# Patient Record
Sex: Female | Born: 1935 | Race: Black or African American | Hispanic: No | Marital: Married | Smoking: Never smoker
Health system: Southern US, Community
[De-identification: ages and names within clinical notes are randomized; demographics above are authoritative.]

## PROBLEM LIST (undated history)

## (undated) DIAGNOSIS — I639 Cerebral infarction, unspecified: Secondary | ICD-10-CM

## (undated) DIAGNOSIS — R05 Cough: Secondary | ICD-10-CM

## (undated) DIAGNOSIS — S01119A Laceration without foreign body of unspecified eyelid and periocular area, initial encounter: Secondary | ICD-10-CM

## (undated) DIAGNOSIS — I10 Essential (primary) hypertension: Secondary | ICD-10-CM

## (undated) DIAGNOSIS — F411 Generalized anxiety disorder: Secondary | ICD-10-CM

## (undated) DIAGNOSIS — R296 Repeated falls: Secondary | ICD-10-CM

## (undated) DIAGNOSIS — Z993 Dependence on wheelchair: Secondary | ICD-10-CM

## (undated) DIAGNOSIS — Z029 Encounter for administrative examinations, unspecified: Secondary | ICD-10-CM

## (undated) DIAGNOSIS — E785 Hyperlipidemia, unspecified: Secondary | ICD-10-CM

## (undated) DIAGNOSIS — F02818 Dementia in other diseases classified elsewhere, unspecified severity, with other behavioral disturbance: Secondary | ICD-10-CM

## (undated) DIAGNOSIS — K2100 Gastro-esophageal reflux disease with esophagitis, without bleeding: Secondary | ICD-10-CM

## (undated) DIAGNOSIS — R5381 Other malaise: Secondary | ICD-10-CM

## (undated) DIAGNOSIS — R5383 Other fatigue: Secondary | ICD-10-CM

## (undated) DIAGNOSIS — R059 Cough, unspecified: Secondary | ICD-10-CM

## (undated) DIAGNOSIS — N289 Disorder of kidney and ureter, unspecified: Secondary | ICD-10-CM

## (undated) DIAGNOSIS — I6990 Unspecified sequelae of unspecified cerebrovascular disease: Secondary | ICD-10-CM

## (undated) DIAGNOSIS — R251 Tremor, unspecified: Secondary | ICD-10-CM

## (undated) DIAGNOSIS — E876 Hypokalemia: Secondary | ICD-10-CM

## (undated) DIAGNOSIS — E559 Vitamin D deficiency, unspecified: Secondary | ICD-10-CM

## (undated) DIAGNOSIS — F0390 Unspecified dementia without behavioral disturbance: Secondary | ICD-10-CM

## (undated) DIAGNOSIS — Z0000 Encounter for general adult medical examination without abnormal findings: Secondary | ICD-10-CM

## (undated) DIAGNOSIS — F0281 Dementia in other diseases classified elsewhere with behavioral disturbance: Secondary | ICD-10-CM

## (undated) DIAGNOSIS — R269 Unspecified abnormalities of gait and mobility: Secondary | ICD-10-CM

## (undated) DIAGNOSIS — F0280 Dementia in other diseases classified elsewhere without behavioral disturbance: Secondary | ICD-10-CM

## (undated) DIAGNOSIS — S06339A Contusion and laceration of cerebrum, unspecified, with loss of consciousness of unspecified duration, initial encounter: Secondary | ICD-10-CM

## (undated) DIAGNOSIS — K921 Melena: Secondary | ICD-10-CM

## (undated) DIAGNOSIS — D696 Thrombocytopenia, unspecified: Secondary | ICD-10-CM

## (undated) DIAGNOSIS — R634 Abnormal weight loss: Secondary | ICD-10-CM

## (undated) HISTORY — DX: Tremor, unspecified: R25.1

## (undated) HISTORY — DX: Thrombocytopenia, unspecified: D69.6

## (undated) HISTORY — DX: Gastro-esophageal reflux disease with esophagitis: K21.0

## (undated) HISTORY — DX: Other malaise: R53.83

## (undated) HISTORY — DX: Abnormal weight loss: R63.4

## (undated) HISTORY — DX: Vitamin D deficiency, unspecified: E55.9

## (undated) HISTORY — DX: Essential (primary) hypertension: I10

## (undated) HISTORY — DX: Generalized anxiety disorder: F41.1

## (undated) HISTORY — DX: Other malaise: R53.81

## (undated) HISTORY — DX: Encounter for administrative examinations, unspecified: Z02.9

## (undated) HISTORY — DX: Dementia in other diseases classified elsewhere, unspecified severity, without behavioral disturbance, psychotic disturbance, mood disturbance, and anxiety: F02.80

## (undated) HISTORY — DX: Repeated falls: R29.6

## (undated) HISTORY — DX: Unspecified abnormalities of gait and mobility: R26.9

## (undated) HISTORY — DX: Laceration without foreign body of unspecified eyelid and periocular area, initial encounter: S01.119A

## (undated) HISTORY — DX: Dependence on wheelchair: Z99.3

## (undated) HISTORY — DX: Hypokalemia: E87.6

## (undated) HISTORY — DX: Gastro-esophageal reflux disease with esophagitis, without bleeding: K21.00

## (undated) HISTORY — DX: Cough, unspecified: R05.9

## (undated) HISTORY — DX: Dementia in other diseases classified elsewhere, unspecified severity, with other behavioral disturbance: F02.818

## (undated) HISTORY — DX: Dementia in other diseases classified elsewhere with behavioral disturbance: F02.81

## (undated) HISTORY — DX: Melena: K92.1

## (undated) HISTORY — DX: Cough: R05

## (undated) HISTORY — DX: Disorder of kidney and ureter, unspecified: N28.9

## (undated) HISTORY — DX: Hyperlipidemia, unspecified: E78.5

## (undated) HISTORY — DX: Unspecified sequelae of unspecified cerebrovascular disease: I69.90

## (undated) HISTORY — DX: Encounter for general adult medical examination without abnormal findings: Z00.00

## (undated) HISTORY — DX: Unspecified dementia, unspecified severity, without behavioral disturbance, psychotic disturbance, mood disturbance, and anxiety: F03.90

## (undated) HISTORY — DX: Contusion and laceration of cerebrum, unspecified, with loss of consciousness of unspecified duration, initial encounter: S06.339A

---

## 1979-03-26 HISTORY — PX: ABDOMINAL HYSTERECTOMY: SHX81

## 1999-05-28 ENCOUNTER — Ambulatory Visit (HOSPITAL_COMMUNITY): Admission: RE | Admit: 1999-05-28 | Discharge: 1999-05-28 | Payer: Self-pay | Admitting: Family Medicine

## 1999-05-28 ENCOUNTER — Encounter: Payer: Self-pay | Admitting: Family Medicine

## 2000-06-25 ENCOUNTER — Encounter: Payer: Self-pay | Admitting: Emergency Medicine

## 2000-06-25 ENCOUNTER — Emergency Department (HOSPITAL_COMMUNITY): Admission: EM | Admit: 2000-06-25 | Discharge: 2000-06-25 | Payer: Self-pay | Admitting: Emergency Medicine

## 2001-09-09 ENCOUNTER — Encounter: Payer: Self-pay | Admitting: Emergency Medicine

## 2001-09-09 ENCOUNTER — Emergency Department (HOSPITAL_COMMUNITY): Admission: EM | Admit: 2001-09-09 | Discharge: 2001-09-09 | Payer: Self-pay | Admitting: Emergency Medicine

## 2002-09-20 ENCOUNTER — Ambulatory Visit (HOSPITAL_COMMUNITY): Admission: RE | Admit: 2002-09-20 | Discharge: 2002-09-20 | Payer: Self-pay | Admitting: Family Medicine

## 2002-09-20 ENCOUNTER — Encounter: Payer: Self-pay | Admitting: Family Medicine

## 2003-05-26 ENCOUNTER — Encounter: Admission: RE | Admit: 2003-05-26 | Discharge: 2003-05-26 | Payer: Self-pay | Admitting: Family Medicine

## 2003-06-08 ENCOUNTER — Ambulatory Visit (HOSPITAL_COMMUNITY): Admission: RE | Admit: 2003-06-08 | Discharge: 2003-06-08 | Payer: Self-pay | Admitting: General Surgery

## 2003-06-08 ENCOUNTER — Encounter (INDEPENDENT_AMBULATORY_CARE_PROVIDER_SITE_OTHER): Payer: Self-pay | Admitting: *Deleted

## 2004-01-28 ENCOUNTER — Inpatient Hospital Stay (HOSPITAL_COMMUNITY): Admission: EM | Admit: 2004-01-28 | Discharge: 2004-02-01 | Payer: Self-pay | Admitting: Emergency Medicine

## 2004-11-26 ENCOUNTER — Emergency Department (HOSPITAL_COMMUNITY): Admission: EM | Admit: 2004-11-26 | Discharge: 2004-11-27 | Payer: Self-pay | Admitting: Emergency Medicine

## 2004-12-10 ENCOUNTER — Ambulatory Visit (HOSPITAL_COMMUNITY): Admission: RE | Admit: 2004-12-10 | Discharge: 2004-12-10 | Payer: Self-pay | Admitting: Specialist

## 2004-12-10 ENCOUNTER — Encounter (INDEPENDENT_AMBULATORY_CARE_PROVIDER_SITE_OTHER): Payer: Self-pay | Admitting: *Deleted

## 2004-12-10 ENCOUNTER — Ambulatory Visit (HOSPITAL_BASED_OUTPATIENT_CLINIC_OR_DEPARTMENT_OTHER): Admission: RE | Admit: 2004-12-10 | Discharge: 2004-12-10 | Payer: Self-pay | Admitting: Specialist

## 2006-03-25 HISTORY — PX: JOINT REPLACEMENT: SHX530

## 2006-06-10 ENCOUNTER — Encounter: Admission: RE | Admit: 2006-06-10 | Discharge: 2006-06-10 | Payer: Self-pay | Admitting: Family Medicine

## 2006-08-26 ENCOUNTER — Inpatient Hospital Stay (HOSPITAL_COMMUNITY): Admission: RE | Admit: 2006-08-26 | Discharge: 2006-08-29 | Payer: Self-pay | Admitting: Orthopedic Surgery

## 2007-04-18 ENCOUNTER — Inpatient Hospital Stay (HOSPITAL_COMMUNITY): Admission: EM | Admit: 2007-04-18 | Discharge: 2007-04-22 | Payer: Self-pay | Admitting: Emergency Medicine

## 2007-04-21 ENCOUNTER — Ambulatory Visit: Payer: Self-pay | Admitting: Physical Medicine & Rehabilitation

## 2007-04-22 ENCOUNTER — Inpatient Hospital Stay (HOSPITAL_COMMUNITY)
Admission: RE | Admit: 2007-04-22 | Discharge: 2007-05-08 | Payer: Self-pay | Admitting: Physical Medicine & Rehabilitation

## 2007-04-22 ENCOUNTER — Ambulatory Visit: Payer: Self-pay | Admitting: Physical Medicine & Rehabilitation

## 2007-05-14 ENCOUNTER — Encounter
Admission: RE | Admit: 2007-05-14 | Discharge: 2007-07-09 | Payer: Self-pay | Admitting: Physical Medicine & Rehabilitation

## 2007-05-20 ENCOUNTER — Encounter: Admission: RE | Admit: 2007-05-20 | Discharge: 2007-05-20 | Payer: Self-pay | Admitting: Neurology

## 2007-06-04 ENCOUNTER — Ambulatory Visit: Payer: Self-pay | Admitting: Physical Medicine & Rehabilitation

## 2007-06-04 ENCOUNTER — Encounter
Admission: RE | Admit: 2007-06-04 | Discharge: 2007-06-05 | Payer: Self-pay | Admitting: Physical Medicine & Rehabilitation

## 2007-07-31 ENCOUNTER — Ambulatory Visit: Payer: Self-pay | Admitting: Physical Medicine & Rehabilitation

## 2007-07-31 ENCOUNTER — Encounter
Admission: RE | Admit: 2007-07-31 | Discharge: 2007-07-31 | Payer: Self-pay | Admitting: Physical Medicine & Rehabilitation

## 2007-10-05 ENCOUNTER — Emergency Department (HOSPITAL_COMMUNITY): Admission: EM | Admit: 2007-10-05 | Discharge: 2007-10-05 | Payer: Self-pay | Admitting: Emergency Medicine

## 2008-11-06 ENCOUNTER — Emergency Department (HOSPITAL_COMMUNITY): Admission: EM | Admit: 2008-11-06 | Discharge: 2008-11-06 | Payer: Self-pay | Admitting: Emergency Medicine

## 2009-09-25 ENCOUNTER — Emergency Department (HOSPITAL_COMMUNITY): Admission: EM | Admit: 2009-09-25 | Discharge: 2009-09-25 | Payer: Self-pay | Admitting: Emergency Medicine

## 2010-07-01 LAB — DIFFERENTIAL
Basophils Absolute: 0 10*3/uL (ref 0.0–0.1)
Lymphocytes Relative: 22 % (ref 12–46)
Monocytes Absolute: 0.4 10*3/uL (ref 0.1–1.0)
Monocytes Relative: 8 % (ref 3–12)
Neutro Abs: 3.4 10*3/uL (ref 1.7–7.7)

## 2010-07-01 LAB — CBC
HCT: 39.9 % (ref 36.0–46.0)
MCHC: 33.6 g/dL (ref 30.0–36.0)
MCV: 88.9 fL (ref 78.0–100.0)
Platelets: 124 10*3/uL — ABNORMAL LOW (ref 150–400)
RDW: 13.4 % (ref 11.5–15.5)

## 2010-07-01 LAB — COMPREHENSIVE METABOLIC PANEL
AST: 22 U/L (ref 0–37)
Albumin: 4.4 g/dL (ref 3.5–5.2)
Calcium: 9.6 mg/dL (ref 8.4–10.5)
Creatinine, Ser: 0.98 mg/dL (ref 0.4–1.2)

## 2010-07-01 LAB — URINE MICROSCOPIC-ADD ON

## 2010-07-01 LAB — POCT CARDIAC MARKERS: Myoglobin, poc: 88.8 ng/mL (ref 12–200)

## 2010-07-01 LAB — URINALYSIS, ROUTINE W REFLEX MICROSCOPIC
Bilirubin Urine: NEGATIVE
Hgb urine dipstick: NEGATIVE
Nitrite: NEGATIVE
Specific Gravity, Urine: 1.011 (ref 1.005–1.030)
pH: 7 (ref 5.0–8.0)
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
## 2010-08-07 NOTE — Discharge Summary (Signed)
NAMEBULAH, LURIE NO.:  [id number]   MEDICAL RECORD NO.:  [id number]          PATIENT TYPE:  INP   LOCATION:  3028                         FACILITY:  [facility]   PHYSICIAN:  Pramod P. Pearlean Brownie, MD    DATE OF BIRTH:  Aug 04, 1928   DATE OF ADMISSION:  04/18/2007  DATE OF DISCHARGE:  04/22/2007                               DISCHARGE SUMMARY   DIAGNOSES AT THE TIME OF DISCHARGE:  1. Multiple left parietal cortical hemorrhages, likely cavernomas with      expressive aphasia.  2. Focal seizures.  3. Hypokalemia, resolved.  4. Hypertension.  5. Degenerative arthritis.  6. Hyperlipidemia.  7. Anxiety.  8. History of total knee arthroplasty.  9. Alzheimer's dementia, followed by Dr. Sandria Manly.   MEDICINES AT THE TIME OF DISCHARGE:  Lipitor 20 mg a day, Maxzide  37.5/25 mg a day, Xanax 0.5 mg b.i.d., Exelon patch 9.5 mg q.24h.,  Lovenox 40 mg subcu daily,  Protonix 40 mg a day, Keppra 500 mg q.12h.   STUDIES PERFORMED:  1. CT of the brain on admission shows 3 foci of acute parenchymal      hemorrhage in the left hemisphere involving the left superior      temporal gyrus and the left perisylvian cortex.  Associated edema      but no mass effect or extra-axial hemorrhage.  Interestingly,      smaller parenchymal hemorrhages were seen in the same region in      2005 and were at that time attributed to trauma.  Increased chronic      sphenoid sinus disease.   MRI of the brain shows 3 left cerebral acute parenchymal hemorrhages,  stable.  There is also an occult chronic hemorrhage in the left frontal  lobe of similar size which resembles a cavernous malformation.  There is  a developmental venous anomaly associated with a larger acute  hemorrhage.  Diagnosis of cavernomas is favored.   MRA of the head shows no evidence of AV malformation in the region of  the posterior left temporal lobe hematoma.  Intracranial vessels  visualized are normal.   Followup CT of the brain 24  hours shows stable 3 left cerebral acute  hemorrhages, likely due to multiple cavernous malformations on the basis  of prior MRI.   Ankle x-ray on the right shows no acute abnormality.   Carotid Doppler, transcranial Doppler, 2-D echocardiogram were not  performed.   EKG normal sinus rhythm, left axis deviation, anteroseptal infarct, age  undetermined.   LABORATORY STUDIES:  CBC with hemoglobin 11.5, hematocrit 35.9,  otherwise normal.  Chemistry normal.  Coagulation studies normal.  Liver  function tests normal.  Cardiac enzymes not performed.  Lipid panel not  performed.  Urinalysis with 3 to 6 white blood cells, 7 to 10 red blood  cells, small leukocyte esterase, otherwise normal.   HISTORY OF PRESENT ILLNESS:  Tracie [name] is a 77 year old African-American  female who developed some speech difficulties which were noted the day  prior to admission.  The morning of admission when she woke up she was  not walking right and  her balance was off, so her daughters brought her  to the emergency room.  In the emergency room a CT scan of the head  showed 3 small hemorrhages in the left parietal and temporal cortex,  prompting a neurologic consult.  The patient denied any headache or  focal weakness on the right side; however, her speech difficulties  persisted.  Her past neurological history is significant for Alzheimer's  dementia, for which she is followed by Dr. Sandria Manly.  She was started on  Exelon patch in June of last year and has done well.  She continues to  have short-term memory difficulties, but she is managed at home by her  family, who supervise her most of the time.  She has no other history of  seizures or strokes.  She was admitted to the hospital for further  evaluation.  She was not a t-PA candidate secondary to time of onset  being greater than 3 hours.   HOSPITAL COURSE:  MRI was positive for also acute left parietal  hemorrhages x3 with also temporal hemorrhage as above.   The hemorrhages  were felt to be secondary to cavernomas.  The patient also had focal  seizures at onset with right focal jerking of her neck and upper  extremity.  She was placed on Keppra and these resolved.  She was  initially very lethargic,  but after Keppra and her postictal phase she  was much more alert and interactive.  She still remains with significant  expressive aphasia though her social speech is improving.  She was  evaluated by PT, OT and speech and felt to benefit from rehab as an  inpatient.  She has family support 24-hour care at home.  Therefore she  was transferred for therapy.   CONDITION ON DISCHARGE:  The patient alert and oriented x3.  No aphasia.  No dysarthria.  Eye  movements are full.  Her right face is asymmetric.  She has minimal right upper extremity drift.  Her right grip is weak.  Her right leg is 4/5 in strength.  Her finger-nose-finger, heel-shin  remain abnormal though improved.   DISCHARGE/PLAN:  1. Discharge to rehab for continuation of PT, OT and speech therapy.  2. No antiplatelets, NSAIDS, etc. secondary to cavernomas.  3. Follow up with primary care physician for ongoing risk factor      control.  4. Follow up with Dr. Janalyn Shy P. Sethi in 2 to 3 months.      Tracie [name], N.P.    ______________________________  Sunny Schlein. Pearlean Brownie, MD    SB/MEDQ  D:  04/22/2007  T:  04/22/2007  Job:  [id number]   cc:   Pramod P. Pearlean Brownie, MD  Dr. Clyda Greener

## 2010-08-07 NOTE — Assessment & Plan Note (Signed)
Tracie [name] returns to clinic today accompanied by her daughter.  The  patient is a 77 year old African American female with a history of  hypertension and Alzheimer's dementia.  She was admitted April 18, 2007, with speech problems and difficulty with ambulation.  CT of the  brain showed 3 areas of acute hemorrhage in the left hemisphere with  mass effect.  MRI study of the brain showed local chronic hemorrhage of  the left frontal lobe resembling a cavernous malformation.  MRA study of  the brain showed no AVM.  It was felt the patient had a left parietal  cavernoma causing cortical hemorrhage.  She was also noted to have focal  seizure activity involving the right face and right arm after admission.  She was started on Keppra for seizure prophylaxis.  She was eventually  stabilized and was moved to the rehabilitation unit April 22, 2007,  remain there through discharge May 08, 2007.  Since discharge,  patient is attending outpatient therapies including speech,  occupational, and physical therapy at the [facility] address.  She is  continent in bowel and bladder, requires help with bathing but is mostly  independent with dressing.  She requires moderate assist for household  management and is modified independent with a cane in and outside the  home for ambulation.  She did follow up with ophthalmology with a future  scheduled appointment for June 29, 2007.  She lives with her husband and  one of her daughters and has a paid caregiver in the home approximately  5 hours per day.   MEDICATIONS:  1. Lipitor 20 mg.  2. Keppra 500 mg p.o. b.i.d.  3. Xanax 0.5 mg b.i.d. p.r.n.  4. Maxzide 37.5/25 one tablet.  5. K-Dur 20 mEq b.i.d.  6. Exelon patch daily.   REVIEW OF SYSTEMS:  Noncontributory.   PHYSICAL EXAMINATION:  Well-appearing elderly adult female with  significant dementia.  Blood pressure is 144/70 with a pulse of 80,  respiratory rate 18 and O2 saturation 95% on  room air.  She ambulates  without any assisted device but needs direction.  She is unable to state  her birth date nor her children's names without significant queues.  She  also has trouble naming her husband.  Strength was 4+/5 throughout the  bilateral upper and lower extremities.   IMPRESSION:  1. Status post left frontal infarct.  2. Alzheimer's dementia.  3. History of recent seizure disorder.  4. Hypertension.  5. Dyslipidemia.   In the office today, no refill on medication is necessary.  She will  continue in outpatient therapies and we will plan on seeing her in  followup in approximately 2-3 months' time.           ______________________________  Ellwood Dense, M.D.     DC/MedQ  D:  06/05/2007 15:06:59  T:  06/05/2007 22:33:48  Job #:  [id number]

## 2010-08-07 NOTE — Op Note (Signed)
[name], [name] NO.:  [id number]   MEDICAL RECORD NO.:  [id number]          PATIENT TYPE:  INP   LOCATION:  0002                         FACILITY:  [facility]   PHYSICIAN:  Deidre Ala, M.D.    DATE OF BIRTH:  1928/04/06   DATE OF PROCEDURE:  08/26/2006  DATE OF DISCHARGE:                               OPERATIVE REPORT   PREOPERATIVE DIAGNOSIS:  End-stage degenerative joint disease and  osteoarthritis, right knee, with valgus.   POSTOPERATIVE DIAGNOSIS:  End-stage degenerative joint disease and  osteoarthritis, right knee, with valgus.   PROCEDURE:  Right total knee arthroplasty using cemented DePuy  components, LCS type with rotating platform and M.B.T. revision stem.   SURGEON:  Doristine Section, MD   ASSISTANT:  Phineas Semen, PA-C   ANESTHESIA:  General endotracheal with preoperative femoral nerve block.   CULTURES:  None.   DRAINS:  None.   BLOOD LOSS:  Less than 100 mL.   REPLACEMENT:  Without.   TOURNIQUET TIME:  Seventy-five minutes.   PATHOLOGIC FINDINGS AND HISTORY:  Tracie [name] was sent for consultation by  Dr. Angelita Ingles. [name].  She is a 77 year old in relatively good health with  end-stage DJD of both knees.  A cortisone injection was not helpful in  the long-term and she elected to proceed with surgical intervention.  She had about 10 degrees of valgus, but no obvious medial collateral  ligament instability on stance or stressing.  She was noted bone-into-  bone in the lateral compartment.  At joint, she had markedly eburnated  bone laterally and tricompartmental DJD.  Fortunately, ,we were able to  get her to an excellent overall 4-5 degrees of valgus maximum with  excellent stability of the ligaments, full extension, flexion to about  115 degrees.  We used a 15-mm rotating platform, a standard plus femur,  A #3 tibial tray with a 75 x 16-mm stem and a 32-mm all-poly 3-peg  patella button.  We had full extension with good patellar glide.  Tobramycin was used in the cement, 2 batches of cement with 2 doses of  tobramycin.   PROCEDURE:  With adequate anesthesia obtained using endotracheal  technique with 1 gram Ancef given IV prophylaxis and another one at  tourniquet letdown, the patient was placed in the supine position.  The  right lower extremity was prepped from the toes to the knee in the  standard fashion.  We did scrub prior to DuraPrep.  After standard  prepping and draping, Esmarch exsanguination was used and the tourniquet  was let up to 350 mmHg.  We then made a median parapatellar skin  incision.  Incision was deepened sharply with a knife and hemostasis  obtained using the Bovie electrocoagulator.  Dissection was carried down  further, where a median parapatellar retinacular incision was made.  The  patella was everted, fat pad excised and osteophytes removed.  We  removed the cruciates as well as both menisci.  There was marked deep  dishing of the lateral compartment.  We did then use the saw to  flattened the tibial spines.  We  centered the intermedullary drill for  the tibial component using a #2 tray.  We then reamed up to a 16, placed  the tibial cutting jig in place, used the stylus to measure our cut  laterally, which was the deepest side.  We then made that cut.  We then  sized the femur to a standard plus, placed the anterior-posterior  cutting jig in place with the intermedullary guide and wet it with the  17.5, pinned it, made the anterior and posterior cuts and then it fit a  15 mm in flexion; this was the 15-mm spacer.  We then placed a 4-degrees  distal femoral cutting jig in place.  We measured it for the 15 and then  made the cut with the first position and we fit perfectly 15 mm in  extension, 15 mm in flexion.  The finishing guide was then put in place.  The anterior, posterior and chamfer cuts and notch cut were made.  We  then adjust it slightly medial.  We then exposed the proximal tibia  and  sized to a 3.  We had reamed the central peg and down the canal with the  stem, 75 x 16, then impacted it with the broach for the keel and had our  trial tibial component in place with the 15 placed on the same standard  plus femur and articulated through a range of motion.  The patella was  then measured with a caliper to 21.  We cut it down to a 14, replacing  8, using a 32-mm button.  We cut it with the guide and then measured  again and then placed the 3-peg template in place, made those holes,  placed a trial patella and placed an articulator through the range of  motion.  Trial components were then removed while we checked the  implantable components for sizing as they came on the field.  Cement was  then mixed with tobramycin and we then thoroughly jet-lavaged the knee.  After assembling the tibial component and stem, we then cemented in the  tibial component, impacted it, removed excess cement.  We then placed  the rotating platform, 15 mm.  We then cemented on the femoral  component, impacted it and removed excess cement, held the knee in full  extension, removed excess cement and then cemented on the patella  component, impacted it, removed excess cement and held it with a clamp  until the cement had cured.  When the cement had set, the tourniquet was  let down and bleeding points were cauterized.  Hemovac drains were  then  placed through the medial and lateral gutter and pulled out through the  superolateral portal.  The wound then had 10 mL of FloSeal put in place  for hemostasis and we did not feel we needed an Autovac, so Hemovac was  later hooked up to self-suction.  The wound was then closed in layers  with #1 Vicryl figure-of-eight interrupted on the retinaculum and then a  running-locking oversew of #1 PDS; 0, 2-0 and 3-0 Vicryl was used on the subcu and skin staples.  Hemovac was hooked up to Autovac.  A bulky  sterile compressive dressing was applied with Webril  and Ace.  The knee  was then placed in a knee immobilizer.  The patient then having  tolerated the procedure well, was awakened and taken to the recovery  room in satisfactory condition for routine postoperative care, CPM and  analgesia.  ______________________________  V. Charlesetta Shanks, M.D.     VEP/MEDQ  D:  08/26/2006  T:  08/26/2006  Job:  [id number]   cc:   Zenaida Deed MD

## 2010-08-07 NOTE — Discharge Summary (Signed)
Tracie [name], CUTBIRTH NO.:  [id number]   MEDICAL RECORD NO.:  [id number]          PATIENT TYPE:  INP   LOCATION:  1615                         FACILITY:  [facility]   PHYSICIAN:  Deidre Ala, M.D.    DATE OF BIRTH:  May 31, 1928   DATE OF ADMISSION:  08/26/2006  DATE OF DISCHARGE:  08/29/2006                               DISCHARGE SUMMARY   FINAL DIAGNOSES:  1. End-stage degenerative joint disease, right knee.  2. Mild confusion.  3. Hypertension.  4. Hyperlipidemia.  5. Anxiety.   HISTORY:  A 77 year old African-American female with end-stage DJD,  right knee.  She has been followed by Dr. Renae Fickle.  The pain is getting  worse over recent weeks.  Subsequently, she wants to have a total knee  arthroplasty done on the right knee.  She has pain with walking, pain at  night, and she cannot walk more than a block without resting.  She is  subsequently scheduled for total knee arthroplasty.   HOSPITAL COURSE:  Patient was admitted on August 26, 2006.  At that time,  she underwent a total knee arthroplasty of the right knee.  She  tolerated the procedure well.  No intraoperative complications occurred.  Postoperatively, the patient did relatively well.  Overall, lab work  remained satisfactory.  She did have postoperative acute blood loss  anemia which was stable and remained stable throughout her stay.  She  did have little episodes of confusion and sundowning.  On the evening of  the operative date, she did try to get out of bed and she fell, but she  did not hurt herself.  Her knee was not injured in any way.  After that,  we had the hospitalist see her, and they saw her.  They noted at that  time, the following day, her confusion was improving.  This is sort of  natural for an elderly person to undergo these type of problems during  hospitalization in a strange place.  Overall, though, she became more  appropriate.  Her family did note that she had these episodes of  confusion at times at home as well.  She probably has signs of early  dementia.  Overall, she continued to do well.  She worked with physical  therapy.  She was up and walking with physical therapy and by August 29, 2006, she was able to walk without assistance and used the walker.  At  this point, she was appropriate to go home.  The family was acceptable  to this, and they wanted her to go home.  So we are to discharge her at  this time.  She was on Lovenox.  She will continue on Lovenox for eight  more days.  She will follow up with Dr. Renae Fickle in 10 days.  She is given  Vicodin for pain, 1-2 p.o. q.4-6h. p.r.n. pain, 40 of these, with no  refills.  Her wound at the time of discharge was clean and dry with no  sign of infection.  She has good range of motion of her knees.  She  was  overall doing well.  Peripheral pulses are intact.  Neuro was grossly  intact.  At this point, she was discharged to home in satisfactory and  stable condition on August 29, 2006.   Of note, she was on hydrochlorothiazide, which was DC'd by the  hospitalist, and she will continue just on her atenolol for her blood  pressure control.  She will follow up with a regular doctor as  appropriate.      Phineas Semen, P.A.    ______________________________  Seth Bake. Charlesetta Shanks, M.D.    CL/MEDQ  D:  08/29/2006  T:  08/30/2006  Job:  [id number]

## 2010-08-07 NOTE — Assessment & Plan Note (Signed)
Tracie [name] returns to the clinic today accompanied by her daughter. The  patient is a 77 year old African American female with history of  hypertension and Alzheimer disease. She also suffered a recent left  frontal infarct. She remains on Keppra at 500 mg twice a day for seizure  prophylaxis. She also has been started on unknown dementia medicine by  Dr. Sandria Manly. She also continues to take Xanax approximately twice a day as  needed.   The patient has finished all therapy at this time. She does attend adult  day care q. Tuesday, Wednesday, and Thursday. Her daughter is reporting  poor sleep which is a problem since her husband is still working and  needs to get up each morning. They are interested in restarting Benadryl  over the counter which she has used as Tylenol P.M. in the past.   MEDICATIONS:  1. Lipitor 20 mg every day.  2. Keppra 500 mg b.i.d.  3. Xanax 0.5 mg b.i.d. p.r.n.  4. Maxzide 37.5/25 one tablet every day.  5. K-Dur 20 mEq b.i.d.  6. Exelon patch daily.   REVIEW OF SYSTEMS:  Noncontributory.   PHYSICAL EXAMINATION:  GENERAL:  Well-appearing elderly small adult  female with significant dementia. She ambulates with hand-held  assistance. She has at least 4+/5 strength throughout the bilateral  upper and lower extremities.  VITAL SIGNS:  Not obtained.   IMPRESSION:  1. Status post left frontal infarct.  2. Alzheimer dementia.  3. History of recent seizure disorder.  4. Hypertension.  5. Dyslipidemia.   In the office today, we did recommend over-the-counter diphenhydramine  for the patient at 25 mg strength to be used one-half tablet to 1 tablet  nightly p.r.n. That is over-the-counter medication not needing a  prescription. We will plan on seeing the patient in followup on an as  needed basis. She will be followed by her primary care physician and Dr.  Sandria Manly, her neurologist. We will plan on seeing the patient as noted  above.     ______________________________  Ellwood Dense, M.D.     DC/MedQ  D:  07/31/2007 13:28:09  T:  07/31/2007 13:52:27  Job #:  [id number]

## 2010-08-07 NOTE — Consult Note (Signed)
NAMELAQUITTA, DOMINSKI NO.:  [id number]   MEDICAL RECORD NO.:  [id number]          PATIENT TYPE:  INP   LOCATION:  1615                         FACILITY:  [facility]   PHYSICIAN:  Thomasenia Bottoms, MDDATE OF BIRTH:  [birth year]   DATE OF CONSULTATION:  08/28/2006  DATE OF DISCHARGE:                                 CONSULTATION   CONSULTING PHYSICIAN:  Dr. Charlesetta Shanks.   REASON FOR CONSULTATION:  Confusion or delirium.   MEDICAL ASSESSMENT:  1. Delirium.  2. Posthemorrhagic or post blood loss anemia.  3. Orthostatic hypotension likely secondary to #2.  4. History of hypertension.  5. History of left breast intraductal papillomatosis.   RECOMMENDATIONS:  1. Acute delirium:  The patient has been confused since surgery per      nurses notes and the progress notes.  The patient fell out of bed      the evening after surgery and has been quite confused and her      family has been very worried about her.  This evening, however, by      the time of my evaluation, the patient is improving.  She is      oriented and appropriate.  She is able to relate details of her      past medical history.  She notes who her primary care physician.      She knows exactly why she is here in the hospital and knows that      the surgery that transpired earlier.  Her husband is at the bedside      and agrees that she is doing much better this afternoon and      actually was already starting to improve early this morning.  She      had no episodes of significant confusion all day today.  She has no      abnormal neurologic findings either.  So at this point, I recommend      just continuing to follow the patient.  We will try to minimize her      narcotics as much as possible.  I do not think she needs an MRI at      this juncture.  We will continue to follow.  2. Anemia.  The patient's hemoglobin was 12.9 prior to surgery and is      9.0 today.  She was orthostatic with a systolic blood  pressure of      70 once she was stood up.  She is not tachycardiac and is not      hypotensive otherwise.  3. History of nontender.  The patient's antihypertensive medications      will be held until her transfusions are complete.  4. SATs, which were difficult to obtain.  The patient's O2 SATs by      pulse oximetry were difficult to obtain.  At one point, they were      97% on room air, but because of the difficulty obtaining them, an      ABG was obtained; it reveal a pH of 7.52, pCO2 of 30, pO2  of 125;      that was on O2, but it is not clear how much.  Her O2 SATs at that      time were 99.5, so the patient is find to remain on room air and      that most likely suggests difficulty in obtaining the SATs, but she      does not have any sign of being hypoxic.   PHYSICAL EXAMINATION:  VITAL SIGNS:  The patient's temperature was 98.1,  pulse 90, respiratory rate 16, blood pressure has been 129/77 and  standing was 72/50.  Blood sugars have been 123-143.  GENERAL:  On physical examination, she is a pleasant woman in no acute  distress.  HEENT:  Normocephalic, atraumatic.  Her pupils are equal.  Her sclerae  are anicteric.  Oral mucosa moist.  NECK:  Supple.  No lymphadenopathy, no thyromegaly, no No jugular venous  distention.  CARDIAC:  Regular rate and rhythm.  No murmurs, gallops or rubs.  LUNGS:  Clear to auscultation bilaterally.  She has excellent breath  sounds.  No wheezes, rhonchi or rales.  ABDOMEN:  Soft, nontender and nondistended.  Normoactive bowel sounds.  No masses are appreciated.  EXTREMITIES:  She has a bulky Ace wrap bandage over her right knee.  Her  DP pulses are palpable bilaterally.  NEUROLOGICAL:  She is alert and oriented.  She is cooperative and  appropriate.  She follows commands.  Cranial nerves II-XII are intact  grossly.  She moves each of her extremities to command, but further  assessment, particularly of the lower extremities is deferred because  of  her recent surgery.   REVIEW OF SYSTEMS:  Deferred because of the recent delirium.   ASSESSMENT AND RECOMMENDATIONS:  As mentioned above.  Currently, the  patient is on Colace 100 mg p.o. b.i.d., Lovenox 30 mg subcutaneously  q.24 h., ferrous sulfate 324 mg three times a day, potassium 40 mEq p.o.  now, Senokot one tablet p.o. b.i.d., Zocor 40 mg p.o. every evening.  We  will follow along with the patient.      Thomasenia Bottoms, MD  Electronically Signed     CVC/MEDQ  D:  08/28/2006  T:  08/29/2006  Job:  [id number]   cc:   Clyda Greener MD

## 2010-10-08 ENCOUNTER — Other Ambulatory Visit: Payer: Self-pay | Admitting: Neurology

## 2010-10-08 DIAGNOSIS — G40209 Localization-related (focal) (partial) symptomatic epilepsy and epileptic syndromes with complex partial seizures, not intractable, without status epilepticus: Secondary | ICD-10-CM

## 2010-10-08 DIAGNOSIS — R413 Other amnesia: Secondary | ICD-10-CM

## 2010-10-08 DIAGNOSIS — R4701 Aphasia: Secondary | ICD-10-CM

## 2010-10-10 ENCOUNTER — Ambulatory Visit
Admission: RE | Admit: 2010-10-10 | Discharge: 2010-10-10 | Disposition: A | Discharge status: Home / Self Care | Payer: Medicare Other | Source: Ambulatory Visit | Attending: Neurology | Admitting: Neurology

## 2010-10-10 DIAGNOSIS — R413 Other amnesia: Secondary | ICD-10-CM

## 2010-10-10 DIAGNOSIS — R4701 Aphasia: Secondary | ICD-10-CM

## 2010-10-10 DIAGNOSIS — G40209 Localization-related (focal) (partial) symptomatic epilepsy and epileptic syndromes with complex partial seizures, not intractable, without status epilepticus: Secondary | ICD-10-CM

## 2010-12-14 LAB — URINE CULTURE

## 2010-12-14 LAB — COMPREHENSIVE METABOLIC PANEL
ALT: 13
AST: 21
AST: 24
Albumin: 4.3
BUN: 7
CO2: 30
Calcium: 10.1
Chloride: 102
Chloride: 98
Creatinine, Ser: 0.85
Creatinine, Ser: 0.9
GFR calc Af Amer: >60
GFR calc non Af Amer: >60
Total Bilirubin: 0.7
Total Protein: 7.3

## 2010-12-14 LAB — URINALYSIS, ROUTINE W REFLEX MICROSCOPIC
Bilirubin Urine: NEGATIVE
Glucose, UA: NEGATIVE
Glucose, UA: NEGATIVE
Hgb urine dipstick: NEGATIVE
Ketones, ur: NEGATIVE
Nitrite: NEGATIVE
Nitrite: NEGATIVE
Protein, ur: NEGATIVE
Specific Gravity, Urine: 1.011
Specific Gravity, Urine: 1.021
Urobilinogen, UA: 1
Urobilinogen, UA: 1
Urobilinogen, UA: 2 — ABNORMAL HIGH
pH: 7

## 2010-12-14 LAB — CBC
HCT: 35.9 — ABNORMAL LOW
MCHC: 33
MCV: 86.8
MCV: 87.3
Platelets: 161
RBC: 4.58
RDW: 14.2
WBC: 5.6

## 2010-12-14 LAB — BASIC METABOLIC PANEL
BUN: 9
CO2: 26
CO2: 30
Calcium: 8.7
Chloride: 104
Chloride: 95 — ABNORMAL LOW
Creatinine, Ser: 0.8
Creatinine, Ser: 0.99
GFR calc Af Amer: >60
Glucose, Bld: 119 — ABNORMAL HIGH

## 2010-12-14 LAB — DIFFERENTIAL
Basophils Absolute: 0
Basophils Relative: 1
Eosinophils Absolute: 0.2
Eosinophils Relative: 4
Lymphocytes Relative: 19
Lymphocytes Relative: 26
Lymphs Abs: 0.9
Monocytes Absolute: 0.3
Monocytes Relative: 6
Neutro Abs: 3.6
Neutrophils Relative %: 75

## 2010-12-14 LAB — URINE MICROSCOPIC-ADD ON

## 2010-12-14 LAB — PROTIME-INR: INR: 0.9

## 2010-12-14 LAB — APTT: aPTT: 23 — ABNORMAL LOW

## 2010-12-20 LAB — CBC
HCT: 38.5
Hemoglobin: 12.9
MCHC: 33.4
MCV: 86.7
Platelets: 151
RBC: 4.44
RDW: 12.8
WBC: 6.4

## 2010-12-20 LAB — URINALYSIS, ROUTINE W REFLEX MICROSCOPIC
Bilirubin Urine: NEGATIVE
Glucose, UA: NEGATIVE
Hgb urine dipstick: NEGATIVE
Ketones, ur: NEGATIVE
Nitrite: NEGATIVE
Protein, ur: NEGATIVE
Specific Gravity, Urine: 1.021
Urobilinogen, UA: 1
pH: 7

## 2010-12-20 LAB — DIFFERENTIAL
Basophils Absolute: 0
Basophils Relative: 0
Eosinophils Absolute: 0
Eosinophils Relative: 0
Lymphocytes Relative: 11 — ABNORMAL LOW
Lymphs Abs: 0.7
Monocytes Absolute: 0.3
Monocytes Relative: 5
Neutro Abs: 5.4
Neutrophils Relative %: 84 — ABNORMAL HIGH

## 2010-12-20 LAB — COMPREHENSIVE METABOLIC PANEL
ALT: 21
AST: 105 — ABNORMAL HIGH
Albumin: 4.2
Alkaline Phosphatase: 92
BUN: 13
CO2: 28
Calcium: 9.5
Chloride: 92 — ABNORMAL LOW
Creatinine, Ser: 0.94
GFR calc Af Amer: >60
GFR calc non Af Amer: 58 — ABNORMAL LOW
Glucose, Bld: 155 — ABNORMAL HIGH
Potassium: 2.9 — ABNORMAL LOW
Sodium: 131 — ABNORMAL LOW
Total Bilirubin: 1.1
Total Protein: 7

## 2011-01-10 LAB — BASIC METABOLIC PANEL
CO2: 29
CO2: 29
Chloride: 101
Chloride: 101
Chloride: 105
Creatinine, Ser: 1.09
Creatinine, Ser: 1.6 — ABNORMAL HIGH
GFR calc Af Amer: 38 — ABNORMAL LOW
GFR calc Af Amer: 55 — ABNORMAL LOW
GFR calc Af Amer: 59 — ABNORMAL LOW
Potassium: 3.6
Potassium: 4.1
Sodium: 137
Sodium: 141

## 2011-01-10 LAB — CBC
HCT: 28.7 — ABNORMAL LOW
Hemoglobin: 10.5 — ABNORMAL LOW
MCHC: 33.7
MCHC: 34.4
MCV: 85.3
MCV: 85.8
MCV: 86
RBC: 3.12 — ABNORMAL LOW
RBC: 3.34 — ABNORMAL LOW
RBC: 3.57 — ABNORMAL LOW
WBC: 10.4
WBC: 12.6 — ABNORMAL HIGH

## 2011-01-10 LAB — CROSSMATCH

## 2011-01-10 LAB — BLOOD GAS, ARTERIAL
Acid-Base Excess: 2.6 — ABNORMAL HIGH
Bicarbonate: 25 — ABNORMAL HIGH
FIO2: 21
O2 Saturation: 99.5
Patient temperature: 98.6
TCO2: 23.2

## 2011-03-23 ENCOUNTER — Encounter: Payer: Self-pay | Admitting: Emergency Medicine

## 2011-03-23 ENCOUNTER — Emergency Department (HOSPITAL_COMMUNITY)
Admission: EM | Admit: 2011-03-23 | Discharge: 2011-03-23 | Disposition: A | Discharge status: Home / Self Care | Payer: Medicare Other | Attending: Emergency Medicine | Admitting: Emergency Medicine

## 2011-03-23 DIAGNOSIS — F0390 Unspecified dementia without behavioral disturbance: Secondary | ICD-10-CM | POA: Insufficient documentation

## 2011-03-23 DIAGNOSIS — Z8679 Personal history of other diseases of the circulatory system: Secondary | ICD-10-CM | POA: Insufficient documentation

## 2011-03-23 DIAGNOSIS — M79606 Pain in leg, unspecified: Secondary | ICD-10-CM

## 2011-03-23 DIAGNOSIS — I10 Essential (primary) hypertension: Secondary | ICD-10-CM | POA: Insufficient documentation

## 2011-03-23 DIAGNOSIS — M79609 Pain in unspecified limb: Secondary | ICD-10-CM | POA: Insufficient documentation

## 2011-03-23 DIAGNOSIS — E119 Type 2 diabetes mellitus without complications: Secondary | ICD-10-CM | POA: Insufficient documentation

## 2011-03-23 DIAGNOSIS — I739 Peripheral vascular disease, unspecified: Secondary | ICD-10-CM | POA: Insufficient documentation

## 2011-03-23 HISTORY — DX: Cerebral infarction, unspecified: I63.9

## 2011-03-23 HISTORY — DX: Essential (primary) hypertension: I10

## 2011-03-23 HISTORY — DX: Unspecified dementia, unspecified severity, without behavioral disturbance, psychotic disturbance, mood disturbance, and anxiety: F03.90

## 2011-03-23 NOTE — ED Notes (Signed)
Per family, pt c/o pain when walking. Pt is not specific on where the pain is in her legs. Symptoms ongoing for a couple of weeks increased within past couple of days. Pt has not been seen by PMD for same.

## 2011-03-29 NOTE — ED Provider Notes (Signed)
History    82yF with b/l leg pain. Has been going on for weeks. Pt with hx of CVA and has some problems with communication. Pain seems to be when ambulating. Husband hasn't noticed her complaining of it when sitting or when laying down. NO known trauma. No swelling. NO rash. No sob. No fever or chills.  CSN: [id number]  Arrival date & time 03/23/11  1148   First MD Initiated Contact with Patient 03/23/11 1251      Chief Complaint  Patient presents with  . Leg Pain    Pain with walking    (Consider location/radiation/quality/duration/timing/severity/associated sxs/prior treatment) HPI  Past Medical History  Diagnosis Date  . Stroke   . Dementia   . Hypertension   . Diabetes mellitus     Past Surgical History  Procedure Date  . Joint replacement   . Abdominal hysterectomy     History reviewed. No pertinent family history.  History  Substance Use Topics  . Smoking status: Never Smoker   . Smokeless tobacco: Never Used  . Alcohol Use: No    OB History    Grav Para Term Preterm Abortions TAB SAB Ect Mult Living                  Review of Systems   Review of symptoms negative unless otherwise noted in HPI.   Allergies  Review of patient's allergies indicates no known allergies.  Home Medications   Current Outpatient Rx  Name Route Sig Dispense Refill  . ATORVASTATIN CALCIUM 20 MG PO TABS Oral Take 20 mg by mouth daily.      Marland Kitchen DIVALPROEX SODIUM 125 MG PO TBEC Oral Take 125 mg by mouth daily.      Marland Kitchen LINAGLIPTIN 5 MG PO TABS Oral Take 5 mg by mouth daily.      Marland Kitchen MEMANTINE HCL 10 MG PO TABS Oral Take 10 mg by mouth daily.      Marland Kitchen OMEPRAZOLE 20 MG PO CPDR Oral Take 20 mg by mouth daily.      Marland Kitchen RIVASTIGMINE 9.5 MG/24HR TD PT24 Transdermal Place 1 patch onto the skin daily.      . TRIAMTERENE-HCTZ 37.5-25 MG PO TABS Oral Take 0.5 tablets by mouth daily.        BP 134/74  Pulse 76  Temp(Src) 97.8 F (36.6 C) (Oral)  Resp 16  SpO2 97%  Physical Exam    Nursing note and vitals reviewed. Constitutional: She appears well-developed and well-nourished. No distress.  HENT:  Head: Normocephalic and atraumatic.  Eyes: Conjunctivae are normal. Right eye exhibits no discharge. Left eye exhibits no discharge.  Neck: Neck supple.  Cardiovascular: Normal rate, regular rhythm and normal heart sounds.  Exam reveals no gallop and no friction rub.   No murmur heard. Pulmonary/Chest: Effort normal and breath sounds normal. No respiratory distress.  Abdominal: Soft. She exhibits no distension. There is no tenderness.  Musculoskeletal: Normal range of motion. She exhibits no edema and no tenderness.       LE grossly normal in appearance. Symmetric. No edema or concerning lesions. Could not palpate pedal pulses but extremities warm and has good cap refill. Strength good.  Neurological: She is alert.  Skin: Skin is warm and dry.    ED Course  Procedures (including critical care time)  Labs Reviewed - No data to display No results found.   1. Intermittent claudication   2. Leg pain       MDM  82yF with b/l  leg pain. Suspect claudication with apparent relationship to ambulating. Also consider trauma, DVT, embolism but clinically doubt. Discussed with husband at length and recommending fu with PCP.       Raeford Razor, MD 03/29/11 [phone]

## 2011-04-24 ENCOUNTER — Other Ambulatory Visit: Payer: Self-pay | Admitting: Internal Medicine

## 2011-04-24 DIAGNOSIS — Z780 Asymptomatic menopausal state: Secondary | ICD-10-CM

## 2011-04-30 ENCOUNTER — Other Ambulatory Visit: Payer: Medicare Other

## 2011-05-06 ENCOUNTER — Ambulatory Visit
Admission: RE | Admit: 2011-05-06 | Discharge: 2011-05-06 | Disposition: A | Discharge status: Home / Self Care | Payer: Medicare Other | Source: Ambulatory Visit | Attending: Internal Medicine | Admitting: Internal Medicine

## 2011-05-06 DIAGNOSIS — Z780 Asymptomatic menopausal state: Secondary | ICD-10-CM

## 2011-08-15 ENCOUNTER — Ambulatory Visit (HOSPITAL_COMMUNITY)
Admission: RE | Admit: 2011-08-15 | Discharge: 2011-08-15 | Disposition: A | Discharge status: Home / Self Care | Payer: Medicare Other | Source: Ambulatory Visit | Attending: *Deleted | Admitting: *Deleted

## 2011-08-15 ENCOUNTER — Other Ambulatory Visit (HOSPITAL_COMMUNITY): Payer: Self-pay | Admitting: *Deleted

## 2011-08-15 ENCOUNTER — Encounter (HOSPITAL_COMMUNITY): Payer: Self-pay

## 2011-08-15 DIAGNOSIS — I10 Essential (primary) hypertension: Secondary | ICD-10-CM | POA: Insufficient documentation

## 2011-08-15 DIAGNOSIS — R1011 Right upper quadrant pain: Secondary | ICD-10-CM | POA: Insufficient documentation

## 2011-08-15 DIAGNOSIS — E119 Type 2 diabetes mellitus without complications: Secondary | ICD-10-CM | POA: Insufficient documentation

## 2011-08-15 LAB — COMPREHENSIVE METABOLIC PANEL
AST: 19 U/L (ref 0–37)
CO2: 30 mEq/L (ref 19–32)
Calcium: 9.9 mg/dL (ref 8.4–10.5)
Creatinine, Ser: 0.84 mg/dL (ref 0.50–1.10)
GFR calc non Af Amer: 64 mL/min — ABNORMAL LOW (ref >90)

## 2011-08-15 MED ORDER — IOHEXOL 300 MG/ML  SOLN
80.0000 mL | Freq: Once | INTRAMUSCULAR | Status: AC | PRN
  Administered 2011-08-15: 80 mL via INTRAVENOUS

## 2012-03-23 ENCOUNTER — Ambulatory Visit: Payer: Medicare Other | Attending: Internal Medicine | Admitting: Physical Therapy

## 2012-03-23 DIAGNOSIS — R262 Difficulty in walking, not elsewhere classified: Secondary | ICD-10-CM | POA: Insufficient documentation

## 2012-03-23 DIAGNOSIS — IMO0001 Reserved for inherently not codable concepts without codable children: Secondary | ICD-10-CM | POA: Insufficient documentation

## 2012-04-20 ENCOUNTER — Ambulatory Visit: Payer: No Typology Code available for payment source | Admitting: Physical Therapy

## 2012-04-23 ENCOUNTER — Ambulatory Visit: Payer: Medicare Other | Attending: Internal Medicine | Admitting: Physical Therapy

## 2012-04-23 DIAGNOSIS — R262 Difficulty in walking, not elsewhere classified: Secondary | ICD-10-CM | POA: Insufficient documentation

## 2012-04-23 DIAGNOSIS — IMO0001 Reserved for inherently not codable concepts without codable children: Secondary | ICD-10-CM | POA: Insufficient documentation

## 2012-05-04 ENCOUNTER — Inpatient Hospital Stay (HOSPITAL_COMMUNITY)
Admission: EM | Admit: 2012-05-04 | Discharge: 2012-05-06 | DRG: 086 | Disposition: A | Discharge status: Home / Self Care | Payer: Medicare Other | Attending: Family Medicine | Admitting: Family Medicine

## 2012-05-04 ENCOUNTER — Emergency Department (HOSPITAL_COMMUNITY): Payer: Medicare Other

## 2012-05-04 ENCOUNTER — Encounter (HOSPITAL_COMMUNITY): Payer: Self-pay | Admitting: Emergency Medicine

## 2012-05-04 DIAGNOSIS — I10 Essential (primary) hypertension: Secondary | ICD-10-CM | POA: Diagnosis present

## 2012-05-04 DIAGNOSIS — K219 Gastro-esophageal reflux disease without esophagitis: Secondary | ICD-10-CM | POA: Diagnosis present

## 2012-05-04 DIAGNOSIS — S0180XA Unspecified open wound of other part of head, initial encounter: Secondary | ICD-10-CM | POA: Diagnosis present

## 2012-05-04 DIAGNOSIS — R5381 Other malaise: Secondary | ICD-10-CM | POA: Diagnosis present

## 2012-05-04 DIAGNOSIS — E785 Hyperlipidemia, unspecified: Secondary | ICD-10-CM | POA: Diagnosis present

## 2012-05-04 DIAGNOSIS — F0150 Vascular dementia without behavioral disturbance: Secondary | ICD-10-CM | POA: Diagnosis present

## 2012-05-04 DIAGNOSIS — S06339A Contusion and laceration of cerebrum, unspecified, with loss of consciousness of unspecified duration, initial encounter: Secondary | ICD-10-CM

## 2012-05-04 DIAGNOSIS — F0390 Unspecified dementia without behavioral disturbance: Secondary | ICD-10-CM | POA: Diagnosis present

## 2012-05-04 DIAGNOSIS — W010XXA Fall on same level from slipping, tripping and stumbling without subsequent striking against object, initial encounter: Secondary | ICD-10-CM | POA: Diagnosis present

## 2012-05-04 DIAGNOSIS — S0191XA Laceration without foreign body of unspecified part of head, initial encounter: Secondary | ICD-10-CM

## 2012-05-04 DIAGNOSIS — E870 Hyperosmolality and hypernatremia: Secondary | ICD-10-CM | POA: Diagnosis present

## 2012-05-04 DIAGNOSIS — S199XXA Unspecified injury of neck, initial encounter: Secondary | ICD-10-CM

## 2012-05-04 DIAGNOSIS — S06330A Contusion and laceration of cerebrum, unspecified, without loss of consciousness, initial encounter: Secondary | ICD-10-CM

## 2012-05-04 DIAGNOSIS — S0633AA Contusion and laceration of cerebrum, unspecified, with loss of consciousness status unknown, initial encounter: Secondary | ICD-10-CM

## 2012-05-04 DIAGNOSIS — S0190XA Unspecified open wound of unspecified part of head, initial encounter: Secondary | ICD-10-CM

## 2012-05-04 DIAGNOSIS — E119 Type 2 diabetes mellitus without complications: Secondary | ICD-10-CM | POA: Diagnosis present

## 2012-05-04 DIAGNOSIS — Z9181 History of falling: Secondary | ICD-10-CM

## 2012-05-04 DIAGNOSIS — Y92009 Unspecified place in unspecified non-institutional (private) residence as the place of occurrence of the external cause: Secondary | ICD-10-CM

## 2012-05-04 DIAGNOSIS — Z8673 Personal history of transient ischemic attack (TIA), and cerebral infarction without residual deficits: Secondary | ICD-10-CM

## 2012-05-04 DIAGNOSIS — Z993 Dependence on wheelchair: Secondary | ICD-10-CM

## 2012-05-04 HISTORY — DX: Contusion and laceration of cerebrum, unspecified, with loss of consciousness status unknown, initial encounter: S06.33AA

## 2012-05-04 HISTORY — DX: Contusion and laceration of cerebrum, unspecified, with loss of consciousness of unspecified duration, initial encounter: S06.339A

## 2012-05-04 LAB — CBC WITH DIFFERENTIAL/PLATELET
Basophils Relative: 0 % (ref 0–1)
Eosinophils Absolute: 0 10*3/uL (ref 0.0–0.7)
HCT: 39.7 % (ref 36.0–46.0)
Hemoglobin: 12.5 g/dL (ref 12.0–15.0)
Lymphs Abs: 1.5 10*3/uL (ref 0.7–4.0)
MCH: 27.7 pg (ref 26.0–34.0)
MCHC: 31.5 g/dL (ref 30.0–36.0)
Monocytes Absolute: 0.4 10*3/uL (ref 0.1–1.0)
Monocytes Relative: 5 % (ref 3–12)
RBC: 4.52 MIL/uL (ref 3.87–5.11)

## 2012-05-04 LAB — PROTIME-INR: INR: 1.07 (ref 0.00–1.49)

## 2012-05-04 LAB — BASIC METABOLIC PANEL
BUN: 13 mg/dL (ref 6–23)
CO2: 29 mEq/L (ref 19–32)
Chloride: 109 mEq/L (ref 96–112)
Glucose, Bld: 107 mg/dL — ABNORMAL HIGH (ref 70–99)
Potassium: 3.5 mEq/L (ref 3.5–5.1)
Sodium: 150 mEq/L — ABNORMAL HIGH (ref 135–145)

## 2012-05-04 MED ORDER — HALOPERIDOL LACTATE 5 MG/ML IJ SOLN
1.0000 mg | Freq: Four times a day (QID) | INTRAMUSCULAR | Status: DC | PRN
  Administered 2012-05-05: 1 mg via INTRAMUSCULAR
  Filled 2012-05-04: qty 1

## 2012-05-04 MED ORDER — HALOPERIDOL LACTATE 5 MG/ML IJ SOLN
1.0000 mg | Freq: Once | INTRAMUSCULAR | Status: AC
  Administered 2012-05-04: 1 mg via INTRAVENOUS
  Filled 2012-05-04: qty 1

## 2012-05-04 MED ORDER — RIVASTIGMINE 9.5 MG/24HR TD PT24
9.5000 mg | MEDICATED_PATCH | Freq: Every day | TRANSDERMAL | Status: DC
  Administered 2012-05-05 – 2012-05-06 (×2): 9.5 mg via TRANSDERMAL
  Filled 2012-05-04 (×2): qty 1

## 2012-05-04 MED ORDER — LORAZEPAM 1 MG PO TABS: 0.5000 mg | ORAL_TABLET | Freq: Once | ORAL | Status: DC

## 2012-05-04 MED ORDER — HALOPERIDOL LACTATE 5 MG/ML IJ SOLN
2.0000 mg | Freq: Once | INTRAMUSCULAR | Status: AC
  Administered 2012-05-04: 2 mg via INTRAVENOUS
  Filled 2012-05-04: qty 1

## 2012-05-04 MED ORDER — LORAZEPAM 2 MG/ML IJ SOLN
0.5000 mg | Freq: Once | INTRAMUSCULAR | Status: AC
  Administered 2012-05-04: 0.5 mg via INTRAVENOUS
  Filled 2012-05-04: qty 1

## 2012-05-04 MED ORDER — HYDRALAZINE HCL 20 MG/ML IJ SOLN
5.0000 mg | INTRAMUSCULAR | Status: DC | PRN
  Filled 2012-05-04: qty 0.25

## 2012-05-04 MED ORDER — SODIUM CHLORIDE 0.45 % IV SOLN
INTRAVENOUS | Status: DC
  Administered 2012-05-04 – 2012-05-05 (×2): via INTRAVENOUS

## 2012-05-04 MED ORDER — MORPHINE SULFATE 2 MG/ML IJ SOLN
1.0000 mg | INTRAMUSCULAR | Status: DC | PRN
  Administered 2012-05-05: 1 mg via INTRAVENOUS
  Filled 2012-05-04: qty 1

## 2012-05-04 NOTE — ED Notes (Addendum)
Dr. Jennette Kettle requesting pt be placed in different cervical spine immobilizing collar. RN and NT removed philadelphia collar and replaced with small adult Aspen collar while maintaining C-spine alignment.

## 2012-05-04 NOTE — ED Notes (Signed)
Pt returned from radiology.

## 2012-05-04 NOTE — ED Notes (Signed)
Pt unable to sit still for MRI, charge nurse on 4N floor notified patient will be transported to room.

## 2012-05-04 NOTE — ED Notes (Signed)
MRI came to transport patient. Pt not able to sit still for test, pt restless and agitated. Admitting physician notified.

## 2012-05-04 NOTE — H&P (Signed)
Family Medicine Teaching [facility] Admission History and Physical Service Pager: [phone]  Patient name: Tracie [name] Medical record number: [id number] Date of birth: 1928-12-10 Age: 79 y.o. Gender: female  Primary Care Provider: Default, Provider, MD  Chief Complaint: Head injury  Assessment and Plan: Tracie [name] is a 79 y.o. year old female with a history of dementia, HTN, and DM presenting with a head injury following a fall today.  1. Cerebral contusion/facet slip at C6/7 level: patient with these evidenced on CT scan following fall in which she hit her head without loss of consciousness. -will admit to telemetry under FPTS -discussed case with neurosurgery, Dr. Jordan Likes, states needs observation, repeat CT scan in the morning, and will formally c/s if there are changes on scan, additionally states no restriction on activity -neuro checks q2 hr -patient to be placed in c-collar - Head laceration has been repaired with simple sutures, will give low dose morphine PRN for pain.  -attempted to obtain flexion and extension, but patient uncooperative-will order MRI head and neck to evaluate for cervical ligamentous injury  2. Fall: patient with recent history of 2 falls over the past 3-4 months. Family states is wheel chair bound and has difficulty getting around on own. Falls likely related to deconditioning and cognitive impairment. Question of whether this could be related to infection though less likely given normal WBC. -PT/OT eval and treat -up with assistance -UA and UCx ordered to evaluate for infectious cause, no respiratory complaints or clinical findings.   3. Dementia: patient is on namenda and rivastigmine patch at home. Family states is currently at her baseline. Unable to answer questions, but able to follow simple commands. -SLP to do bedside swallow study, patient is currently sedated due to ativan in ER, will avoid this medication during hospitalization.  -will  continue rivastigmine patch -will restart namenda once passed bedside swallow  -haldol for any agitation -patient on depakote at home ?for seizures vs mood stabilizer-will hold until passes swallow study  4. Hypernatremia: likely related to dehydration given that patient depends on those around her for sustenance. Cr 0.73, so good renal function -will rehydrate with 1/2 NS @ 100 mL/hr, encourage PO intake when more alert and passes swallow study.  -will follow-up BMP in the am   5. GERD: on prilosec at home, will hold this until passes swallow study  6. HLD: per history is on lipitor, will hold this medication until passes swallow study  FEN/GI: NPO until passes swallow study, 1/2 NS @ 100 mL/hr Prophylaxis: SCDs due to falls risk Disposition: admit to telemetry, discharge pending stable scan and exam in the morning Code Status: full, will need to discuss with family members  History of Present Illness: Tracie [name] is a 79 y.o. year old female with a history of dementia, HTN, and DM presenting with a head injury following a fall today. History mostly obtained through ED records and from conversation with family as patient is non-conversant. Today the patient was at adult day-care when she was in the bathroom with the staff she pulled away and had a fall inwhcih she hit her head. She did not have LOC and was speaking per report following the incident. She was then brought to the ED.  In the ED she was noted to be at her baseline per her family members. She had an approximately 2 cm laceration above her right eyebrow. They obtained a head CT that revealed cerebral contusion and neck CT that showed anterior  slip of the C6 facet on the C7 facet. Attempted to get flexion and extension XR, though patient was not cooperative with exam. Notably patient is not on blood thinners. Patients family endorses 2 falls over the last 3-4 months and that the patient is wheel chair bound. Additionally is  sometimes able to feed herself.  Spoke with neurosurgeon who advised to admit the patient and observe for neurological changes. Also recommended repeat CT scan in the morning, if unchanged can d/c, if changed he will formally consult.  Patient Active Problem List  Diagnosis  . Cerebral contusion  . Laceration of head   Past Medical History: Past Medical History  Diagnosis Date  . Stroke   . Dementia   . Hypertension   . Diabetes mellitus    Past Surgical History: Past Surgical History  Procedure Laterality Date  . Joint replacement    . Abdominal hysterectomy     Social History: History  Substance Use Topics  . Smoking status: Never Smoker   . Smokeless tobacco: Never Used  . Alcohol Use: No   Lives with daughter and husband.  Attends adult daycare several times per week.   For any additional social history documentation, please refer to relevant sections of EMR.  Family History: History reviewed. No pertinent family history. Allergies: No Known Allergies No current facility-administered medications on file prior to encounter.   Current Outpatient Prescriptions on File Prior to Encounter  Medication Sig Dispense Refill  . atorvastatin (LIPITOR) 20 MG tablet Take 20 mg by mouth daily.        . divalproex (DEPAKOTE) 125 MG DR tablet Take 125 mg by mouth daily.        . memantine (NAMENDA) 10 MG tablet Take 10 mg by mouth daily.        Marland Kitchen omeprazole (PRILOSEC) 20 MG capsule Take 20 mg by mouth daily.        . rivastigmine (EXELON) 9.5 mg/24hr Place 1 patch onto the skin daily.         Review Of Systems: Per HPI with the following additions: none Otherwise 12 point review of systems was performed and was unremarkable.  Physical Exam: BP 138/111  Pulse 100  Temp(Src) 98 F (36.7 C) (Oral)  Resp 20  SpO2 92% Exam: General: no acute distress, appears drowsy, responds to commands, non-verbal HEENT: MMM, PERRL, poorly fit c-collar in place Cardiovascular: rrr, no  mrg Respiratory: CTAB, no wheezes or crackles Abdomen:  Soft, NT, ND Extremities: no edema Skin: 6 cm laceration above right eyelid  Neuro: non-verbal, responds to some simple commands like opening her mouth and eyes, moving all 4 extremities equally, no focal deficits  Labs and Imaging: CBC BMET   Recent Labs Lab 05/04/12 1621  WBC 7.7  HGB 12.5  HCT 39.7  PLT 137*    Recent Labs Lab 05/04/12 1621  NA 150*  K 3.5  CL 109  CO2 29  BUN 13  CREATININE 0.73  GLUCOSE 107*  CALCIUM 9.8     INR 1.07  Dg Hip Bilateral W/pelvis  05/04/2012   IMPRESSION: Osseous demineralization. Probably old fractures of the left superior and inferior pubic rami. No definite acute bony abnormalities. Significantly increased stool in rectum and sigmoid colon.   Original Report Authenticated By: Ulyses Southward, M.D.    Ct Head Wo Contrast  05/04/2012   IMPRESSION: Cervical kyphosis as noted above without evidence of cervical spine fracture.  Critical Value/emergent results were called by telephone at the time  of interpretation on 05/04/2012 at 12:19 p.m. to Arthor Captain physician's assistant, who verbally acknowledged these results.   Original Report Authenticated By: Lacy Duverney, M.D.    Ct Cervical Spine Wo Contrast  05/04/2012   IMPRESSION: Cervical kyphosis as noted above without evidence of cervical spine fracture.  Critical Value/emergent results were called by telephone at the time of interpretation on 05/04/2012 at 12:19 p.m. to Arthor Captain physician's assistant, who verbally acknowledged these results.   Original Report Authenticated By: Lacy Duverney, M.D.    Marikay Alar, MD 05/04/2012, 5:36 PM  PGY3 Addendum to PGY1 H & P:   I saw and examined this patient with Dr. Birdie Sons and participated in the formulation of this history and physical.  I agree with his findings, with above, highlighted additions.   Breken Nazari 05/04/2012 7:35 PM

## 2012-05-04 NOTE — ED Notes (Signed)
Pt with witnessed fall in bathroom while at adult daycare today. Neg LOC or blood thinners. Pt with alzheimers . Per EMS pt normally disoriented. Pt NAD awake, verbalizing, MAE, laceration noted to right eyebrow. Bleeding controlled.

## 2012-05-04 NOTE — ED Provider Notes (Signed)
79yo F, significant hx dementia, fell while at adult daycare today. Witnessed by staff.  No reported LOC.  Family states pt is acting per her baseline.  Awake, alert, easily agitated per her baseline, VSS, resps easy, CTA, abd soft/NT, moves all ext spontaneously on stretcher, +right lateral eyebrow/forehead irregular lac, hemostatic.  CT-H with small hemorrhagic contusions, possible subarachnoid blood.  Neurosurgery requests to admit to medicine service.  Will check labs, suture lac, admit to medicine service.       Laray Anger, DO 05/04/12 [phone]

## 2012-05-04 NOTE — ED Provider Notes (Signed)
History     CSN: [id number]  Arrival date & time 05/04/12  1035   First MD Initiated Contact with Patient 05/04/12 1100      Chief Complaint  Patient presents with  . Fall    (Consider location/radiation/quality/duration/timing/severity/associated sxs/prior treatment) The history is limited by the condition of the patient.   79 year old female presents to the emergency room via EMS for fall and laceration to the right eyebrow.  Level V caveat due to dementia. Patient had witnessed fall in bathroom no loss of consciousness.  She is not on blood thinners.  Patient appears to be at baseline with her disorientation.   Past Medical History  Diagnosis Date  . Stroke   . Dementia   . Hypertension   . Diabetes mellitus     Past Surgical History  Procedure Laterality Date  . Joint replacement    . Abdominal hysterectomy      History reviewed. No pertinent family history.  History  Substance Use Topics  . Smoking status: Never Smoker   . Smokeless tobacco: Never Used  . Alcohol Use: No    OB History   Grav Para Term Preterm Abortions TAB SAB Ect Mult Living                  Review of Systems  Unable to perform ROS: Dementia    Allergies  Review of patient's allergies indicates no known allergies.  Home Medications   Current Outpatient Rx  Name  Route  Sig  Dispense  Refill  . atorvastatin (LIPITOR) 20 MG tablet   Oral   Take 20 mg by mouth daily.           . divalproex (DEPAKOTE) 125 MG DR tablet   Oral   Take 125 mg by mouth daily.           Marland Kitchen linagliptin (TRADJENTA) 5 MG TABS tablet   Oral   Take 5 mg by mouth daily.           . memantine (NAMENDA) 10 MG tablet   Oral   Take 10 mg by mouth daily.           Marland Kitchen omeprazole (PRILOSEC) 20 MG capsule   Oral   Take 20 mg by mouth daily.           . rivastigmine (EXELON) 9.5 mg/24hr   Transdermal   Place 1 patch onto the skin daily.           Marland Kitchen triamterene-hydrochlorothiazide (MAXZIDE-25)  37.5-25 MG per tablet   Oral   Take 0.5 tablets by mouth daily.             BP 130/99  Pulse 54  Temp(Src) 98 F (36.7 C) (Oral)  SpO2 100%  Physical Exam  Nursing note and vitals reviewed. Constitutional: No distress.  Elderly, cachectic  HENT:  Nose: Nose normal.  Laceration to the right brow  Eyes: Pupils are equal, round, and reactive to light.  Neck:  c-collar in place.    Abdominal: Soft. She exhibits no distension. There is no tenderness.  Musculoskeletal:  TTP pelvis, his. Sacrum  Neurological: She is alert.  Alert GCS 14  Skin: Skin is warm.    ED Course  Procedures (including critical care time) LACERATION REPAIR Performed by: Arthor Captain Authorized by: Arthor Captain Consent: Verbal consent obtained. Risks and benefits: risks, benefits and alternatives were discussed Consent given by: patient Patient identity confirmed: provided demographic data Prepped and Draped in normal  sterile fashion Wound explored  Laceration Location: forew/head  Laceration Length: 3cm  No Foreign Bodies seen or palpated  Anesthesia: local infiltration  Local anesthetic: lidocaine 2%  epinephrine  Anesthetic total: 5 ml  Irrigation method: syringe Amount of cleaning: standard  Skin closure: 6.0 prolene  Number of sutures: 13  Technique: RI, SI  Patient tolerance: Patient tolerated the procedure well with no immediate complications.  Labs Reviewed - No data to display Dg Hip Bilateral W/pelvis  05/04/2012  *RADIOLOGY REPORT*  Clinical Data: Fall, confusion, he is grabbing left hip  BILATERAL HIP WITH PELVIS - 4+ VIEW  Comparison: Abdominal radiograph 10/05/2007  Findings: Marked osseous demineralization. Probably old post-traumatic deformities of the left superior and inferior pubic rami. Appearance of the superior left pubic ramus is unchanged since the prior abdominal radiograph, which does not include the inferior pubic ramus. Symmetric hip and SI joints.  No definite acute fracture, dislocation, or bone destruction. Few scattered phleboliths. Significantly increased stool in rectum and sigmoid colon.  IMPRESSION: Osseous demineralization. Probably old fractures of the left superior and inferior pubic rami. No definite acute bony abnormalities. Significantly increased stool in rectum and sigmoid colon.   Original Report Authenticated By: Ulyses Southward, M.D.    Ct Head Wo Contrast  05/04/2012  *RADIOLOGY REPORT*  Clinical Data:  Fall. Dementia.  CT HEAD WITHOUT CONTRAST CT CERVICAL SPINE WITHOUT CONTRAST  Technique:  Multidetector CT imaging of the head and cervical spine was performed following the standard protocol without intravenous contrast.  Multiplanar CT image reconstructions of the cervical spine were also generated.  Comparison:  10/10/2010 head CT.  01/27/2004 head CT and cervical spine CT.  CT HEAD  Findings: Motion degraded exam.  Soft tissue injury right supraorbital region.  No obvious underlying fracture although evaluation limited by motion.  Opacification sphenoid sinus air cells with central hyperdense material probably representing inspissated proteinaceous material consistent with chronic sinusitis.  No obvious skull fracture.  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  In addition to the hypodensity in a gyriform distribution and felt to be related to laminar necrosis, there are hemorrhagic contusions surrounding the infarcts and possibly small amount of subarachnoid blood.  1 cm focal hematoma right periopercular region (series 2 image 38).  Global atrophy.  Ventricular prominence.  Question mild hydrocephalus superimposed upon the central atrophy.  Prominent small vessel disease type changes without CT evidence of large acute thrombotic infarct.  No intracranial mass lesion detected on this unenhanced exam.  IMPRESSION: Motion degraded exam.  Remote left posterior frontal - parietal lobe infarct with laminar  necrosis.  In addition, there are hemorrhagic contusions surrounding the infarct and possibly small amount of subarachnoid blood.  1 cm focal hematoma right periopercular region (series 2 image 38) adjacent to the pterion.  Soft tissue injury right supraorbital region.  No obvious underlying fracture although evaluation limited by motion.  Opacification sphenoid sinus air cells with central hyperdense material probably representing inspissated proteinaceous material consistent with chronic sinusitis.  No obvious skull fracture.  Global atrophy.  Ventricular prominence.  Question mild hydrocephalus superimposed upon the central atrophy.  CT CERVICAL SPINE  Findings: Apical pleural thickening.  Additionally, small density in both lung apices measuring up to the 6 mm on the right.  Given risk factors for bronchogenic carcinoma, follow-up chest CT at 6 - 12 months is recommended.  This recommendation follows the consensus statement: Guidelines for Management of Small Pulmonary Nodules Detected on CT Scans: A Statement  from the Fleischner Society as published in Radiology 2005; 237:395-400.  Carotid bifurcation calcifications.  Cervical kyphosis centered at the C6 level.  There is mild anterior slip of the C6 facet on the C7 facet.  This may be related to patient's head positioning.  If ligamentous injury is of high clinical concern, flexion/extension views can be obtained for further delineation or MR performed if the patient is not stable.  No cervical spine fracture.  Cervical spondylotic changes.  IMPRESSION: Cervical kyphosis as noted above without evidence of cervical spine fracture.  Critical Value/emergent results were called by telephone at the time of interpretation on 05/04/2012 at 12:19 p.m. to Arthor Captain physician's assistant, who verbally acknowledged these results.   Original Report Authenticated By: Lacy Duverney, M.D.    Ct Cervical Spine Wo Contrast  05/04/2012  *RADIOLOGY REPORT*  Clinical Data:   Fall. Dementia.  CT HEAD WITHOUT CONTRAST CT CERVICAL SPINE WITHOUT CONTRAST  Technique:  Multidetector CT imaging of the head and cervical spine was performed following the standard protocol without intravenous contrast.  Multiplanar CT image reconstructions of the cervical spine were also generated.  Comparison:  10/10/2010 head CT.  01/27/2004 head CT and cervical spine CT.  CT HEAD  Findings: Motion degraded exam.  Soft tissue injury right supraorbital region.  No obvious underlying fracture although evaluation limited by motion.  Opacification sphenoid sinus air cells with central hyperdense material probably representing inspissated proteinaceous material consistent with chronic sinusitis.  No obvious skull fracture.  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  In addition to the hypodensity in a gyriform distribution and felt to be related to laminar necrosis, there are hemorrhagic contusions surrounding the infarcts and possibly small amount of subarachnoid blood.  1 cm focal hematoma right periopercular region (series 2 image 38).  Global atrophy.  Ventricular prominence.  Question mild hydrocephalus superimposed upon the central atrophy.  Prominent small vessel disease type changes without CT evidence of large acute thrombotic infarct.  No intracranial mass lesion detected on this unenhanced exam.  IMPRESSION: Motion degraded exam.  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  In addition, there are hemorrhagic contusions surrounding the infarct and possibly small amount of subarachnoid blood.  1 cm focal hematoma right periopercular region (series 2 image 38) adjacent to the pterion.  Soft tissue injury right supraorbital region.  No obvious underlying fracture although evaluation limited by motion.  Opacification sphenoid sinus air cells with central hyperdense material probably representing inspissated proteinaceous material consistent  with chronic sinusitis.  No obvious skull fracture.  Global atrophy.  Ventricular prominence.  Question mild hydrocephalus superimposed upon the central atrophy.  CT CERVICAL SPINE  Findings: Apical pleural thickening.  Additionally, small density in both lung apices measuring up to the 6 mm on the right.  Given risk factors for bronchogenic carcinoma, follow-up chest CT at 6 - 12 months is recommended.  This recommendation follows the consensus statement: Guidelines for Management of Small Pulmonary Nodules Detected on CT Scans: A Statement from the Fleischner Society as published in Radiology 2005; 237:395-400.  Carotid bifurcation calcifications.  Cervical kyphosis centered at the C6 level.  There is mild anterior slip of the C6 facet on the C7 facet.  This may be related to patient's head positioning.  If ligamentous injury is of high clinical concern, flexion/extension views can be obtained for further delineation or MR performed if the patient is not stable.  No cervical spine fracture.  Cervical  spondylotic changes.  IMPRESSION: Cervical kyphosis as noted above without evidence of cervical spine fracture.  Critical Value/emergent results were called by telephone at the time of interpretation on 05/04/2012 at 12:19 p.m. to Arthor Captain physician's assistant, who verbally acknowledged these results.   Original Report Authenticated By: Lacy Duverney, M.D.      1. Cerebral contusion   2. Dementia   3. Laceration of head       MDM  1:04 PM Filed Vitals:   05/04/12 1029 05/04/12 1159 05/04/12 1230  BP: 165/93 170/99 130/99  Pulse: 96 54   Temp: 98 F (36.7 C)    TempSrc: Oral    SpO2: 96% 100%     Patient with bleeding intracranial contusions and Cspine abnormality.  I am callling for neurosurgery consult.  Patient will need admission.   1:28 PM Filed Vitals:   05/04/12 1029 05/04/12 1159 05/04/12 1230 05/04/12 1325  BP: 165/93 170/99 130/99 186/100  Pulse: 96 54  107  Temp: 98 F  (36.7 C)     TempSrc: Oral     Resp:    16  SpO2: 96% 100%  100%      1:45 PM BP 186/100  Pulse 107  Temp(Src) 98 F (36.7 C) (Oral)  Resp 16  SpO2 100% Spoke with Dr. Jordan Likes who suspects cspine abnormality is chronic.  Patient was sent for flexion extension xrays , but unable to cooperate.  Will clear cspine withj MRI.  Family medicine will admit.   Arthor Captain, PA-C 05/05/12 1041

## 2012-05-04 NOTE — ED Notes (Signed)
Antecubital IV infiltrated upon administration of IV haldol 2mg . New IV placed in left forearm.

## 2012-05-04 NOTE — ED Notes (Signed)
Pt log rolled from backboard with 3 staff members. C-collar remains in place. Pt with tenderness when palpated along lumbar and sacral region.

## 2012-05-04 NOTE — ED Notes (Signed)
EDP at bedside  

## 2012-05-04 NOTE — H&P (Signed)
FMTS Attending Admission Note: Tracie Levy MD [phone] pager office [phone] I  have seen and examined this patient, reviewed their chart. I have discussed this patient with the resident. I agree with the resident's findings, assessment and care plan. CT spine revealed apical thickening in lungs as well as concern for possible ligamentous injury; report:CT CERVICAL SPINE  Findings: Apical pleural thickening. Additionally, small density  in both lung apices measuring up to the 6 mm on the right.  Given risk factors for bronchogenic carcinoma, follow-up chest CT  at 6 - 12 months is recommended. This recommendation follows the  consensus statement: Guidelines for Management of Small Pulmonary  Nodules Detected on CT Scans: A Statement from the Fleischner  Society as published in Radiology 2005; 237:395-400.  Carotid bifurcation calcifications.  Cervical kyphosis centered at the C6 level. There is mild anterior  slip of the C6 facet on the C7 facet. This may be related to  patient's head positioning. If ligamentous injury is of high  clinical concern, flexion/extension views can be obtained for  further delineation or MR performed if the patient is not stable.  No cervical spine fracture.  Cervical spondylotic changes  Patient is in C collar in ED.Discussed issues with family. Laceration has been repaired. Patient was intermittently agitated and per daughter and husband *at bedside0 she was at ger vbaseline and she can be intermittently agitated.

## 2012-05-04 NOTE — H&P (Signed)
Family Medicine Teaching [facility] Admission History and Physical Service Pager: [phone]  Patient name: Tracie [name] Medical record number: [id number] Date of birth: 1928/04/11 Age: 79 y.o. Gender: female  Primary Care Provider: Default, Provider, MD  Chief Complaint: Head injury  Assessment and Plan: Tracie [name] is a 79 y.o. year old female with a history of dementia, HTN, and DM presenting with a head injury following a fall today.  1. Cerebral contusion/facet slip at C6/7 level: patient with these evidenced on CT scan following fall in which she hit her head without loss of consciousness. -will admit to telemetry under FPTS -discussed case with neurosurgery, Dr. Jordan Likes, states needs observation, repeat CT scan in the morning, and will formally c/s if there are changes on scan, additionally states no restriction on activity -neuro checks q2 hr -patient to be placed in c-collar -attempted to obtain flexion and extension, but patient uncooperative-will order MRI head and neck  2. Fall: patient with recent history of 2 falls over the past 3-4 months. Family states is wheel chair bound and has difficulty getting around on own. Falls likely related to deconditioning and cognitive impairment. Question of whether this could be related to infection though less likely given normal WBC. -PT/OT eval and treat -up with assistance -UA and UCx ordered to evaluate for infectious cause  3. Dementia: patient is on namenda and rivastigmine patch at home. Family states is currently at her baseline. Unable to answer questions, but able to follow simple commands. -SLP to do bedside swallow study -will continue rivastigmine patch -will restart namenda once passed bedside swallow  -haldol for any agitation -patient on depakote at home ?for seizures vs mood stabilizer-will hold until passes swallow study  4. Hypernatremia: likely related to dehydration given that patient depends on those around  her for sustenance. Cr 0.73, so good renal function -will rehydrate with 1/2 NS @ 100 mL/hr -will follow-up BMP in the am   5. GERD: on prilosec at home, will hold this until passes swallow study  6. HLD: per history is on lipitor, will hold this medication until passes swallow study  FEN/GI: NPO until passes swallow study, 1/2 NS @ 100 mL/hr Prophylaxis: SCDs due to falls risk Disposition: admit to telemetry, discharge pending stable scan and exam in the morning Code Status: full, will need to discuss with family members  History of Present Illness: Tracie [name] is a 79 y.o. year old female with a history of dementia, HTN, and DM presenting with a head injury following a fall today. History mostly obtained through ED records and from conversation with family as patient is non-conversant. Today the patient was at adult day-care when she was in the bathroom with the staff she pulled away and had a fall inwhcih she hit her head. She did not have LOC and was speaking per report following the incident. She was then brought to the ED.  In the ED she was noted to be at her baseline per her family members. She had an approximately 2 cm laceration above her right eyebrow. They obtained a head CT that revealed cerebral contusion and neck CT that showed anterior slip of the C6 facet on the C7 facet. Attempted to get flexion and extension XR, though patient was not cooperative with exam. Notably patient is not on blood thinners. Patients family endorses 2 falls over the last 3-4 months and that the patient is wheel chair bound. Additionally is sometimes able to feed herself.  Spoke with  neurosurgeon who advised to admit the patient and observe for neurological changes. Also recommended repeat CT scan in the morning, if unchanged can d/c, if changed he will formally consult.  Patient Active Problem List  Diagnosis  . Cerebral contusion  . Laceration of head   Past Medical History: Past Medical History   Diagnosis Date  . Stroke   . Dementia   . Hypertension   . Diabetes mellitus    Past Surgical History: Past Surgical History  Procedure Laterality Date  . Joint replacement    . Abdominal hysterectomy     Social History: History  Substance Use Topics  . Smoking status: Never Smoker   . Smokeless tobacco: Never Used  . Alcohol Use: No   Lives with daughter and husband.  For any additional social history documentation, please refer to relevant sections of EMR.  Family History: History reviewed. No pertinent family history. Allergies: No Known Allergies No current facility-administered medications on file prior to encounter.   Current Outpatient Prescriptions on File Prior to Encounter  Medication Sig Dispense Refill  . atorvastatin (LIPITOR) 20 MG tablet Take 20 mg by mouth daily.        . divalproex (DEPAKOTE) 125 MG DR tablet Take 125 mg by mouth daily.        . memantine (NAMENDA) 10 MG tablet Take 10 mg by mouth daily.        Marland Kitchen omeprazole (PRILOSEC) 20 MG capsule Take 20 mg by mouth daily.        . rivastigmine (EXELON) 9.5 mg/24hr Place 1 patch onto the skin daily.         Review Of Systems: Per HPI with the following additions: none Otherwise 12 point review of systems was performed and was unremarkable.  Physical Exam: BP 138/111  Pulse 100  Temp(Src) 98 F (36.7 C) (Oral)  Resp 20  SpO2 92% Exam: General: no acute distress, appears drowsy, responds to commands, non-verbal HEENT: MMM, PERRL, poorly fit c-collar in place Cardiovascular: rrr, no mrg Respiratory: CTAB, no wheezes or crackles Abdomen:  Soft, NT, ND Extremities: no edema Skin: 2 cm laceration above right eyelid  Neuro: non-verbal, responds to some simple commands like opening her mouth and eyes, moving all 4 extremities equally  Labs and Imaging: CBC BMET   Recent Labs Lab 05/04/12 1621  WBC 7.7  HGB 12.5  HCT 39.7  PLT 137*    Recent Labs Lab 05/04/12 1621  NA 150*  K 3.5   CL 109  CO2 29  BUN 13  CREATININE 0.73  GLUCOSE 107*  CALCIUM 9.8     INR 1.07  Dg Hip Bilateral W/pelvis  05/04/2012   IMPRESSION: Osseous demineralization. Probably old fractures of the left superior and inferior pubic rami. No definite acute bony abnormalities. Significantly increased stool in rectum and sigmoid colon.   Original Report Authenticated By: Ulyses Southward, M.D.    Ct Head Wo Contrast  05/04/2012   IMPRESSION: Cervical kyphosis as noted above without evidence of cervical spine fracture.  Critical Value/emergent results were called by telephone at the time of interpretation on 05/04/2012 at 12:19 p.m. to Arthor Captain physician's assistant, who verbally acknowledged these results.   Original Report Authenticated By: Lacy Duverney, M.D.    Ct Cervical Spine Wo Contrast  05/04/2012   IMPRESSION: Cervical kyphosis as noted above without evidence of cervical spine fracture.  Critical Value/emergent results were called by telephone at the time of interpretation on 05/04/2012 at 12:19 p.m. to [facility]  [facility] physician's assistant, who verbally acknowledged these results.   Original Report Authenticated By: Lacy Duverney, M.D.    Marikay Alar, MD 05/04/2012, 5:36 PM

## 2012-05-04 NOTE — ED Notes (Signed)
PA at bedside for suturing 

## 2012-05-04 NOTE — H&P (Signed)
FMTS Attending Admission Note: Tracie Sant MD [phone] pager office [phone] I  have seen and examined this patient, reviewed their chart. I have discussed this patient with the resident. I agree with the resident's findings, assessment and care plan. 

## 2012-05-04 NOTE — ED Notes (Addendum)
Pt very restless, pulling at telemetry monitor leads, scratching at stitches and pulling off all covers. Admitting physician notified.

## 2012-05-05 ENCOUNTER — Inpatient Hospital Stay (HOSPITAL_COMMUNITY): Payer: Medicare Other

## 2012-05-05 DIAGNOSIS — S0993XA Unspecified injury of face, initial encounter: Secondary | ICD-10-CM

## 2012-05-05 DIAGNOSIS — S199XXA Unspecified injury of neck, initial encounter: Secondary | ICD-10-CM

## 2012-05-05 LAB — CBC
Hemoglobin: 12 g/dL (ref 12.0–15.0)
MCH: 28.4 pg (ref 26.0–34.0)
Platelets: 136 10*3/uL — ABNORMAL LOW (ref 150–400)
RBC: 4.22 MIL/uL (ref 3.87–5.11)
WBC: 6.5 10*3/uL (ref 4.0–10.5)

## 2012-05-05 LAB — URINALYSIS, ROUTINE W REFLEX MICROSCOPIC
Glucose, UA: NEGATIVE mg/dL
Ketones, ur: NEGATIVE mg/dL
Leukocytes, UA: NEGATIVE
Nitrite: NEGATIVE
Specific Gravity, Urine: 1.02 (ref 1.005–1.030)
pH: 7 (ref 5.0–8.0)

## 2012-05-05 LAB — BASIC METABOLIC PANEL
Calcium: 8.9 mg/dL (ref 8.4–10.5)
GFR calc Af Amer: >90 mL/min (ref >90)
GFR calc non Af Amer: 78 mL/min — ABNORMAL LOW (ref >90)
Glucose, Bld: 123 mg/dL — ABNORMAL HIGH (ref 70–99)
Potassium: 3.6 mEq/L (ref 3.5–5.1)
Sodium: 145 mEq/L (ref 135–145)

## 2012-05-05 MED ORDER — INSULIN ASPART 100 UNIT/ML ~~LOC~~ SOLN
0.0000 [IU] | Freq: Three times a day (TID) | SUBCUTANEOUS | Status: DC
  Administered 2012-05-06: 3 [IU] via SUBCUTANEOUS

## 2012-05-05 MED ORDER — DIVALPROEX SODIUM 125 MG PO DR TAB
125.0000 mg | DELAYED_RELEASE_TABLET | Freq: Every day | ORAL | Status: DC
  Administered 2012-05-06: 125 mg via ORAL
  Filled 2012-05-05 (×2): qty 1

## 2012-05-05 MED ORDER — ATORVASTATIN CALCIUM 20 MG PO TABS
20.0000 mg | ORAL_TABLET | Freq: Every day | ORAL | Status: DC
  Administered 2012-05-06: 20 mg via ORAL
  Filled 2012-05-05 (×2): qty 1

## 2012-05-05 MED ORDER — MEMANTINE HCL 10 MG PO TABS
10.0000 mg | ORAL_TABLET | Freq: Every day | ORAL | Status: DC
  Administered 2012-05-06: 10 mg via ORAL
  Filled 2012-05-05 (×2): qty 1

## 2012-05-05 MED ORDER — MORPHINE SULFATE 2 MG/ML IJ SOLN: 1.0000 mg | INTRAMUSCULAR | Status: DC | PRN

## 2012-05-05 NOTE — Progress Notes (Addendum)
Speech Language Pathology Dysphagia Treatment Patient Details Name: Tracie [name] MRN: [id number] DOB: 09/11/1928 Today's Date: 05/05/2012 Time: 1330-1400 SLP Time Calculation (min): 30 min  Assessment / Plan / Recommendation Clinical Impression  Focus of diagnostic treatment to reassess swallow for PO readiness following limited BSE completed this am.  Patient easily arousable and readily accepted trials of thin water by cup/straw and puree consistency.  Patient pushed out cracker prior to mastication repeatedly.  No outward s/s of aspiration noted with puree and thin liquid trials.  Recommend to proceed wth dysphagia 1 diet consistency with thin liquids with full supervision and assist each meal due to AMS.  ST to follow in acute care setting for diet tolerance and possible advancement to ensure safety.     Diet Recommendation  Initiate / Change Diet: Dysphagia 1 (puree);Thin liquid    SLP Plan Goals updated      Swallowing Goals  SLP Swallowing Goals Patient will consume recommended diet without observed clinical signs of aspiration with: Moderate assistance Patient will utilize recommended strategies during swallow to increase swallowing safety with: Moderate assistance Swallow Study Goal #3 - Progress: Met  General Temperature Spikes Noted: Yes Respiratory Status: Room air Behavior/Cognition: Confused;Pleasant mood;Cooperative Oral Cavity - Dentition: Adequate natural dentition Patient Positioning: Upright in bed  Oral Cavity - Oral Hygiene Does patient have any of the following "at risk" factors?: Nutritional status - inadequate Patient is HIGH RISK - Oral Care Protocol followed (see row info): Yes Patient is AT RISK - Oral Care Protocol followed (see row info): Yes   Dysphagia Treatment Treatment focused on: Facilitation of pharyngeal phase;Patient/family/caregiver education;Facilitation of oral preparatory phase;Facilitation of oral phase Treatment Methods/Modalities:  Skilled observation Patient observed directly with PO's: Yes Type of PO's observed: Dysphagia 1 (puree);Thin liquids Feeding: Total assist Liquids provided via: Cup;Straw Oral Phase Signs & Symptoms: Prolonged oral phase Type of cueing: Verbal Amount of cueing: Minimal   GO    Moreen Fowler MS, CCC-SLP [phone] [facility] 05/05/2012, 3:21 PM

## 2012-05-05 NOTE — Progress Notes (Signed)
INITIAL NUTRITION ASSESSMENT  DOCUMENTATION CODES Per approved criteria  -Not Applicable   INTERVENTION: 1. Glucerna Shake BID after diet is advanced. This will provide 440 kcal and 20 grams of protein  NUTRITION DIAGNOSIS: Inadequate oral intake related to inability to eat as evidenced by NPO status.   Goal: Meet >/=90% estimated nutrition needs  Monitor:  Diet advancement, weight trends, labs  Reason for Assessment: Low Braden  79 y.o. female  Admitting Dx: Cerebral contusion  ASSESSMENT: Pt experienced a fall while at adult daycare and was admitted with cerebral contusion and noted laceration to the right eyebrow with bleeding that has been controlled; possible subarachnoid bleed. Pt with hx of dementia/Alzheimer's. Pt is sometimes able to feed herself.  Pt is in a c-collar neck brace at this time. Per SLP note, pt to remain NPO due to limited ability to conduct PO trials due to LOA.  Spoke with family at bedside. Pt's appetite was at baseline PTA and she has been NPO since admission. Although Pt does not meet criteria for malnutrition at this time, she is at risk for malnutrition based on on BMI of 18.9 and ongoing NPO status.  Height: Ht Readings from Last 1 Encounters:  No data found for Ht  ~ 5\' 7"  (1.70 m), per son's report  Weight: Wt Readings from Last 1 Encounters:  05/05/12 120 lb 12.8 oz (54.795 kg)    Ideal Body Weight: ~135 lbs  % Ideal Body Weight: ~89%  Wt Readings from Last 10 Encounters:  05/05/12 120 lb 12.8 oz (54.795 kg)    Usual Body Weight: unknown, per son's report   BMI:  18.9, Normal (calculated using height reported by son)  Estimated Nutritional Needs: Kcal: 1400-1600 Protein: 65-75 grams Fluid: 1.4-1.6 L/day  Skin: Left eyebrow laceration with controlled bleeding  Diet Order: NPO  EDUCATION NEEDS: -No education needs identified at this time   Intake/Output Summary (Last 24 hours) at 05/05/12 1028 Last data filed at  05/05/12 1006  Gross per 24 hour  Intake      0 ml  Output      3 ml  Net     -3 ml    Last BM: 2/11   Labs:   Recent Labs Lab 05/04/12 1621 05/05/12 0730  NA 150* 145  K 3.5 3.6  CL 109 107  CO2 29 27  BUN 13 12  CREATININE 0.73 0.74  CALCIUM 9.8 8.9  GLUCOSE 107* 123*    CBG (last 3)  No results found for this basename: GLUCAP,  in the last 72 hours  Scheduled Meds: . rivastigmine  9.5 mg Transdermal Daily    Continuous Infusions: . sodium chloride 75 mL/hr at 05/04/12 2115    Past Medical History  Diagnosis Date  . Stroke   . Dementia   . Hypertension   . Diabetes mellitus     Past Surgical History  Procedure Laterality Date  . Joint replacement    . Abdominal hysterectomy      Trenton Gammon Dietetic Intern # [id number]  Student note/chart reviewed. Kendell Bane RD, LDN, CNSC [phone] Pager [phone] After Hours Pager

## 2012-05-05 NOTE — Evaluation (Signed)
Clinical/Bedside Swallow Evaluation Patient Details  Name: MARGUERETTE SHELLER MRN: [id number] Date of Birth: 06-05-1928  Today's Date: 05/05/2012 Time: 1000-1030 SLP Time Calculation (min): 30 min  Past Medical History:  Past Medical History  Diagnosis Date  . Stroke   . Dementia   . Hypertension   . Diabetes mellitus    Past Surgical History:  Past Surgical History  Procedure Laterality Date  . Joint replacement    . Abdominal hysterectomy     HPI:  Tracie [name] is a 79 y.o. year old female with a history of dementia, HTN, and DM presenting with a head injury following a fall today.  BSE indicated to assess risk for aspiration and recommend safest, possible PO diet.     Assessment / Plan / Recommendation Clinical Impression  Evaluation limited due to  intermittent LOA.  Patient arousable for brief moments and able to answer yes/no questions with clear vocal quality.  PO trial limited to ice chips with functional mastication and initiation of swallow.  Aware of patient administered pain medication and had ADL completed prior to SLP entering room which may have contributed to lethargy.  Cannot determine presence of aspiration due to limited PO trials .  RN to page SLP if patient's LOA  improves.  ST to reattempt at later time this date.      Aspiration Risk  Moderate    Diet Recommendation NPO   Medication Administration: Via alternative means    Other  Recommendations     Follow Up Recommendations  Other (comment) (TBD)    Frequency and Duration min 2x/week  2 weeks       SLP Swallow Goals Goal #3: Patient will maintain LOA and sustained attention to consume diagnostic PO trials of various consistencies to determine PO readiness.     Swallow Study Prior Functional Status   Lived at home with family     General Date of Onset: 05/04/12 HPI: Tracie [name] is a 79 y.o. year old female with a history of dementia, HTN, and DM presenting with a head injury following a fall  today. Type of Study: Bedside swallow evaluation Diet Prior to this Study: NPO Respiratory Status: Room air History of Recent Intubation: No Behavior/Cognition: Pleasant mood;Cooperative;Lethargic Oral Cavity - Dentition: Adequate natural dentition Self-Feeding Abilities: Total assist Patient Positioning: Upright in bed Baseline Vocal Quality: Clear Volitional Cough: Cognitively unable to elicit Volitional Swallow: Unable to elicit    Oral/Motor/Sensory Function Overall Oral Motor/Sensory Function: Appears within functional limits for tasks assessed   Ice Chips Ice chips: Within functional limits   Thin Liquid Thin Liquid: Not tested    Nectar Thick Nectar Thick Liquid: Not tested   Honey Thick Honey Thick Liquid: Not tested   Puree Puree: Not tested   Solid   GO    Solid: Not tested      Moreen Fowler MS, CCC-SLP [phone] [facility] 05/05/2012,11:05 AM

## 2012-05-05 NOTE — Progress Notes (Signed)
Family Medicine Teaching Service Daily Progress Note Service Page: [phone]  Subjective: doing well this morning. Family states slept all night. Still appears to not like neck brace, though this is properly fitting at this time.  Objective: Temp:  [97.3 F (36.3 C)-98.3 F (36.8 C)] 97.3 F (36.3 C) (02/11 0707) Pulse Rate:  [54-126] 103 (02/11 0707) Resp:  [16-20] 18 (02/11 0707) BP: (124-190)/(80-111) 165/91 mmHg (02/11 0707) SpO2:  [91 %-100 %] 100 % (02/11 0707) Exam: General: no acute distress, responsive to voice  HEENT: C-collar in place  Cardiovascular: rrr, no mrg  Respiratory: CTAB, no wheezes or crackles  Abdomen: Soft, NT, ND  Extremities: no edema  Skin: 6 cm laceration above right eyelid  Neuro: non-verbal, responds to voice, but refusing to follow commands, moving all 4 extremities equally, no focal deficits  CBC BMET   Recent Labs Lab 05/04/12 1621 05/05/12 0730  WBC 7.7 6.5  HGB 12.5 12.0  HCT 39.7 36.5  PLT 137* 136*    Recent Labs Lab 05/04/12 1621 05/05/12 0730  NA 150* 145  K 3.5 3.6  CL 109 107  CO2 29 27  BUN 13 12  CREATININE 0.73 0.74  GLUCOSE 107* 123*  CALCIUM 9.8 8.9     Imaging/Diagnostic Tests: Dg Hip Bilateral W/pelvis  05/04/2012  *RADIOLOGY REPORT*  Clinical Data: Fall, confusion, he is grabbing left hip  BILATERAL HIP WITH PELVIS - 4+ VIEW  Comparison: Abdominal radiograph 10/05/2007  Findings: Marked osseous demineralization. Probably old post-traumatic deformities of the left superior and inferior pubic rami. Appearance of the superior left pubic ramus is unchanged since the prior abdominal radiograph, which does not include the inferior pubic ramus. Symmetric hip and SI joints. No definite acute fracture, dislocation, or bone destruction. Few scattered phleboliths. Significantly increased stool in rectum and sigmoid colon.  IMPRESSION: Osseous demineralization. Probably old fractures of the left superior and inferior pubic rami.  No definite acute bony abnormalities. Significantly increased stool in rectum and sigmoid colon.   Original Report Authenticated By: Ulyses Southward, M.D.    Ct Head Wo Contrast  05/04/2012   IMPRESSION: Motion degraded exam.  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  In addition, there are hemorrhagic contusions surrounding the infarct and possibly small amount of subarachnoid blood.  1 cm focal hematoma right periopercular region (series 2 image 38) adjacent to the pterion.  Soft tissue injury right supraorbital region.  No obvious underlying fracture although evaluation limited by motion.  Opacification sphenoid sinus air cells with central hyperdense material probably representing inspissated proteinaceous material consistent with chronic sinusitis.  No obvious skull fracture.  Global atrophy.  Ventricular prominence.  Question mild hydrocephalus superimposed upon the central atrophy.   Ct Cervical Spine Wo Contrast  05/04/2012  mild anterior slip of the C6 facet on the C7 facet. This may be related to patient's head positioning.   Assessment/Plan: Tracie [name] is a 79 y.o. year old female with a history of dementia, HTN, and DM presenting with a head injury following a fall today.   1. Cerebral contusion/facet slip at C6/7 level: patient with these evidenced on CT scan following fall in which she hit her head without loss of consciousness.   -discussed case with neurosurgery, Dr. Jordan Likes, states needs observation, repeat CT scan in the morning, and will formally c/s if there are changes on scan, additionally states no restriction on activity  -neuro checks q2 hr  -patient to be placed in c-collar  -  morphine prn for pain  -MRI head and neck to evaluate for cervical ligamentous injury, unable to obtain last night due to inadequate sedation -repeat CT head today-will try haldol for sedation, though this did not appear to work, will need to discuss additional modes of  sedation  2. Fall: patient with recent history of 2 falls over the past 3-4 months. Family states is wheel chair bound and has difficulty getting around on own. Falls likely related to deconditioning and cognitive impairment. Question of whether this could be related to infection though less likely given normal WBC, no respiratory complaints, and no focal findings on exam -PT/OT eval and treat  -up with assistance  -UA and UCx ordered to evaluate for infectious cause-nurse going to collect now   3. Dementia: patient is on namenda and rivastigmine patch at home. Family states is currently at her baseline. Unable to answer questions, but able to follow simple commands. Was sedated in ED with ativan. -SLP to do bedside swallow study -will continue rivastigmine patch  -will restart namenda once passed bedside swallow  -haldol for any agitation, will not use ativan due to over sedation in ED  -patient on depakote at home ?for seizures vs mood stabilizer-will hold until passes swallow study   4. Hypernatremia: likely related to dehydration given that patient depends on those around her for sustenance. Cr 0.73, so good renal function  -will rehydrate with 1/2 NS @ 100 mL/hr  -continue to follow BMP -encourage PO intake once passes swallow study   5. GERD: on prilosec at home, will hold this until passes swallow study   6. HLD: per history is on lipitor, will hold this medication until passes swallow study   FEN/GI: NPO until passes swallow study, 1/2 NS @ 100 mL/hr  Prophylaxis: SCDs due to falls risk  Disposition: admit to telemetry, discharge pending stable scan and exam in the morning  Code Status: full  Marikay Alar, MD 05/05/2012, 7:15 AM

## 2012-05-05 NOTE — Progress Notes (Signed)
Utilization review completed. Takyra Cantrall, RN, BSN. 

## 2012-05-05 NOTE — Progress Notes (Signed)
FMTS Attending Daily Note: Denny Levy MD [phone] pager office [phone] I  have seen and examined this patient, reviewed their chart. I have discussed this patient with the resident. I agree with the resident's findings, assessment and care plan. Repeat CT scan of head and MRI of neck planned for today,

## 2012-05-06 ENCOUNTER — Inpatient Hospital Stay (HOSPITAL_COMMUNITY): Payer: Medicare Other

## 2012-05-06 LAB — BASIC METABOLIC PANEL
CO2: 25 mEq/L (ref 19–32)
Calcium: 8.7 mg/dL (ref 8.4–10.5)
Chloride: 100 mEq/L (ref 96–112)
Glucose, Bld: 107 mg/dL — ABNORMAL HIGH (ref 70–99)
Potassium: 3.2 mEq/L — ABNORMAL LOW (ref 3.5–5.1)
Sodium: 137 mEq/L (ref 135–145)

## 2012-05-06 LAB — CBC
Hemoglobin: 10.7 g/dL — ABNORMAL LOW (ref 12.0–15.0)
MCH: 28.6 pg (ref 26.0–34.0)
Platelets: 137 10*3/uL — ABNORMAL LOW (ref 150–400)
RBC: 3.74 MIL/uL — ABNORMAL LOW (ref 3.87–5.11)
WBC: 5.8 10*3/uL (ref 4.0–10.5)

## 2012-05-06 LAB — URINE CULTURE: Colony Count: NO GROWTH

## 2012-05-06 LAB — GLUCOSE, CAPILLARY
Glucose-Capillary: 163 mg/dL — ABNORMAL HIGH (ref 70–99)
Glucose-Capillary: 110 mg/dL — ABNORMAL HIGH (ref 70–99)

## 2012-05-06 MED ORDER — PANTOPRAZOLE SODIUM 40 MG PO TBEC
40.0000 mg | DELAYED_RELEASE_TABLET | Freq: Every day | ORAL | Status: DC
  Administered 2012-05-06: 40 mg via ORAL
  Filled 2012-05-06: qty 1

## 2012-05-06 MED ORDER — GLUCERNA SHAKE PO LIQD
237.0000 mL | Freq: Three times a day (TID) | ORAL | Status: DC
  Administered 2012-05-06: 237 mL via ORAL

## 2012-05-06 MED ORDER — GLUCERNA SHAKE PO LIQD: 237.0000 mL | Freq: Three times a day (TID) | ORAL | Status: DC

## 2012-05-06 NOTE — Progress Notes (Signed)
NUTRITION FOLLOW UP  Intervention:   1. Glucerna Shake po daily, each supplement provides 220 kcal and 10 grams of protein.  2. Magic cup BID between meals, each supplement provides 290 kcal and 9 grams of protein.   Nutrition Dx:   Inadequate oral intake now related to decreased appetite/altered diet consistency as evidenced by meal completion < 25%; ongoing.    Goal:  Meet >/=90% estimated nutrition needs; not met.   Monitor:  PO intake, supplement acceptance, weight trends, labs  Assessment:   Pt experienced a fall while at adult daycare and was admitted with cerebral contusion and noted laceration to the right eyebrow with bleeding that has been controlled; possible subarachnoid bleed. Pt with hx of dementia/Alzheimer's. Pt is sometimes able to feed herself.  Pt is in a c-collar neck brace at this time.  Per family pt ate a little of dinner last night but not much. Same at breakfast pt did not eat a lot. Pt is now on Dysphagia 1 diet with thin liquids.  Per pt's RN pt has not ate very much.   Height: Ht Readings from Last 1 Encounters:  No data found for Ht  ~ 5\' 7"  (1.70 m), per son's report   Weight Status:   Wt Readings from Last 1 Encounters:  05/05/12 120 lb 12.8 oz (54.795 kg)    Re-estimated needs:  Kcal: 1400-1600  Protein: 65-75 grams  Fluid: 1.4-1.6 L/day    Skin: left eyebrow laceration  Diet Order: Dysphagia 1 with thin liquids Meal Completion: 25%    Intake/Output Summary (Last 24 hours) at 05/06/12 0936 Last data filed at 05/05/12 1231  Gross per 24 hour  Intake      0 ml  Output      3 ml  Net     -3 ml    Last BM: 2/12   Labs:   Recent Labs Lab 05/04/12 1621 05/05/12 0730  NA 150* 145  K 3.5 3.6  CL 109 107  CO2 29 27  BUN 13 12  CREATININE 0.73 0.74  CALCIUM 9.8 8.9  GLUCOSE 107* 123*    CBG (last 3)   Recent Labs  05/05/12 2208 05/06/12 0648  GLUCAP 163* 110*    Scheduled Meds: . atorvastatin  20 mg Oral Daily   . divalproex  125 mg Oral Daily  . insulin aspart  0-9 Units Subcutaneous TID WC  . memantine  10 mg Oral Daily  . pantoprazole  40 mg Oral Daily  . rivastigmine  9.5 mg Transdermal Daily    Continuous Infusions: . sodium chloride 75 mL/hr at 05/05/12 2249    Kendell Bane RD, LDN, CNSC [phone] Pager [phone] After Hours Pager

## 2012-05-06 NOTE — Progress Notes (Signed)
Pt d/c to home by car with family. Assessment stable. Family verbalizes understanding of d/c instructions. 

## 2012-05-06 NOTE — ED Provider Notes (Signed)
Medical screening examination/treatment/procedure(s) were conducted as a shared visit with non-physician practitioner(s) and myself.  I personally evaluated the patient during the encounter Please see my previous note.  Laray Anger, DO 05/06/12 1552

## 2012-05-06 NOTE — Discharge Summary (Signed)
Physician Discharge Summary  Patient ID: Tracie [name] MRN: [id number] DOB: December 10, 1928 Age: 79 y.o.  Admit date: 05/04/2012 Discharge date: 05/06/2012 Admitting Physician: Nestor Ramp, MD  PCP: Default, Provider, MD  Consultants: none     Discharge Diagnosis: Principal Problem:   Cerebral contusion Active Problems:   Laceration of head   Dementia    Hospital Course Tracie [name] is a 79 y.o. year old female with a history of dementia, HTN, and DM presenting with a head injury following a fall today.   1. Cerebral contusion/facet slip at C6/7 level: patient presented following fall at adult day care in which she hit her head. In the ED she had a laceration above her right eyebrow sutured. She additionally had a CT scan of her head that revealed a cerebral contusion that is fully outlined in the imaging section and question of a C6/7 facet slip. Dr. Jordan Likes of neurosurgery was spoken to over the phone and he reviewed the scans. Asked that we admit the patient for observation and repeat CT head in the morning. Neuro checks were done every 2 hours. Repeat scans were attempted and the patient was unable to remain still even with sedation with haldol. We obtained a flex-ex of her c-spine that outlined likely chronic changes. The patient was stable and at her baseline at the time of discharge.  2. Fall: fell while being bathed at adult daycare. Patient with recent history of 2 falls over the past 3-4 months. Family states is wheel chair bound and has difficulty getting around on own. Falls likely related to deconditioning and cognitive impairment. Infectious work-up did not reveal a source as a cause of recent falls.   3. Dementia: patient is on namenda and rivastigmine patch at home. Family states is currently at her baseline. Unable to answer questions, but able to follow simple commands. Speech evaluated and recommended dysphagia 1 diet. Initially namenda and depakote held until SLP evaluation.  These were restarted once patient pass swallow study.   4. Hypernatremia: initially 150 on admission. Likely related to dehydration given patient relies on others for liquid intake. Rehydrated with 1/2 NS @ 100 mL/hr. Was resolved at time of discharge.   5. GERD: restarted PPI once passed swallow study   6. HLD: restarted lipitor as passed swallow study   Problem List 1. Cerebral contusion 2. C6/7 facet slip 3. Fall 4. Laceration over right eyebrow 5. Dementia 6. Hypernatremia 7. GERD 8. HLD         Discharge PE   Filed Vitals:   05/06/12 0940  BP: 149/72  Pulse: 83  Temp: 97.9 F (36.6 C)  Resp: 18   General: no acute distress, responsive to voice  HEENT: C-collar in place, PERRL  Cardiovascular: rrr, no mrg  Respiratory: CTAB, no wheezes or crackles  Abdomen: Soft, NT, ND  Extremities: no edema  Skin: 6 cm laceration above right eyelid  Neuro: non-verbal, responds to voice, would open eyes on command, moving all 4 extremities equally, no focal deficits   Procedures/Imaging:  Dg Hip Bilateral W/pelvis  05/04/2012 IMPRESSION: Osseous demineralization. Probably old fractures of the left superior and inferior pubic rami. No definite acute bony abnormalities. Significantly increased stool in rectum and sigmoid colon. Original Report Authenticated By: Ulyses Southward, M.D.   Ct Head Wo Contrast  05/04/2012 IMPRESSION: Motion degraded exam. 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 In addition, there are hemorrhagic contusions surrounding the infarct and possibly small amount of  subarachnoid blood. 1 cm focal hematoma right periopercular region (series 2 image 38) adjacent to the pterion. Soft tissue injury right supraorbital region. No obvious underlying fracture although evaluation limited by motion. Opacification sphenoid sinus air cells with central hyperdense material probably representing inspissated proteinaceous material consistent with chronic  sinusitis. No obvious skull fracture. Global atrophy. Ventricular prominence. Question mild hydrocephalus superimposed upon the central atrophy.   Ct Cervical Spine Wo Contrast  05/04/2012 mild anterior slip of the C6 facet on the C7 facet. This may be related to patient's head positioning.   Ct Head Wo Contrast  05/05/2012 Markedly motion degraded exam. Difficult to assess for change of intracranial hemorrhage. Only noticeable change is shifting of tiny amount of subarachnoid blood left hemisphere. This will require follow-up without motion when the patient is able.   Ct Cervical Spine Wo Contrast  05/05/2012 IMPRESSION: Exam is markedly motion degraded and limited for excluding cervical spine fracture. No cervical spine fracture is noted on recent prior examination. Kyphosis remains centered at the C5-6 level. This finding is new compared to 2005. It is possible this is related to head positioning/muscle spasm although ligamentous injury cannot be excluded in the proper clinical setting. Original Report Authenticated By: Lacy Duverney, M.D.   Flex-ex C-spine 05/06/12 No soft tissue swelling. No discernible fracture. There is chronic spondylosis at C3-4 with disc space narrowing and marginal osteophytes. There is anterolisthesis at C4-5 of 2-3 mm that does not change significantly with flexion and extension. There is anterolisthesis at C5-6 of 4 mm that largely reduces with extension. Detail is limited below the level of C6. Looking at the previous available imaging, I suspect these findings or chronic and not related to the acute injury.  Labs  CBC  Recent Labs Lab 05/04/12 1621 05/05/12 0730 05/06/12 0900  WBC 7.7 6.5 5.8  HGB 12.5 12.0 10.7*  HCT 39.7 36.5 31.8*  PLT 137* 136* 137*   BMET  Recent Labs Lab 05/04/12 1621 05/05/12 0730 05/06/12 0900  NA 150* 145 137  K 3.5 3.6 3.2*  CL 109 107 100  CO2 29 27 25   BUN 13 12 9   CREATININE 0.73 0.74 0.65  CALCIUM 9.8 8.9 8.7  GLUCOSE  107* 123* 107*   Results for orders placed during the hospital encounter of 05/04/12 (from the past 72 hour(s))  BASIC METABOLIC PANEL     Status: Abnormal   Collection Time    05/04/12  4:21 PM      Result Value Range   Sodium 150 (*) 135 - 145 mEq/L   Potassium 3.5  3.5 - 5.1 mEq/L   Chloride 109  96 - 112 mEq/L   CO2 29  19 - 32 mEq/L   Glucose, Bld 107 (*) 70 - 99 mg/dL   BUN 13  6 - 23 mg/dL   Creatinine, Ser 4.69  0.50 - 1.10 mg/dL   Calcium 9.8  8.4 - 62.9 mg/dL   GFR calc non Af Amer 78 (*) >90 mL/min   GFR calc Af Amer >90  >90 mL/min   Comment:            The eGFR has been calculated     using the CKD EPI equation.     This calculation has not been     validated in all clinical     situations.     eGFR's persistently     <90 mL/min signify     possible Chronic Kidney Disease.  CBC WITH DIFFERENTIAL  Status: Abnormal   Collection Time    05/04/12  4:21 PM      Result Value Range   WBC 7.7  4.0 - 10.5 K/uL   RBC 4.52  3.87 - 5.11 MIL/uL   Hemoglobin 12.5  12.0 - 15.0 g/dL   HCT 16.1  09.6 - 04.5 %   MCV 87.8  78.0 - 100.0 fL   MCH 27.7  26.0 - 34.0 pg   MCHC 31.5  30.0 - 36.0 g/dL   RDW 40.9  81.1 - 91.4 %   Platelets 137 (*) 150 - 400 K/uL   Neutrophils Relative 75  43 - 77 %   Neutro Abs 5.8  1.7 - 7.7 K/uL   Lymphocytes Relative 20  12 - 46 %   Lymphs Abs 1.5  0.7 - 4.0 K/uL   Monocytes Relative 5  3 - 12 %   Monocytes Absolute 0.4  0.1 - 1.0 K/uL   Eosinophils Relative 0  0 - 5 %   Eosinophils Absolute 0.0  0.0 - 0.7 K/uL   Basophils Relative 0  0 - 1 %   Basophils Absolute 0.0  0.0 - 0.1 K/uL  PROTIME-INR     Status: None   Collection Time    05/04/12  4:21 PM      Result Value Range   Prothrombin Time 13.8  11.6 - 15.2 seconds   INR 1.07  0.00 - 1.49  BASIC METABOLIC PANEL     Status: Abnormal   Collection Time    05/05/12  7:30 AM      Result Value Range   Sodium 145  135 - 145 mEq/L   Potassium 3.6  3.5 - 5.1 mEq/L   Chloride 107  96 -  112 mEq/L   CO2 27  19 - 32 mEq/L   Glucose, Bld 123 (*) 70 - 99 mg/dL   BUN 12  6 - 23 mg/dL   Creatinine, Ser 7.82  0.50 - 1.10 mg/dL   Calcium 8.9  8.4 - 95.6 mg/dL   GFR calc non Af Amer 78 (*) >90 mL/min   GFR calc Af Amer >90  >90 mL/min   Comment:            The eGFR has been calculated     using the CKD EPI equation.     This calculation has not been     validated in all clinical     situations.     eGFR's persistently     <90 mL/min signify     possible Chronic Kidney Disease.  CBC     Status: Abnormal   Collection Time    05/05/12  7:30 AM      Result Value Range   WBC 6.5  4.0 - 10.5 K/uL   RBC 4.22  3.87 - 5.11 MIL/uL   Hemoglobin 12.0  12.0 - 15.0 g/dL   HCT 21.3  08.6 - 57.8 %   MCV 86.5  78.0 - 100.0 fL   MCH 28.4  26.0 - 34.0 pg   MCHC 32.9  30.0 - 36.0 g/dL   RDW 46.9  62.9 - 52.8 %   Platelets 136 (*) 150 - 400 K/uL  URINALYSIS, ROUTINE W REFLEX MICROSCOPIC     Status: Abnormal   Collection Time    05/05/12 10:56 AM      Result Value Range   Color, Urine YELLOW  YELLOW   APPearance CLEAR  CLEAR   Specific Gravity, Urine 1.020  1.005 - 1.030   pH 7.0  5.0 - 8.0   Glucose, UA NEGATIVE  NEGATIVE mg/dL   Hgb urine dipstick NEGATIVE  NEGATIVE   Bilirubin Urine SMALL (*) NEGATIVE   Ketones, ur NEGATIVE  NEGATIVE mg/dL   Protein, ur NEGATIVE  NEGATIVE mg/dL   Urobilinogen, UA 2.0 (*) 0.0 - 1.0 mg/dL   Nitrite NEGATIVE  NEGATIVE   Leukocytes, UA NEGATIVE  NEGATIVE   Comment: MICROSCOPIC NOT DONE ON URINES WITH NEGATIVE PROTEIN, BLOOD, LEUKOCYTES, NITRITE, OR GLUCOSE <1000 mg/dL.  URINE CULTURE     Status: None   Collection Time    05/05/12 10:56 AM      Result Value Range   Specimen Description URINE, CATHETERIZED     Special Requests NONE     Culture  Setup Time 05/05/2012 12:19     Colony Count NO GROWTH     Culture NO GROWTH     Report Status 05/06/2012 FINAL    GLUCOSE, CAPILLARY     Status: Abnormal   Collection Time    05/05/12 10:08 PM       Result Value Range   Glucose-Capillary 163 (*) 70 - 99 mg/dL  GLUCOSE, CAPILLARY     Status: Abnormal   Collection Time    05/06/12  6:48 AM      Result Value Range   Glucose-Capillary 110 (*) 70 - 99 mg/dL  CBC     Status: Abnormal   Collection Time    05/06/12  9:00 AM      Result Value Range   WBC 5.8  4.0 - 10.5 K/uL   RBC 3.74 (*) 3.87 - 5.11 MIL/uL   Hemoglobin 10.7 (*) 12.0 - 15.0 g/dL   HCT 16.1 (*) 09.6 - 04.5 %   MCV 85.0  78.0 - 100.0 fL   MCH 28.6  26.0 - 34.0 pg   MCHC 33.6  30.0 - 36.0 g/dL   RDW 40.9  81.1 - 91.4 %   Platelets 137 (*) 150 - 400 K/uL  BASIC METABOLIC PANEL     Status: Abnormal   Collection Time    05/06/12  9:00 AM      Result Value Range   Sodium 137  135 - 145 mEq/L   Potassium 3.2 (*) 3.5 - 5.1 mEq/L   Chloride 100  96 - 112 mEq/L   CO2 25  19 - 32 mEq/L   Glucose, Bld 107 (*) 70 - 99 mg/dL   BUN 9  6 - 23 mg/dL   Creatinine, Ser 7.82  0.50 - 1.10 mg/dL   Calcium 8.7  8.4 - 95.6 mg/dL   GFR calc non Af Amer 81 (*) >90 mL/min   GFR calc Af Amer >90  >90 mL/min   Comment:            The eGFR has been calculated     using the CKD EPI equation.     This calculation has not been     validated in all clinical     situations.     eGFR's persistently     <90 mL/min signify     possible Chronic Kidney Disease.       Patient condition at time of discharge/disposition: stable  Disposition-home   Follow up issues: 1. Please follow up with your neurologist as scheduled on Friday  Discharge follow up:  Follow-up Information   Please follow up. (please call your primary doctor for follow-up appointment as soon as possible)  Discharge Instructions: Please refer to Patient Instructions section of EMR for full details.  Patient was counseled important signs and symptoms that should prompt return to medical care, changes in medications, dietary instructions, activity restrictions, and follow up appointments.    Discharge Medications    Medication List    TAKE these medications       atorvastatin 20 MG tablet  Commonly known as:  LIPITOR  Take 20 mg by mouth daily.     divalproex 125 MG DR tablet  Commonly known as:  DEPAKOTE  Take 125 mg by mouth daily.     feeding supplement Liqd  Take 237 mLs by mouth 3 (three) times daily between meals.     memantine 10 MG tablet  Commonly known as:  NAMENDA  Take 10 mg by mouth daily.     omeprazole 20 MG capsule  Commonly known as:  PRILOSEC  Take 20 mg by mouth daily.     rivastigmine 9.5 mg/24hr  Commonly known as:  EXELON  Place 1 patch onto the skin daily.       Marikay Alar, MD of [facility] 05/07/2012 10:49 PM

## 2012-05-06 NOTE — Progress Notes (Signed)
FMTS Attending Daily Note: Denny Levy MD [phone] pager office [phone] I  have seen and examined this patient, reviewed their chart. I have discussed this patient with the resident. I agree with the resident's findings, assessment and care plan. The NSU saw the CT head and C spine yestwerday. We were unable to get  her to hold still for MRI even with adequate sedation. NSU says they are reassured from repeat imaging (CT spine and head) yesterday and it is OK for her ot be d/c home without C collar and no specific follow up needed. I would like to get flexion and extension films if possible, mostly as a baseline in case any future problems or neck issues. If unable to obtain, will likely d/c anyway as she has no other issues.
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
## 2012-05-06 NOTE — Consult Note (Cosign Needed)
Tracie Kernen EdD 

## 2012-05-06 NOTE — Progress Notes (Signed)
Speech Language Pathology Dysphagia Treatment Patient Details Name: Tracie [name] MRN: [id number] DOB: 1929/03/17 Today's Date: 05/06/2012 Time: 1330-1400 SLP Time Calculation (min): 30 min  Assessment / Plan / Recommendation Clinical Impression   Focus of treatment to assess patient's tolerance of current diet of dysphagia 1 and thin liquids and for possible upgrade prior to d/c home.  Patient with intermittent LOA but easily arousable for brief moments to safely administer Soft solids x3. No outward s/s of aspiration noted with upgraded trials but patient noted with oral holding and fall asleep.  Recommend to continue current diet of puree and thin liquids with full supervision mainly due to LOA to decrease choking risk. Recommend continued ST treatment at next venue of care to ensure safety for possible diet upgrade and to provide education on swallow strategies to caregivers.        Diet Recommendation  Continue with Current Diet: Dysphagia 1 (puree);Thin liquid    SLP Plan Continue with current plan of care      Swallowing Goals  SLP Swallowing Goals Swallow Study Goal #1 - Progress: Progressing toward goal Swallow Study Goal #2 - Progress: Progressing toward goal  General Temperature Spikes Noted: No Respiratory Status: Room air Behavior/Cognition: Lethargic;Cooperative;Requires cueing Patient Positioning: Upright in bed  Oral Cavity - Oral Hygiene Does patient have any of the following "at risk" factors?: None of the above Patient is HIGH RISK - Oral Care Protocol followed (see row info): Yes Patient is AT RISK - Oral Care Protocol followed (see row info): Yes   Dysphagia Treatment Treatment focused on: Skilled observation of diet tolerance;Upgraded PO texture trials;Patient/family/caregiver education;Facilitation of oral preparatory phase;Facilitation of pharyngeal phase;Facilitation of oral phase Family/Caregiver Educated: Focus of diagnostic treatment to assess diet  tolerance of dysphagia 1 and thin liquids and for possible diet advancement.  Patient continues with intermittent LOA but able to arouse for brief moments.  Patient administered PO trials of soft solids.  No observed s/s of aspiration  Treatment Methods/Modalities: Skilled observation;Differential diagnosis Patient observed directly with PO's: Yes Type of PO's observed: Dysphagia 2 (chopped) Feeding: Total assist Liquids provided via: Cup;Straw Oral Phase Signs & Symptoms: Prolonged bolus formation;Prolonged mastication;Prolonged oral phase Type of cueing: Verbal Amount of cueing: Moderate   GO    Moreen Fowler MS, CCC-SLP [phone] [facility] 05/06/2012, 2:19 PM

## 2012-05-07 LAB — GLUCOSE, CAPILLARY: Glucose-Capillary: 222 mg/dL — ABNORMAL HIGH (ref 70–99)

## 2012-05-08 NOTE — Discharge Summary (Signed)
Family Medicine Teaching Service  Discharge Note : Attending Swannie Milius MD Pager [phone] Office [phone] I have seen and examined this patient, reviewed their chart and discussed discharge planning wit the resident at the time of discharge. I agree with the discharge plan as above.  

## 2012-07-08 ENCOUNTER — Ambulatory Visit (INDEPENDENT_AMBULATORY_CARE_PROVIDER_SITE_OTHER): Payer: Medicare Other | Admitting: Nurse Practitioner

## 2012-07-08 ENCOUNTER — Encounter: Payer: Self-pay | Admitting: Nurse Practitioner

## 2012-07-08 VITALS — BP 102/56 | HR 58 | Temp 96.2°F | Resp 20

## 2012-07-08 DIAGNOSIS — L8992 Pressure ulcer of unspecified site, stage 2: Secondary | ICD-10-CM

## 2012-07-08 DIAGNOSIS — R5381 Other malaise: Secondary | ICD-10-CM

## 2012-07-08 DIAGNOSIS — L8990 Pressure ulcer of unspecified site, unspecified stage: Secondary | ICD-10-CM

## 2012-07-08 DIAGNOSIS — R5383 Other fatigue: Secondary | ICD-10-CM

## 2012-07-08 NOTE — Patient Instructions (Addendum)
Pressure Ulcer  A pressure ulcer is a sore where the skin breaks down and exposes deeper layers of tissue. It develops in areas of the body where there is unrelieved pressure. Pressure ulcers are usually found over a boney area, such as the shoulder blades, spine, lower back, hips, knees, ankles, and heels.  CAUSES    Decreased ability to move.   Decreased ability to feel pain or discomfort.   Moisture from urine or stool.   Poor nutrition.   Pulling sheets that are under a patient when changing his or her position.  STAGING PRESSURE ULCERS  Your caregiver may determine the degree of severity (stage) of your pressure ulcer. The stages include:   Stage 1: The skin is red, and when the skin is pressed, it stays red.   Stage 2: The top layer of skin is gone, and there is a shallow, pink ulcer.   Stage 3: The ulcer becomes deeper, and it is more difficult to see the whole wound. Also, there may be yellow or brown parts, as well as pink and red parts.   Stage 4: The ulcer may be deep and red, pink, brown, white, or yellow. Bone or muscle may be seen.   Unstageable pressure ulcer: The ulcer is covered almost completely with black, brown, or yellow tissue. It is not known how deep the ulcer is or what stage it is until this covering comes off.   Suspected deep tissue injury: A patient's skin can be injured from pressure or pulling on the skin when his or her position is changed. The skin appears purple or maroon. There may not be an opening in the skin, but there could be a blood-filled blister. This deep tissue injury is often difficult to see in people with darker skin tones. The skin may go back to normal when pressure is relieved, or the site may open up and become deeper in time.  DIAGNOSIS   Your caregiver will diagnose your pressure ulcer based on its appearance. Your caregiver may determine the stage of your pressure ulcer as well. Your caregiver may request tests to check for infection, assess your  circulation, or to check for other diseases, such as diabetes.  TREATMENT   Treatment of your pressure ulcer begins with determining what stage the ulcer is in. Your treatment team may include your caregiver, a wound care specialist, a nutritionist, a physical therapist, and a surgeon. Treatments include:    Moving or repositioning every 1 to 2 hours.   Using beds or mattresses to shift your body weight and pressure points frequently.   Improving your diet.   Cleaning and bandaging (dressing) the open wound.   Giving antibiotic medicines.   Removing damaged tissue.   Surgery and sometimes skin grafts.  HOME CARE INSTRUCTIONS   Follow the care plan that was started in the hospital.   Avoid staying in the same position for more than 2 hours. Use padding, devices, or mattresses to cushion your pressure points as directed by your caregiver.   Eat well. Take nutritional supplements and vitamins as directed by your caregiver.   Keep all follow-up appointments.   Take pain medicine as directed by your caregiver.  SEEK MEDICAL CARE IF:    Your pressure ulcer is not improving.   You do not know how to care for your pressure ulcer.   You notice other areas of redness on your skin.  SEEK IMMEDIATE MEDICAL CARE IF:    You have increasing redness,   swelling, or pain in your pressure ulcer.   You notice pus, or increased pus, coming from your pressure ulcer.   You have a fever.   You notice a bad smell coming from the wound or dressing.   Your pressure ulcer opens up again.  MAKE SURE YOU:    Understand these instructions.   Will watch your condition.   Will get help right away if you are not doing well or get worse.  Document Released: 03/11/2005 Document Revised: 06/03/2011 Document Reviewed: 10/26/2010  [employer] Patient Information 2013 [employer].

## 2012-07-08 NOTE — Progress Notes (Signed)
Patient ID: Tracie [name], female   DOB: 10-13-1928, 79 y.o.   MRN: [id number] No Known Allergies  Chief Complaint  Patient presents with  . ulcer on bottom  . possible boil as well  . sleeps all the time    HPI: Patient is a 79 y.o. female seen in the office today for ulcer on bottom that appeared yesterday Lately she is not eating well and has been sleeping all the time No fevers per family   Review of Systems: Review of Systems  Unable to perform ROS: dementia     Past Medical History  Diagnosis Date  . Stroke   . Dementia   . Hypertension   . Diabetes mellitus   . Laceration of skin of eyelid and periocular area   . Other fall   . Wheelchair dependence   . Hypopotassemia   . Encounters for unspecified administrative purpose   . Other and unspecified hyperlipidemia   . Reflux esophagitis   . Blood in stool   . Routine general medical examination at a health care facility   . Secondary diabetes mellitus with unspecified complication, uncontrolled   . Chronic kidney disease, stage III (moderate)   . Hypopotassemia   . Dementia in conditions classified elsewhere with behavioral disturbance(294.11)   . Dementia in conditions classified elsewhere without behavioral disturbance(294.10)   . Loss of weight   . Cough   . Unspecified vitamin D deficiency   . Thrombocytopenia, unspecified   . Unspecified disorder of kidney and ureter   . Other and unspecified hyperlipidemia   . Senile dementia, uncomplicated   . Anxiety state, unspecified   . Unspecified essential hypertension   . Unspecified late effects of cerebrovascular disease   . Other malaise and fatigue   . Abnormality of gait    Past Surgical History  Procedure Laterality Date  . Joint replacement  2008    Dr Renae Fickle  . Abdominal hysterectomy  1981   Social History:   reports that she has never smoked. She has never used smokeless tobacco. She reports that she does not drink alcohol or use illicit  drugs.  History reviewed. No pertinent family history.  Medications: Patient's Medications  New Prescriptions   No medications on file  Previous Medications   ATORVASTATIN (LIPITOR) 20 MG TABLET    Take 20 mg by mouth daily.     DIVALPROEX (DEPAKOTE) 125 MG DR TABLET    Take 125 mg by mouth daily.     FEEDING SUPPLEMENT (GLUCERNA SHAKE) LIQD    Take 237 mLs by mouth 3 (three) times daily between meals.   MEMANTINE (NAMENDA) 10 MG TABLET    Take 10 mg by mouth daily.     OMEPRAZOLE (PRILOSEC) 20 MG CAPSULE    Take 20 mg by mouth daily.     RIVASTIGMINE (EXELON) 9.5 MG/24HR    Place 1 patch onto the skin daily.    Modified Medications   No medications on file  Discontinued Medications   No medications on file     Physical Exam: Physical Exam  Constitutional: She appears lethargic. She appears cachectic. She appears ill.  Cardiovascular: Normal rate and regular rhythm.   Pulmonary/Chest: Effort normal and breath sounds normal.  Neurological: She appears lethargic.  Skin:  Stage 2 pressure ulcer on left hip and left buttocks     Filed Vitals:   07/08/12 1641  BP: 102/56  Pulse: 58  Temp: 96.2 F (35.7 C)  Resp: 20  Labs reviewed: Basic Metabolic Panel:  Recent Labs  16/10/96 1621 05/05/12 0730 05/06/12 0900  NA 150* 145 137  K 3.5 3.6 3.2*  CL 109 107 100  CO2 29 27 25   GLUCOSE 107* 123* 107*  BUN 13 12 9   CREATININE 0.73 0.74 0.65  CALCIUM 9.8 8.9 8.7   Liver Function Tests:  Recent Labs  08/15/11 1207  AST 19  ALT 14  ALKPHOS 122*  BILITOT 0.4  PROT 7.8  ALBUMIN 4.2   No results found for this basename: LIPASE, AMYLASE,  in the last 8760 hours No results found for this basename: AMMONIA,  in the last 8760 hours CBC:  Recent Labs  05/04/12 1621 05/05/12 0730 05/06/12 0900  WBC 7.7 6.5 5.8  NEUTROABS 5.8  --   --   HGB 12.5 12.0 10.7*  HCT 39.7 36.5 31.8*  MCV 87.8 86.5 85.0  PLT 137* 136* 137*     Assessment/Plan  AMS- will  check for UTI by I&o cath- will check lab work Encouraged good hydration and nutrition at this time.  Stage 2 pressure ulcers- educated family to keep pressure off these areas, pressure reducing surfaces, increasing calories and nutrition to allow for healing, to add supplements if needed, call or seek medical attention if there becomes a foul odor or increased drainage Labs/tests ordered: UA C&S, CBC, CMP

## 2012-07-09 LAB — CBC WITH DIFFERENTIAL/PLATELET
Basos: 0 % (ref 0–3)
Eos: 0 % (ref 0–5)
HCT: 41.3 % (ref 34.0–46.6)
Hemoglobin: 13 g/dL (ref 11.1–15.9)
Immature Granulocytes: 0 % (ref 0–2)
Lymphocytes Absolute: 2 10*3/uL (ref 0.7–3.1)
MCV: 89 fL (ref 79–97)
Monocytes Absolute: 0.6 10*3/uL (ref 0.1–0.9)
Monocytes: 8 % (ref 4–12)
Neutrophils Absolute: 5.8 10*3/uL (ref 1.4–7.0)
RBC: 4.65 x10E6/uL (ref 3.77–5.28)
WBC: 8.5 10*3/uL (ref 3.4–10.8)

## 2012-07-09 LAB — URINALYSIS
Glucose, UA: NEGATIVE
RBC, UA: NEGATIVE
pH, UA: 6 (ref 5.0–7.5)

## 2012-07-09 LAB — COMPREHENSIVE METABOLIC PANEL
Albumin/Globulin Ratio: 1.5 (ref 1.1–2.5)
Albumin: 3.8 g/dL (ref 3.5–4.7)
Alkaline Phosphatase: 130 IU/L — ABNORMAL HIGH (ref 39–117)
BUN/Creatinine Ratio: 26 (ref 11–26)
BUN: 20 mg/dL (ref 8–27)
CO2: 26 mmol/L (ref 19–28)
Creatinine, Ser: 0.76 mg/dL (ref 0.57–1.00)
GFR calc Af Amer: 85 mL/min/{1.73_m2} (ref >59)
GFR calc non Af Amer: 74 mL/min/{1.73_m2} (ref >59)
Globulin, Total: 2.6 g/dL (ref 1.5–4.5)

## 2012-07-10 LAB — URINE CULTURE

## 2012-07-12 ENCOUNTER — Emergency Department (HOSPITAL_COMMUNITY): Payer: Medicare Other

## 2012-07-12 ENCOUNTER — Encounter (HOSPITAL_COMMUNITY): Payer: Self-pay

## 2012-07-12 ENCOUNTER — Inpatient Hospital Stay (HOSPITAL_COMMUNITY)
Admission: EM | Admit: 2012-07-12 | Discharge: 2012-07-15 | DRG: 641 | Disposition: A | Discharge status: Skilled Nursing Facility | Payer: Medicare Other | Attending: Internal Medicine | Admitting: Internal Medicine

## 2012-07-12 DIAGNOSIS — N390 Urinary tract infection, site not specified: Secondary | ICD-10-CM | POA: Diagnosis present

## 2012-07-12 DIAGNOSIS — R5383 Other fatigue: Secondary | ICD-10-CM | POA: Insufficient documentation

## 2012-07-12 DIAGNOSIS — K2100 Gastro-esophageal reflux disease with esophagitis, without bleeding: Secondary | ICD-10-CM | POA: Diagnosis present

## 2012-07-12 DIAGNOSIS — Z23 Encounter for immunization: Secondary | ICD-10-CM

## 2012-07-12 DIAGNOSIS — Z9660 Presence of unspecified orthopedic joint implant: Secondary | ICD-10-CM

## 2012-07-12 DIAGNOSIS — L8992 Pressure ulcer of unspecified site, stage 2: Secondary | ICD-10-CM | POA: Diagnosis present

## 2012-07-12 DIAGNOSIS — L89109 Pressure ulcer of unspecified part of back, unspecified stage: Secondary | ICD-10-CM | POA: Diagnosis present

## 2012-07-12 DIAGNOSIS — A498 Other bacterial infections of unspecified site: Secondary | ICD-10-CM | POA: Diagnosis present

## 2012-07-12 DIAGNOSIS — F0150 Vascular dementia without behavioral disturbance: Secondary | ICD-10-CM | POA: Diagnosis present

## 2012-07-12 DIAGNOSIS — I69359 Hemiplegia and hemiparesis following cerebral infarction affecting unspecified side: Secondary | ICD-10-CM

## 2012-07-12 DIAGNOSIS — E559 Vitamin D deficiency, unspecified: Secondary | ICD-10-CM | POA: Diagnosis present

## 2012-07-12 DIAGNOSIS — E785 Hyperlipidemia, unspecified: Secondary | ICD-10-CM | POA: Diagnosis present

## 2012-07-12 DIAGNOSIS — I129 Hypertensive chronic kidney disease with stage 1 through stage 4 chronic kidney disease, or unspecified chronic kidney disease: Secondary | ICD-10-CM | POA: Diagnosis present

## 2012-07-12 DIAGNOSIS — Z79899 Other long term (current) drug therapy: Secondary | ICD-10-CM

## 2012-07-12 DIAGNOSIS — R627 Adult failure to thrive: Secondary | ICD-10-CM | POA: Diagnosis present

## 2012-07-12 DIAGNOSIS — E860 Dehydration: Secondary | ICD-10-CM

## 2012-07-12 DIAGNOSIS — I69959 Hemiplegia and hemiparesis following unspecified cerebrovascular disease affecting unspecified side: Secondary | ICD-10-CM

## 2012-07-12 DIAGNOSIS — E119 Type 2 diabetes mellitus without complications: Secondary | ICD-10-CM | POA: Diagnosis present

## 2012-07-12 DIAGNOSIS — E870 Hyperosmolality and hypernatremia: Principal | ICD-10-CM | POA: Diagnosis present

## 2012-07-12 DIAGNOSIS — F0390 Unspecified dementia without behavioral disturbance: Secondary | ICD-10-CM

## 2012-07-12 DIAGNOSIS — E876 Hypokalemia: Secondary | ICD-10-CM | POA: Diagnosis present

## 2012-07-12 DIAGNOSIS — N183 Chronic kidney disease, stage 3 unspecified: Secondary | ICD-10-CM | POA: Diagnosis present

## 2012-07-12 DIAGNOSIS — Z7401 Bed confinement status: Secondary | ICD-10-CM

## 2012-07-12 LAB — COMPREHENSIVE METABOLIC PANEL
ALT: 14 U/L (ref 0–35)
Alkaline Phosphatase: 108 U/L (ref 39–117)
CO2: 28 mEq/L (ref 19–32)
GFR calc Af Amer: 89 mL/min — ABNORMAL LOW (ref >90)
GFR calc non Af Amer: 77 mL/min — ABNORMAL LOW (ref >90)
Glucose, Bld: 152 mg/dL — ABNORMAL HIGH (ref 70–99)
Potassium: 3.8 mEq/L (ref 3.5–5.1)
Sodium: 150 mEq/L — ABNORMAL HIGH (ref 135–145)
Total Bilirubin: 0.4 mg/dL (ref 0.3–1.2)

## 2012-07-12 LAB — URINALYSIS, ROUTINE W REFLEX MICROSCOPIC
Bilirubin Urine: NEGATIVE
Ketones, ur: 15 mg/dL — AB
Nitrite: POSITIVE — AB
Urobilinogen, UA: 1 mg/dL (ref 0.0–1.0)
pH: 5.5 (ref 5.0–8.0)

## 2012-07-12 LAB — CBC WITH DIFFERENTIAL/PLATELET
Lymphocytes Relative: 15 % (ref 12–46)
Lymphs Abs: 1.7 10*3/uL (ref 0.7–4.0)
MCV: 89.9 fL (ref 78.0–100.0)
Neutrophils Relative %: 78 % — ABNORMAL HIGH (ref 43–77)
Platelets: 124 10*3/uL — ABNORMAL LOW (ref 150–400)
RBC: 4.24 MIL/uL (ref 3.87–5.11)
WBC: 10.9 10*3/uL — ABNORMAL HIGH (ref 4.0–10.5)

## 2012-07-12 LAB — POCT I-STAT 3, ART BLOOD GAS (G3+)
Bicarbonate: 29.9 mEq/L — ABNORMAL HIGH (ref 20.0–24.0)
Patient temperature: 98
pH, Arterial: 7.442 (ref 7.350–7.450)

## 2012-07-12 LAB — POCT I-STAT TROPONIN I

## 2012-07-12 LAB — URINE MICROSCOPIC-ADD ON

## 2012-07-12 MED ORDER — SODIUM CHLORIDE 0.9 % IV BOLUS (SEPSIS)
1000.0000 mL | Freq: Once | INTRAVENOUS | Status: AC
  Administered 2012-07-12: 1000 mL via INTRAVENOUS

## 2012-07-12 MED ORDER — THIAMINE HCL 100 MG/ML IJ SOLN
100.0000 mg | Freq: Once | INTRAMUSCULAR | Status: AC
Start: 2012-07-12 — End: 2012-07-12
  Administered 2012-07-12: 100 mg via INTRAVENOUS
  Filled 2012-07-12: qty 2

## 2012-07-12 NOTE — ED Notes (Signed)
Respiratory called for ABG

## 2012-07-12 NOTE — ED Notes (Addendum)
Pt from home with weakness.  Pt has been noted to be more weak than her norm throughout the day.  Pt disoriented x4 upon arrival.  Per EMS pt's family reports some altered mental status as well. Pt has hx of previous CVA with right sided weakness.  Pt favoring right side on arrival.  Pt also has 4 bedsores reported by EMS that the family and pt's doctor is aware of.  Bandaids noted to right arm and hip upon arrival. IV access attempted x2 by EMS PTA.

## 2012-07-12 NOTE — ED Provider Notes (Addendum)
History     CSN: [id number]  Arrival date & time 07/12/12  2033   First MD Initiated Contact with Patient 07/12/12 2110      Chief Complaint  Patient presents with  . Weakness   Level 5 caveat dementia (Consider location/radiation/quality/duration/timing/severity/associated sxs/prior treatment) HPI History is obtained the patient's daughter and husband. Patient less responsive since today. Lives at home t. Patient normally alert and awake and somewhat talkative. Patient has been less interactive and has been eating less for the past few days. Patient is bed bound at baseline and has left-sided hemiplegia as result stroke. Past Medical History  Diagnosis Date  . Stroke   . Dementia   . Hypertension   . Diabetes mellitus   . Laceration of skin of eyelid and periocular area   . Other fall   . Wheelchair dependence   . Hypopotassemia   . Encounters for unspecified administrative purpose   . Other and unspecified hyperlipidemia   . Reflux esophagitis   . Blood in stool   . Routine general medical examination at a health care facility   . Secondary diabetes mellitus with unspecified complication, uncontrolled   . Chronic kidney disease, stage III (moderate)   . Hypopotassemia   . Dementia in conditions classified elsewhere with behavioral disturbance(294.11)   . Dementia in conditions classified elsewhere without behavioral disturbance(294.10)   . Loss of weight   . Cough   . Unspecified vitamin D deficiency   . Thrombocytopenia, unspecified   . Unspecified disorder of kidney and ureter   . Other and unspecified hyperlipidemia   . Senile dementia, uncomplicated   . Anxiety state, unspecified   . Unspecified essential hypertension   . Unspecified late effects of cerebrovascular disease   . Other malaise and fatigue   . Abnormality of gait     Past Surgical History  Procedure Laterality Date  . Joint replacement  2008    Dr Renae Fickle  . Abdominal hysterectomy  1981     History reviewed. No pertinent family history.  History  Substance Use Topics  . Smoking status: Never Smoker   . Smokeless tobacco: Never Used  . Alcohol Use: No    OB History   Grav Para Term Preterm Abortions TAB SAB Ect Mult Living                  Review of Systems  Unable to perform ROS: Dementia    Allergies  Review of patient's allergies indicates no known allergies.  Home Medications   Current Outpatient Rx  Name  Route  Sig  Dispense  Refill  . atorvastatin (LIPITOR) 20 MG tablet   Oral   Take 20 mg by mouth daily.           . divalproex (DEPAKOTE) 125 MG DR tablet   Oral   Take 125 mg by mouth daily.           . feeding supplement (GLUCERNA SHAKE) LIQD   Oral   Take 237 mLs by mouth 3 (three) times daily between meals.   30 Can   1   . memantine (NAMENDA) 10 MG tablet   Oral   Take 10 mg by mouth daily.           Marland Kitchen omeprazole (PRILOSEC) 20 MG capsule   Oral   Take 20 mg by mouth daily.           . rivastigmine (EXELON) 9.5 mg/24hr   Transdermal   Place 1  patch onto the skin daily.             BP 146/77  Pulse 94  Temp(Src) 97.9 F (36.6 C)  Resp 16  SpO2 100%  Physical Exam  Nursing note and vitals reviewed. Constitutional:  Chronically ill-appearing, cachectic opens eyes to verbal stimulus  HENT:  Head: Normocephalic and atraumatic.  Mucus membranes dry  Eyes: Conjunctivae are normal. Pupils are equal, round, and reactive to light.  Neck: Neck supple. No tracheal deviation present. No thyromegaly present.  Cardiovascular: Normal rate and regular rhythm.   No murmur heard. Pulmonary/Chest: Effort normal and breath sounds normal.  Abdominal: Soft. Bowel sounds are normal. She exhibits no distension. There is no tenderness.  Genitourinary: Guaiac negative stool.  Rectal normal tone soft brown stool grossly heme-negative  Musculoskeletal: She exhibits no edema and no tenderness.  Flexion contracture of right upper  extremity both lower extremities with muscular atrophy. Left upper extremity with muscular atrophy.  Neurological: No cranial nerve deficit.  Sleepy arousable to verbal stimulus  Skin: Skin is warm and dry. No rash noted.  Superficial decubitus ulcers of right buttock and right hip  Psychiatric: She has a normal mood and affect.    ED Course  Procedures (including critical care time)  Labs Reviewed - No data to display No results found.   No diagnosis found. Results for orders placed during the hospital encounter of 07/12/12  CBC WITH DIFFERENTIAL      Result Value Range   WBC 10.9 (*) 4.0 - 10.5 K/uL   RBC 4.24  3.87 - 5.11 MIL/uL   Hemoglobin 12.3  12.0 - 15.0 g/dL   HCT 16.1  09.6 - 04.5 %   MCV 89.9  78.0 - 100.0 fL   MCH 29.0  26.0 - 34.0 pg   MCHC 32.3  30.0 - 36.0 g/dL   RDW 40.9  81.1 - 91.4 %   Platelets 124 (*) 150 - 400 K/uL   Neutrophils Relative 78 (*) 43 - 77 %   Neutro Abs 8.5 (*) 1.7 - 7.7 K/uL   Lymphocytes Relative 15  12 - 46 %   Lymphs Abs 1.7  0.7 - 4.0 K/uL   Monocytes Relative 7  3 - 12 %   Monocytes Absolute 0.7  0.1 - 1.0 K/uL   Eosinophils Relative 0  0 - 5 %   Eosinophils Absolute 0.0  0.0 - 0.7 K/uL   Basophils Relative 0  0 - 1 %   Basophils Absolute 0.0  0.0 - 0.1 K/uL  COMPREHENSIVE METABOLIC PANEL      Result Value Range   Sodium 150 (*) 135 - 145 mEq/L   Potassium 3.8  3.5 - 5.1 mEq/L   Chloride 112  96 - 112 mEq/L   CO2 28  19 - 32 mEq/L   Glucose, Bld 152 (*) 70 - 99 mg/dL   BUN 20  6 - 23 mg/dL   Creatinine, Ser 7.82  0.50 - 1.10 mg/dL   Calcium 9.0  8.4 - 95.6 mg/dL   Total Protein 6.6  6.0 - 8.3 g/dL   Albumin 2.9 (*) 3.5 - 5.2 g/dL   AST 17  0 - 37 U/L   ALT 14  0 - 35 U/L   Alkaline Phosphatase 108  39 - 117 U/L   Total Bilirubin 0.4  0.3 - 1.2 mg/dL   GFR calc non Af Amer 77 (*) >90 mL/min   GFR calc Af Amer 89 (*) >90 mL/min  URINALYSIS, ROUTINE W REFLEX MICROSCOPIC      Result Value Range   Color, Urine AMBER (*)  YELLOW   APPearance CLOUDY (*) CLEAR   Specific Gravity, Urine 1.026  1.005 - 1.030   pH 5.5  5.0 - 8.0   Glucose, UA NEGATIVE  NEGATIVE mg/dL   Hgb urine dipstick NEGATIVE  NEGATIVE   Bilirubin Urine NEGATIVE  NEGATIVE   Ketones, ur 15 (*) NEGATIVE mg/dL   Protein, ur NEGATIVE  NEGATIVE mg/dL   Urobilinogen, UA 1.0  0.0 - 1.0 mg/dL   Nitrite POSITIVE (*) NEGATIVE   Leukocytes, UA SMALL (*) NEGATIVE  VALPROIC ACID LEVEL      Result Value Range   Valproic Acid Lvl <10.0 (*) 50.0 - 100.0 ug/mL  URINE MICROSCOPIC-ADD ON      Result Value Range   Squamous Epithelial / LPF RARE  RARE   WBC, UA 7-10  <3 WBC/hpf   Bacteria, UA MANY (*) RARE  OCCULT BLOOD, POC DEVICE      Result Value Range   Fecal Occult Bld NEGATIVE  NEGATIVE  POCT I-STAT TROPONIN I      Result Value Range   Troponin i, poc 0.01  0.00 - 0.08 ng/mL   Comment 3           POCT I-STAT 3, BLOOD GAS (G3+)      Result Value Range   pH, Arterial 7.442  7.350 - 7.450   pCO2 arterial 43.7  35.0 - 45.0 mmHg   pO2, Arterial 54.0 (*) 80.0 - 100.0 mmHg   Bicarbonate 29.9 (*) 20.0 - 24.0 mEq/L   TCO2 31  0 - 100 mmol/L   O2 Saturation 89.0     Acid-Base Excess 5.0 (*) 0.0 - 2.0 mmol/L   Patient temperature 98.0 F     Collection site RADIAL, ALLEN'S TEST ACCEPTABLE     Drawn by RT     Sample type ARTERIAL     Date: 07/13/2012  Rate: 90  Rhythm: normal sinus rhythm  QRS Axis: left  Intervals: normal  ST/T Wave abnormalities: nonspecific T wave changes  Conduction Disutrbances:none  Narrative Interpretation:   Old EKG Reviewed: none available CHEST XRAY VIEWED BY ME. Dg Chest 1 View  07/12/2012  *RADIOLOGY REPORT*  Clinical Data: Weakness  CHEST - 1 VIEW  Comparison: 11/06/2008  Findings: Interstitial coarsening, right apical scarring, and emphysematous changes.  Heart size upper normal to mildly enlarged. Aortic tortuosity.  Mild lung base opacities.  No definite pleural effusion. No pneumothorax.  Osteopenia. Remote  right sided rib fractures.  IMPRESSION: COPD.  Mild lung base opacities, atelectasis (favored) versus infiltrate.   Original Report Authenticated By: Jearld Lesch, M.D.    Ct Head Wo Contrast  07/12/2012  *RADIOLOGY REPORT*  Clinical Data: Weakness  CT HEAD WITHOUT CONTRAST  Technique:  Contiguous axial images were obtained from the base of the skull through the vertex without contrast.  Comparison: 05/05/2012  Findings: Motion degraded.  Prominence of the sulci, cisterns, and ventricles, in keeping with volume loss. There are subcortical and periventricular white matter hypodensities, a nonspecific finding most often seen with chronic microangiopathic changes.  There is no evidence for acute hemorrhage, overt hydrocephalus, mass lesion, or abnormal extra-axial fluid collection.  No definite CT evidence for acute cortical based (large artery) infarction. More confluent area of hypoattenuation within the high left parietal lobe may reflect sequelae of remote infarction, similar to prior.  The visualized paranasal sinuses and mastoid air cells are  predominately clear.  IMPRESSION: Motion degrades examination without definitive evidence of acute intracranial process.  Advanced white matter changes, remote high left parietal lobe infarct, and volume loss are similar to prior.  MRI follow-up recommended if concern for acute ischemia persists.   Original Report Authenticated By: Jearld Lesch, M.D.      Respiratory therapist unable to obtain blood gas. Procedure the time out was performed I performed phlebotomy from right femoral artery., Was performed the skin was prepped with alcohol. Successful arterial blood sample was obtained. No resultant hematoma at site MDM  Patient is clinically dehydrated, supported by history, exam and laboratory evidence. Also we'll treat with antibiotics for urinary tract infection given leukocytes positive nitrites and bacteria in urine. I discussed with family members that  patient is likely at end-of-life .   Spoke with Dr. Elisabeth Pigeon plan admit to medical surgical floor Diagnosis #1 dehydration #2 urinary tract infection 3 decubitus ulcers #4 hyperglycemia       Doug Sou, MD 07/13/12 4098  Doug Sou, MD 07/13/12 1191

## 2012-07-12 NOTE — ED Notes (Signed)
 oxygen increased to 3L at this time.

## 2012-07-13 ENCOUNTER — Encounter (HOSPITAL_COMMUNITY): Payer: Self-pay | Admitting: *Deleted

## 2012-07-13 DIAGNOSIS — N390 Urinary tract infection, site not specified: Secondary | ICD-10-CM | POA: Diagnosis present

## 2012-07-13 DIAGNOSIS — I69359 Hemiplegia and hemiparesis following cerebral infarction affecting unspecified side: Secondary | ICD-10-CM

## 2012-07-13 DIAGNOSIS — E860 Dehydration: Secondary | ICD-10-CM | POA: Diagnosis present

## 2012-07-13 DIAGNOSIS — R627 Adult failure to thrive: Secondary | ICD-10-CM | POA: Diagnosis present

## 2012-07-13 DIAGNOSIS — E870 Hyperosmolality and hypernatremia: Secondary | ICD-10-CM | POA: Diagnosis present

## 2012-07-13 LAB — BASIC METABOLIC PANEL
BUN: 18 mg/dL (ref 6–23)
BUN: 17 mg/dL (ref 6–23)
CO2: 27 mEq/L (ref 19–32)
CO2: 26 mEq/L (ref 19–32)
Calcium: 8.5 mg/dL (ref 8.4–10.5)
Creatinine, Ser: 0.68 mg/dL (ref 0.50–1.10)
Glucose, Bld: 140 mg/dL — ABNORMAL HIGH (ref 70–99)
Glucose, Bld: 176 mg/dL — ABNORMAL HIGH (ref 70–99)
Potassium: 3.4 mEq/L — ABNORMAL LOW (ref 3.5–5.1)
Sodium: 155 mEq/L — ABNORMAL HIGH (ref 135–145)

## 2012-07-13 LAB — CBC
HCT: 33.4 % — ABNORMAL LOW (ref 36.0–46.0)
Hemoglobin: 10.6 g/dL — ABNORMAL LOW (ref 12.0–15.0)
MCH: 28.2 pg (ref 26.0–34.0)
MCHC: 31.7 g/dL (ref 30.0–36.0)
MCV: 88.8 fL (ref 78.0–100.0)
RBC: 3.76 MIL/uL — ABNORMAL LOW (ref 3.87–5.11)

## 2012-07-13 MED ORDER — DIVALPROEX SODIUM 125 MG PO CPSP
125.0000 mg | ORAL_CAPSULE | ORAL | Status: DC
  Administered 2012-07-13 – 2012-07-15 (×4): 125 mg via ORAL
  Filled 2012-07-13 (×3): qty 1

## 2012-07-13 MED ORDER — DEXTROSE 5 % IV SOLN: INTRAVENOUS | Status: DC

## 2012-07-13 MED ORDER — SODIUM CHLORIDE 0.9 % IV SOLN: INTRAVENOUS | Status: DC

## 2012-07-13 MED ORDER — DIVALPROEX SODIUM 125 MG PO DR TAB
125.0000 mg | DELAYED_RELEASE_TABLET | ORAL | Status: DC
  Filled 2012-07-13: qty 1

## 2012-07-13 MED ORDER — INSULIN ASPART 100 UNIT/ML ~~LOC~~ SOLN
0.0000 [IU] | Freq: Three times a day (TID) | SUBCUTANEOUS | Status: DC
  Administered 2012-07-14 (×2): 1 [IU] via SUBCUTANEOUS
  Administered 2012-07-14 – 2012-07-15 (×2): 2 [IU] via SUBCUTANEOUS

## 2012-07-13 MED ORDER — GLUCERNA SHAKE PO LIQD
237.0000 mL | Freq: Two times a day (BID) | ORAL | Status: DC
  Administered 2012-07-13 – 2012-07-15 (×6): 237 mL via ORAL

## 2012-07-13 MED ORDER — DEXTROSE 5 % IV SOLN
INTRAVENOUS | Status: DC
  Administered 2012-07-13: 1000 mL via INTRAVENOUS
  Administered 2012-07-13: 500 mL via INTRAVENOUS

## 2012-07-13 MED ORDER — MEMANTINE HCL 10 MG PO TABS
10.0000 mg | ORAL_TABLET | ORAL | Status: DC
  Administered 2012-07-13 – 2012-07-15 (×3): 10 mg via ORAL
  Filled 2012-07-13 (×4): qty 1

## 2012-07-13 MED ORDER — ENOXAPARIN SODIUM 40 MG/0.4ML ~~LOC~~ SOLN
40.0000 mg | Freq: Every day | SUBCUTANEOUS | Status: DC
  Administered 2012-07-13 – 2012-07-14 (×2): 40 mg via SUBCUTANEOUS
  Filled 2012-07-13 (×3): qty 0.4

## 2012-07-13 MED ORDER — CEFTRIAXONE SODIUM 1 G IJ SOLR
1.0000 g | Freq: Every day | INTRAMUSCULAR | Status: DC
  Administered 2012-07-13 – 2012-07-14 (×3): 1 g via INTRAVENOUS
  Filled 2012-07-13 (×4): qty 10

## 2012-07-13 MED ORDER — ENSURE PUDDING PO PUDG
1.0000 | ORAL | Status: DC
  Administered 2012-07-13 – 2012-07-15 (×3): 1 via ORAL

## 2012-07-13 MED ORDER — SODIUM CHLORIDE 0.9 % IV SOLN
INTRAVENOUS | Status: DC
Start: 2012-07-13 — End: 2012-07-13
  Administered 2012-07-13: 03:00:00 via INTRAVENOUS

## 2012-07-13 MED ORDER — RIVASTIGMINE 9.5 MG/24HR TD PT24
9.5000 mg | MEDICATED_PATCH | TRANSDERMAL | Status: DC
  Administered 2012-07-13 – 2012-07-15 (×3): 9.5 mg via TRANSDERMAL
  Filled 2012-07-13 (×3): qty 1

## 2012-07-13 MED ORDER — ATORVASTATIN CALCIUM 20 MG PO TABS
20.0000 mg | ORAL_TABLET | ORAL | Status: DC
  Administered 2012-07-13 – 2012-07-15 (×3): 20 mg via ORAL
  Filled 2012-07-13 (×4): qty 1

## 2012-07-13 NOTE — Clinical Social Work Note (Signed)
CSW intern talked with family and they are agreeable to SNF placement. Clinical information sent to facilities in [facility]. Assessment note to follow.  Genelle Bal, MSW, LCSW [phone]

## 2012-07-13 NOTE — Progress Notes (Signed)
CORETTA LEISEY [id number] Admitted to 5500: 07/13/2012 3:56 AM Attending Provider: Tarry Kos, MD    NAJEE COWENS is a 79 y.o. female patient admitted from ED awake, alert to self only,  Full Code, VSS - Blood pressure 146/74, pulse 90, temperature 98.2 F (36.8 C), temperature source Oral, resp. rate 12, SpO2 100.00%., O2 3 L nasal cannular, no c/o shortness of breath, no c/o chest pain, no distress noted.    IV site WDL:  with a transparent dsg that's clean dry and intact.  Allergies:  No Known Allergies   Past Medical History  Diagnosis Date  . Stroke   . Dementia   . Hypertension   . Laceration of skin of eyelid and periocular area   . Other fall   . Wheelchair dependence   . Hypopotassemia   . Encounters for unspecified administrative purpose   . Other and unspecified hyperlipidemia   . Reflux esophagitis   . Blood in stool   . Routine general medical examination at a health care facility   . Hypopotassemia   . Dementia in conditions classified elsewhere with behavioral disturbance(294.11)   . Dementia in conditions classified elsewhere without behavioral disturbance(294.10)   . Loss of weight   . Cough   . Unspecified vitamin D deficiency   . Thrombocytopenia, unspecified   . Unspecified disorder of kidney and ureter   . Other and unspecified hyperlipidemia   . Senile dementia, uncomplicated   . Anxiety state, unspecified   . Unspecified essential hypertension   . Unspecified late effects of cerebrovascular disease   . Other malaise and fatigue   . Abnormality of gait     History:  obtained from family  Pt orientation to unit, room and routine. Information packet given to family and safety video refused.  Admission INP armband ID verified with family, and in place. SR up x 2, fall risk assessment complete with family verbalizing understanding of risks associated with falls. Family verbalizes an understanding of how to use the call bell and to call for help before  patient tries getting out of bed.  Skin see assessment  Will cont to monitor and assist as needed.  Elisha Ponder, RN 07/13/2012 3:56 AM

## 2012-07-13 NOTE — Progress Notes (Signed)
INITIAL NUTRITION ASSESSMENT  DOCUMENTATION CODES Per approved criteria  -Not Applicable   INTERVENTION: 1. Continue Ensure Complete po BID, each supplement provides 350 kcal and 13 grams of protein. 2. Add Ensure Pudding po daily, each supplement provides 170 kcal and 4 grams of protein.  3. Family has requested soft diet, RD will change diet to Dysphagia 3.     NUTRITION DIAGNOSIS: Swallowing difficulty related to dementia as evidenced by family report of needing soft foods.   Goal: PO intake to meet >/=90% estimated nutrition needs  Monitor:  PO intake, weight trends, labs, I/O's  Reason for Assessment: Consult-poor po intake   79 y.o. female  Admitting Dx: Hypernatremia  ASSESSMENT: Pt from home with daughter, with hx of dementia and CVA, bed bound, non-verbal. Pt admitted with poor po intake for weeks to months, FTT, hypernatremia.     Spoke with family in room. Pt was eating ok, but was not drinking much fluids. They are questioning her swallowing. Has been drinking 2 Ensure daily. Will add Ensure shake and Ensure pudding.  Weight has been stable.  Has an order for SLP evaluation.   Height: Ht Readings from Last 1 Encounters:  07/13/12 5' (1.524 m)    Weight: Wt Readings from Last 1 Encounters:  07/13/12 120 lb (54.432 kg)    Ideal Body Weight: 100 lbs   % Ideal Body Weight: 120%  Wt Readings from Last 10 Encounters:  07/13/12 120 lb (54.432 kg)  05/05/12 120 lb 12.8 oz (54.795 kg)    Usual Body Weight: 120 lbs   % Usual Body Weight: 100%  BMI:  Body mass index is 23.44 kg/(m^2). WNL  Estimated Nutritional Needs: Kcal: 1400-1600 Protein: 60-70 gm  Fluid: >/= 1.7 L   Skin: Stage II on arm, 2 stage II's on buttocks  Diet Order: General  EDUCATION NEEDS: -No education needs identified at this time   Intake/Output Summary (Last 24 hours) at 07/13/12 0831 Last data filed at 07/13/12 0600  Gross per 24 hour  Intake 1277.5 ml  Output      0  ml  Net 1277.5 ml    Last BM: PTA    Labs:   Recent Labs Lab 07/08/12 1757 07/12/12 2137 07/13/12 0605  NA 152* 150* 155*  K 3.8 3.8 3.4*  CL 110* 112 119*  CO2 26 28 27   BUN 20 20 18   CREATININE 0.76 0.76 0.70  CALCIUM 9.4 9.0 8.5  GLUCOSE 108* 152* 140*    CBG (last 3)  No results found for this basename: GLUCAP,  in the last 72 hours  Scheduled Meds: . sodium chloride   Intravenous STAT  . atorvastatin  20 mg Oral Custom  . cefTRIAXone (ROCEPHIN)  IV  1 g Intravenous QHS  . divalproex  125 mg Oral Custom  . enoxaparin (LOVENOX) injection  40 mg Subcutaneous Daily  . feeding supplement  237 mL Oral BID BM  . memantine  10 mg Oral Custom  . rivastigmine  9.5 mg Transdermal Custom    Continuous Infusions: . dextrose 1,000 mL (07/13/12 1610)    Past Medical History  Diagnosis Date  . Stroke   . Dementia   . Hypertension   . Laceration of skin of eyelid and periocular area   . Other fall   . Wheelchair dependence   . Hypopotassemia   . Encounters for unspecified administrative purpose   . Other and unspecified hyperlipidemia   . Reflux esophagitis   . Blood in stool   .  Routine general medical examination at a health care facility   . Hypopotassemia   . Dementia in conditions classified elsewhere with behavioral disturbance(294.11)   . Dementia in conditions classified elsewhere without behavioral disturbance(294.10)   . Loss of weight   . Cough   . Unspecified vitamin D deficiency   . Thrombocytopenia, unspecified   . Unspecified disorder of kidney and ureter   . Other and unspecified hyperlipidemia   . Senile dementia, uncomplicated   . Anxiety state, unspecified   . Unspecified essential hypertension   . Unspecified late effects of cerebrovascular disease   . Other malaise and fatigue   . Abnormality of gait     Past Surgical History  Procedure Laterality Date  . Joint replacement  2008    Dr Renae Fickle  . Abdominal hysterectomy  1981     Clarene Duke RD, LDN Pager [phone] After Hours pager [phone]

## 2012-07-13 NOTE — Progress Notes (Signed)
Addendum  Patient seen and examined, chart and data base reviewed.  I agree with the above assessment and plan.  For full details please see Mrs. Algis Downs PA note.   Clint Lipps, MD [facility] Pager: [phone] 07/13/2012, 1:41 PM

## 2012-07-13 NOTE — Evaluation (Signed)
Physical Therapy Evaluation Patient Details Name: Tracie [name] MRN: [id number] DOB: 1929-02-10 Today's Date: 07/13/2012 Time: 4742-5956 PT Time Calculation (min): 18 min  PT Assessment / Plan / Recommendation Clinical Impression  Pt adm with UTI, dehydration, and hypernatremia.  Pt also with advanced dementia.  Will do a trial of PT but unsure if pt will demonstrate any progress.    PT Assessment  Patient needs continued PT services    Follow Up Recommendations  SNF (Expect pt will need long term care.)    Does the patient have the potential to tolerate intense rehabilitation      Barriers to Discharge        Equipment Recommendations       Recommendations for Other Services     Frequency Min 2X/week    Precautions / Restrictions Precautions Precautions: Fall   Pertinent Vitals/Pain No signs of pain.      Mobility  Bed Mobility Bed Mobility: Rolling Right;Rolling Left;Supine to Sit;Sitting - Scoot to [place] of Bed;Sit to Supine;Scooting to [facility] Rolling Right: 1: +2 Total assist Rolling Right: Patient Percentage: 0% Rolling Left: 1: +2 Total assist Rolling Left: Patient Percentage: 0% Supine to Sit: 1: +2 Total assist Supine to Sit: Patient Percentage: 0% Sitting - Scoot to Edge of Bed: 1: +2 Total assist Sitting - Scoot to Edge of Bed: Patient Percentage: 0% Sit to Supine: 1: +2 Total assist Sit to Supine: Patient Percentage: 0% Scooting to HOB: 1: +2 Total assist Scooting to [facility]: Patient Percentage: 0% Transfers Transfers: Sit to Stand;Stand to Sit Sit to Stand: 1: +2 Total assist Sit to Stand: Patient Percentage: 10% Stand to Sit: 1: +2 Total assist Stand to Sit: Patient Percentage: 10% Details for Transfer Assistance: Able to get pt to ~3/4 standing by using bed pad under hips to lift.    Exercises     PT Diagnosis: Generalized weakness;Altered mental status  PT Problem List: Decreased strength;Decreased balance;Decreased mobility PT Treatment  Interventions: Functional mobility training;Therapeutic activities;Balance training;Patient/family education   PT Goals Acute Rehab PT Goals Time For Goal Achievement: 07/27/12 Potential to Achieve Goals: Fair Pt will go Supine/Side to Sit: with mod assist PT Goal: Supine/Side to Sit - Progress: Goal set today Pt will go Sit to Supine/Side: with mod assist PT Goal: Sit to Supine/Side - Progress: Goal set today Pt will go Sit to Stand: with mod assist PT Goal: Sit to Stand - Progress: Goal set today Pt will go Stand to Sit: with mod assist PT Goal: Stand to Sit - Progress: Goal set today Pt will Transfer Bed to Chair/Chair to Bed: with mod assist PT Transfer Goal: Bed to Chair/Chair to Bed - Progress: Goal set today  Visit Information  Last PT Received On: 07/13/12 Assistance Needed: +2    Subjective Data  Subjective: Pt smiling and some gibberish. Patient Stated Goal: Pt unable to state.   Prior Functioning  Home Living Lives With: Spouse;Daughter Available Help at Discharge: Family;Available 24 hours/day;Other (Comment) (adult day care) Home Adaptive Equipment: Wheelchair - manual Prior Function Level of Independence: Needs assistance Needs Assistance: Bathing;Dressing;Toileting;Transfers Bath: Total Dressing: Total Toileting: Total Meal Prep: Total Transfer Assistance: Total A to pivot to chair Able to Take Stairs?: No Vocation: Retired Comments: Goes to adult day care Communication Communication: Other (comment) (dementia)    Cognition  Cognition Arousal/Alertness: Awake/alert Overall Cognitive Status: History of cognitive impairments - at baseline General Comments: Pt only able to follow a few 1 step commands with left arm.  Extremity/Trunk Assessment Right Lower Extremity Assessment RLE ROM/Strength/Tone: Unable to fully assess;Due to impaired cognition RLE ROM/Strength/Tone Deficits: No movement against gravity noted. Left Lower Extremity Assessment LLE  ROM/Strength/Tone: Unable to fully assess;Due to impaired cognition LLE ROM/Strength/Tone Deficits: Able to move against gravity   Balance Balance Balance Assessed: Yes Static Sitting Balance Static Sitting - Balance Support: Left upper extremity supported Static Sitting - Level of Assistance: 4: Min assist;5: Stand by assistance Static Sitting - Comment/# of Minutes: sat EOB x 5-7 minutes with initial min A and then supervision.  End of Session PT - End of Session Equipment Utilized During Treatment: Gait belt Activity Tolerance: Other (comment) (limited by cognition) Patient left: in bed;with call bell/phone within reach;with bed alarm set  GP     [facility] 07/13/2012, 4:26 PM  [facility] PT [phone]

## 2012-07-13 NOTE — H&P (Signed)
Chief Complaint:  Not eating  HPI: 79 yo female with adv dementia and old cva with left sided hemiparesis lives at home with dtr at baseline bedbound, nonverbal comes in with weeks to months of not eating well and significant cognitive decline.  No n/v/d.  No fevers at home.  Sleeps a lot.  Developing mult decub wounds.  Is dehydrated and has uti.  Review of Systems:  inobtainable from pt  Past Medical History: Past Medical History  Diagnosis Date  . Stroke   . Dementia   . Hypertension   . Diabetes mellitus   . Laceration of skin of eyelid and periocular area   . Other fall   . Wheelchair dependence   . Hypopotassemia   . Encounters for unspecified administrative purpose   . Other and unspecified hyperlipidemia   . Reflux esophagitis   . Blood in stool   . Routine general medical examination at a health care facility   . Secondary diabetes mellitus with unspecified complication, uncontrolled   . Chronic kidney disease, stage III (moderate)   . Hypopotassemia   . Dementia in conditions classified elsewhere with behavioral disturbance(294.11)   . Dementia in conditions classified elsewhere without behavioral disturbance(294.10)   . Loss of weight   . Cough   . Unspecified vitamin D deficiency   . Thrombocytopenia, unspecified   . Unspecified disorder of kidney and ureter   . Other and unspecified hyperlipidemia   . Senile dementia, uncomplicated   . Anxiety state, unspecified   . Unspecified essential hypertension   . Unspecified late effects of cerebrovascular disease   . Other malaise and fatigue   . Abnormality of gait    Past Surgical History  Procedure Laterality Date  . Joint replacement  2008    Dr Renae Fickle  . Abdominal hysterectomy  1981    Medications: Prior to Admission medications   Medication Sig Start Date End Date Taking? Authorizing Provider  atorvastatin (LIPITOR) 20 MG tablet Take 20 mg by mouth See admin instructions. Takes M-T-W-T-F   Yes  Historical Provider, MD  divalproex (DEPAKOTE) 125 MG DR tablet Take 125 mg by mouth See admin instructions. Takes M-T-W-T-F   Yes Historical Provider, MD  feeding supplement (GLUCERNA SHAKE) LIQD Take 237 mLs by mouth 2 (two) times daily between meals. 05/06/12  Yes Glori Luis, MD  memantine (NAMENDA) 10 MG tablet Take 10 mg by mouth See admin instructions. Takes M-T-W-T-F   Yes Historical Provider, MD  omeprazole (PRILOSEC) 20 MG capsule Take 20 mg by mouth See admin instructions. Takes M-T-W-T-F   Yes Historical Provider, MD  rivastigmine (EXELON) 9.5 mg/24hr Place 1 patch onto the skin See admin instructions. Takes M-T-W-T-F   Yes Historical Provider, MD    Allergies:  No Known Allergies  Social History:  reports that she has never smoked. She has never used smokeless tobacco. She reports that she does not drink alcohol or use illicit drugs.  Family History: History reviewed. No pertinent family history.  Physical Exam: Filed Vitals:   07/12/12 2130 07/12/12 2145 07/12/12 2251 07/12/12 2345  BP: 153/86 142/86 158/74 140/71  Pulse: 64  81   Temp:      Resp: 29 18 20 26   SpO2: 90%  99% 95%   General appearance: cachectic and no distress Head: Normocephalic, without obvious abnormality, atraumatic Eyes: negative Nose: Nares normal. Septum midline. Mucosa normal. No drainage or sinus tenderness. Neck: no JVD and supple, symmetrical, trachea midline Lungs: clear to auscultation bilaterally Heart:  regular rate and rhythm, S1, S2 normal, no murmur, click, rub or gallop Abdomen: soft, non-tender; bowel sounds normal; no masses,  no organomegaly Extremities: extremities normal, atraumatic, no cyanosis or edema Pulses: 2+ and symmetric Skin: Skin color, texture, turgor normal. No rashes or lesions or mult decub wounds Neurologic: left hemipareisis    Labs on Admission:   Recent Labs  07/12/12 2137  NA 150*  K 3.8  CL 112  CO2 28  GLUCOSE 152*  BUN 20  CREATININE  0.76  CALCIUM 9.0    Recent Labs  07/12/12 2137  AST 17  ALT 14  ALKPHOS 108  BILITOT 0.4  PROT 6.6  ALBUMIN 2.9*    Recent Labs  07/12/12 2137  WBC 10.9*  NEUTROABS 8.5*  HGB 12.3  HCT 38.1  MCV 89.9  PLT 124*    Radiological Exams on Admission: Dg Chest 1 View  07/12/2012  *RADIOLOGY REPORT*  Clinical Data: Weakness  CHEST - 1 VIEW  Comparison: 11/06/2008  Findings: Interstitial coarsening, right apical scarring, and emphysematous changes.  Heart size upper normal to mildly enlarged. Aortic tortuosity.  Mild lung base opacities.  No definite pleural effusion. No pneumothorax.  Osteopenia. Remote right sided rib fractures.  IMPRESSION: COPD.  Mild lung base opacities, atelectasis (favored) versus infiltrate.   Original Report Authenticated By: Jearld Lesch, M.D.    Ct Head Wo Contrast  07/12/2012  *RADIOLOGY REPORT*  Clinical Data: Weakness  CT HEAD WITHOUT CONTRAST  Technique:  Contiguous axial images were obtained from the base of the skull through the vertex without contrast.  Comparison: 05/05/2012  Findings: Motion degraded.  Prominence of the sulci, cisterns, and ventricles, in keeping with volume loss. There are subcortical and periventricular white matter hypodensities, a nonspecific finding most often seen with chronic microangiopathic changes.  There is no evidence for acute hemorrhage, overt hydrocephalus, mass lesion, or abnormal extra-axial fluid collection.  No definite CT evidence for acute cortical based (large artery) infarction. More confluent area of hypoattenuation within the high left parietal lobe may reflect sequelae of remote infarction, similar to prior.  The visualized paranasal sinuses and mastoid air cells are predominately clear.  IMPRESSION: Motion degrades examination without definitive evidence of acute intracranial process.  Advanced white matter changes, remote high left parietal lobe infarct, and volume loss are similar to prior.  MRI follow-up  recommended if concern for acute ischemia persists.   Original Report Authenticated By: Jearld Lesch, M.D.     Assessment/Plan 79 yo female with FTT, adv dementia, h/o cva, dehydration, hypernatremia and uti  Principal Problem:   Hypernatremia Active Problems:   Dementia   CVA, old, hemiparesis   FTT (failure to thrive) in adult   UTI (lower urinary tract infection)   Dehydration  Place on rocephin.  ivf overnight.  Full code at this time.  dtr present and would like her father present for this discussion and he has already gone home.  Speech consult.  Full code.  Med bed.  Amedio Bowlby A 07/13/2012, 12:58 AM

## 2012-07-13 NOTE — Evaluation (Signed)
Clinical/Bedside Swallow Evaluation Patient Details  Name: JUDYTHE POSTEMA MRN: [id number] Date of Birth: Oct 12, 1928  Today's Date: 07/13/2012 Time: 1030-1100 SLP Time Calculation (min): 30 min  Past Medical History:  Past Medical History  Diagnosis Date  . Stroke   . Dementia   . Hypertension   . Laceration of skin of eyelid and periocular area   . Other fall   . Wheelchair dependence   . Hypopotassemia   . Encounters for unspecified administrative purpose   . Other and unspecified hyperlipidemia   . Reflux esophagitis   . Blood in stool   . Routine general medical examination at a health care facility   . Hypopotassemia   . Dementia in conditions classified elsewhere with behavioral disturbance(294.11)   . Dementia in conditions classified elsewhere without behavioral disturbance(294.10)   . Loss of weight   . Cough   . Unspecified vitamin D deficiency   . Thrombocytopenia, unspecified   . Unspecified disorder of kidney and ureter   . Other and unspecified hyperlipidemia   . Senile dementia, uncomplicated   . Anxiety state, unspecified   . Unspecified essential hypertension   . Unspecified late effects of cerebrovascular disease   . Other malaise and fatigue   . Abnormality of gait    Past Surgical History:  Past Surgical History  Procedure Laterality Date  . Joint replacement  2008    Dr Renae Fickle  . Abdominal hysterectomy  1981   HPI:  Zuleima Haser is an 79 yo female with advanced dementia and old cva with left sided hemiparesis, lives at home with daughter at baseline bedbound and nonverbal. She came to [facility] 4/21 with weeks to months of not eating well and significant cognitive decline. Sleeps a lot. Developing mult decub wounds. Is dehydrated and has uti.   Assessment / Plan / Recommendation Clinical Impression  Patient presents with a functional swallow with no overt s/s of aspiration observed at bedside. Pt with advanced dementia and currently on dys-3/dys-2 diet with  thin liquids at home with full assist with feedings. While pt presents with a functional and safe swallow with good airway protection, prolonged mastication was observed, however family reports pt with prolonged mastication/bolus holding at baseline. SLP provided education to family on aspiration precautions, diet recommendations, and strategies to assist patient with oral recognition/manipulation.   Per family, prefer a dys-2 diet with thin liquids (easier for pt to consume), however SLP does not restrict an advanced diet of dys-3. No f/u needed while in acute setting.     Aspiration Risk  Mild    Diet Recommendation Dysphagia 3 (Mechanical Soft);Dysphagia 2 (Fine chop);Thin liquid   Liquid Administration via: Straw Medication Administration: Crushed with puree Supervision: Staff feed patient;Trained caregiver to feed patient (Family members currently feeding patient) Compensations: Slow rate;Small sips/bites Postural Changes and/or Swallow Maneuvers: Seated upright 90 degrees    Other  Recommendations Oral Care Recommendations: Oral care BID   Follow Up Recommendations  None    Frequency and Duration        Pertinent Vitals/Pain None reported    SLP Swallow Goals     Swallow Study Prior Functional Status       General Date of Onset: 07/12/12 HPI: Gracy Ehly is an 79 yo female with advanced dementia and old cva with left sided hemiparesis, lives at home with daughter at baseline bedbound and nonverbal. She came to [facility] 4/21 with weeks to months of not eating well and significant cognitive decline. Sleeps a lot. Developing  mult decub wounds. Is dehydrated and has uti. Type of Study: Bedside swallow evaluation Previous Swallow Assessment: none found Diet Prior to this Study: Dysphagia 3 (soft);Thin liquids Temperature Spikes Noted: No Respiratory Status: Room air History of Recent Intubation: No Behavior/Cognition: Cooperative;Pleasant mood;Alert;Requires cueing Oral Cavity -  Dentition: Adequate natural dentition (partials on top) Self-Feeding Abilities: Total assist Patient Positioning: Upright in bed Baseline Vocal Quality: Clear Volitional Cough: Cognitively unable to elicit Volitional Swallow: Unable to elicit    Oral/Motor/Sensory Function Overall Oral Motor/Sensory Function: Other (comment) (Pt cognitively unable to fully participate in oral mech) Labial Symmetry: Within Functional Limits   Ice Chips Ice chips: Within functional limits Presentation: Spoon   Thin Liquid Thin Liquid: Within functional limits Presentation: Straw    Nectar Thick Nectar Thick Liquid: Not tested   Honey Thick Honey Thick Liquid: Not tested   Puree Puree: Not tested   Solid   GO   Berdine Dance SLP student Solid: Within functional limits Presentation: Barbera Setters 07/13/2012,12:34 PM

## 2012-07-13 NOTE — Evaluation (Signed)
Agree with student findings. Garielle Mroz L. Samson Frederic, [place] CCC/SLP Pager [phone]

## 2012-07-13 NOTE — Progress Notes (Signed)
[facility] PROGRESS NOTE  Tracie [name] ZOX:[id number] DOB: 21-Jul-1928 DOA: 07/12/2012 PCP: Default, Provider, MD  Assessment/Plan:  Hypernatremia Likely due to poor oral intake Na increased from 150 - 155 overnight Started on D5 water, recheck BMP at 1400. Discussed with family that this may recur, and if it does it may be the beginning of her body naturally shutting down.   Due to her poor oral intake (recommended against PEG).    Dehydration  Due to poor oral intake Appreciate nutrition consultation  UTI Await cultures Started on Rocephin  Sacral Decubitii (Stage 2) Wound Care provided by floor nurses.  Dementia with history of CVA Stable.  Mental status at baseline per family On namenda and Exelon    DVT Prophylaxis:  Lovenox  Code Status: Full code, confirmed with Son and Daughter Family Communication: Spoke with Son and Daughter [name] and McKay). Disposition Plan: to SNF physical therapy eval pending.   Consultants:  nutrition  Procedures:    Antibiotics:  Rocephin  HPI/Subjective: Unable to verbalize complaints  Objective: Filed Vitals:   07/13/12 0115 07/13/12 0159 07/13/12 0206 07/13/12 0500  BP: 138/66 146/74  138/77  Pulse: 70 90  75  Temp:  98.2 F (36.8 C)  97.5 F (36.4 C)  TempSrc:  Oral  Axillary  Resp: 12 12  16   Height:  5' (1.524 m)    Weight:  54.432 kg (120 lb)    SpO2: 100%  100% 100%    Intake/Output Summary (Last 24 hours) at 07/13/12 1311 Last data filed at 07/13/12 0900  Gross per 24 hour  Intake 1517.5 ml  Output      0 ml  Net 1517.5 ml   Filed Weights   07/13/12 0159  Weight: 54.432 kg (120 lb)    Exam:   General:  Awake, pleasant, NAD, Lying comfortably in bed, speaks very little  Cardiovascular: RRR, no murmurs, rubs or gallops, no lower extremity edema  Respiratory: CTA, no wheeze, crackles, or rales.  No increased work of breathing.  Abdomen: Soft, non-tender, non-distended, + bowel  sounds, no masses  Musculoskeletal: Able to move all 4 extremities, 5/5 strength in each  Data Reviewed: Basic Metabolic Panel:  Recent Labs Lab 07/08/12 1757 07/12/12 2137 07/13/12 0605  NA 152* 150* 155*  K 3.8 3.8 3.4*  CL 110* 112 119*  CO2 26 28 27   GLUCOSE 108* 152* 140*  BUN 20 20 18   CREATININE 0.76 0.76 0.70  CALCIUM 9.4 9.0 8.5   Liver Function Tests:  Recent Labs Lab 07/08/12 1757 07/12/12 2137  AST 19 17  ALT 13 14  ALKPHOS 130* 108  BILITOT 0.5 0.4  PROT 6.4 6.6  ALBUMIN  --  2.9*   CBC:  Recent Labs Lab 07/08/12 1757 07/12/12 2137 07/13/12 0605  WBC 8.5 10.9* 9.1  NEUTROABS 5.8 8.5*  --   HGB 13.0 12.3 10.6*  HCT 41.3 38.1 33.4*  MCV 89 89.9 88.8  PLT  --  124* 129*     Recent Results (from the past 240 hour(s))  URINE CULTURE     Status: Abnormal   Collection Time    07/08/12  5:57 PM      Result Value Range Status   Urine Culture, Routine Final report (*)  Final   Result 1 Comment (*)  Final   Comment: Escherichia coli, identified by an automated biochemical system.     50,000-100,000 colony forming units per mL   ANTIMICROBIAL SUSCEPTIBILITY Comment   Final  Comment:     S = Susceptible; I = Intermediate; R = Resistant                         P = Positive; N = Negative                 MICS are expressed in micrograms per mL        Antibiotic                 RSLT#1    RSLT#2    RSLT#3    RSLT#4     Amoxicillin/Clavulanic Acid    S     Ampicillin                     S     Cefepime                       S     Ceftriaxone                    S     Cefuroxime                     S     Cephalothin                    S     Ciprofloxacin                  S     Ertapenem                      S     Gentamicin                     S     Imipenem                       S     Levofloxacin                   S     Nitrofurantoin                 S     Piperacillin                   S     Tetracycline                   S     Tobramycin                      S     Trimethoprim/Sulfa             S     Studies: Dg Chest 1 View  07/12/2012  *RADIOLOGY REPORT*  Clinical Data: Weakness  CHEST - 1 VIEW  Comparison: 11/06/2008  Findings: Interstitial coarsening, right apical scarring, and emphysematous changes.  Heart size upper normal to mildly enlarged. Aortic tortuosity.  Mild lung base opacities.  No definite pleural effusion. No pneumothorax.  Osteopenia. Remote right sided rib fractures.  IMPRESSION: COPD.  Mild lung base opacities, atelectasis (favored) versus infiltrate.   Original Report Authenticated By: Jearld Lesch, M.D.    Ct Head Wo Contrast  07/12/2012  *RADIOLOGY REPORT*  Clinical Data: Weakness  CT HEAD WITHOUT CONTRAST  Technique:  Contiguous axial  images were obtained from the base of the skull through the vertex without contrast.  Comparison: 05/05/2012  Findings: Motion degraded.  Prominence of the sulci, cisterns, and ventricles, in keeping with volume loss. There are subcortical and periventricular white matter hypodensities, a nonspecific finding most often seen with chronic microangiopathic changes.  There is no evidence for acute hemorrhage, overt hydrocephalus, mass lesion, or abnormal extra-axial fluid collection.  No definite CT evidence for acute cortical based (large artery) infarction. More confluent area of hypoattenuation within the high left parietal lobe may reflect sequelae of remote infarction, similar to prior.  The visualized paranasal sinuses and mastoid air cells are predominately clear.  IMPRESSION: Motion degrades examination without definitive evidence of acute intracranial process.  Advanced white matter changes, remote high left parietal lobe infarct, and volume loss are similar to prior.  MRI follow-up recommended if concern for acute ischemia persists.   Original Report Authenticated By: Jearld Lesch, M.D.     Scheduled Meds: . sodium chloride   Intravenous STAT  . atorvastatin  20 mg Oral  Custom  . cefTRIAXone (ROCEPHIN)  IV  1 g Intravenous QHS  . divalproex  125 mg Oral Custom  . enoxaparin (LOVENOX) injection  40 mg Subcutaneous Daily  . feeding supplement  1 Container Oral Q24H  . feeding supplement  237 mL Oral BID BM  . memantine  10 mg Oral Custom  . rivastigmine  9.5 mg Transdermal Custom   Continuous Infusions: . dextrose 1,000 mL (07/13/12 0814)    Principal Problem:   Hypernatremia Active Problems:   Dementia   CVA, old, hemiparesis   FTT (failure to thrive) in adult   UTI (lower urinary tract infection)   Dehydration    Conley Canal  [facility] Pager [phone]. If 7PM-7AM, please contact night-coverage at [email or web address], password [facility] 07/13/2012, 1:11 PM  LOS: 1 day

## 2012-07-14 LAB — GLUCOSE, CAPILLARY
Glucose-Capillary: 157 mg/dL — ABNORMAL HIGH (ref 70–99)
Glucose-Capillary: 178 mg/dL — ABNORMAL HIGH (ref 70–99)

## 2012-07-14 LAB — BASIC METABOLIC PANEL
BUN: 13 mg/dL (ref 6–23)
Calcium: 8.5 mg/dL (ref 8.4–10.5)
Creatinine, Ser: 0.62 mg/dL (ref 0.50–1.10)
GFR calc non Af Amer: 82 mL/min — ABNORMAL LOW (ref >90)
Glucose, Bld: 153 mg/dL — ABNORMAL HIGH (ref 70–99)
Potassium: 3.7 mEq/L (ref 3.5–5.1)

## 2012-07-14 LAB — HEMOGLOBIN A1C: Hgb A1c MFr Bld: 6.5 % — ABNORMAL HIGH (ref <5.7)

## 2012-07-14 LAB — URINE CULTURE

## 2012-07-14 LAB — CBC
Hemoglobin: 10.6 g/dL — ABNORMAL LOW (ref 12.0–15.0)
MCH: 28.5 pg (ref 26.0–34.0)
MCHC: 32.9 g/dL (ref 30.0–36.0)
RDW: 12.9 % (ref 11.5–15.5)

## 2012-07-14 MED ORDER — ENSURE PUDDING PO PUDG: 1.0000 | ORAL | Status: DC

## 2012-07-14 MED ORDER — PNEUMOCOCCAL VAC POLYVALENT 25 MCG/0.5ML IJ INJ
0.5000 mL | INJECTION | INTRAMUSCULAR | Status: AC
  Administered 2012-07-15: 0.5 mL via INTRAMUSCULAR
  Filled 2012-07-14: qty 0.5

## 2012-07-14 MED ORDER — SULFAMETHOXAZOLE-TRIMETHOPRIM 800-160 MG PO TABS: 1.0000 | ORAL_TABLET | Freq: Two times a day (BID) | ORAL | Status: DC

## 2012-07-14 NOTE — Evaluation (Signed)
Occupational Therapy Evaluation and Discharge Patient Details Name: Tracie [name] MRN: [id number] DOB: 28-May-1928 Today's Date: 07/14/2012 Time: 7829-5621 OT Time Calculation (min): 10 min  OT Assessment / Plan / Recommendation Clinical Impression  This 79 yo female admitted with weeks to months of not eating well and significant cognitive decline, developing mult decub wounds, and found to have UTI presents to acute OT at baseline from an ADL standpoint. No further OT needs, will D/C from acute OT.    OT Assessment  Patient does not need any further OT services    Follow Up Recommendations  No OT follow up       Equipment Recommendations  None recommended by OT          Precautions / Restrictions Precautions Precautions: Fall Restrictions Weight Bearing Restrictions: No       ADL  Transfers/Ambulation Related to ADLs: Pt is total A for transfers, unable to ambulate (even PTA) ADL Comments: Pt is total A for all BADLs as she was PTA        Visit Information  Last OT Received On: 07/14/12 Assistance Needed: +2    Subjective Data  Subjective: mummbled several times, but unable to tell what she was saying Patient Stated Goal: Pt unable   Prior Functioning     Home Living Lives With: Spouse;Daughter Available Help at Discharge: Family;Available 24 hours/day;Other (Comment) Home Adaptive Equipment: Wheelchair - manual Prior Function Level of Independence: Needs assistance Needs Assistance: Bathing;Dressing;Toileting;Transfers Bath: Total Dressing: Total Toileting: Total Meal Prep: Total Transfer Assistance: Total A to pivot to chair Able to Take Stairs?: No Vocation: Retired Comments: Goes to adult day care Communication Communication: Receptive difficulties;Expressive difficulties (dementia) Dominant Hand:  (unable to determine and pt unable to tell)         Vision/Perception Vision - History Baseline Vision:  (unknown)   Cognition   Cognition Arousal/Alertness: Awake/alert Behavior During Therapy: WFL for tasks assessed/performed Overall Cognitive Status: History of cognitive impairments - at baseline General Comments: Pt not following any commands with right or left arm    Extremity/Trunk Assessment Right Upper Extremity Assessment RUE ROM/Strength/Tone: Deficits RUE ROM/Strength/Tone Deficits: Old CVA, flexion synergy RUE Coordination: Deficits RUE Coordination Deficits: Old CVA Left Upper Extremity Assessment LUE ROM/Strength/Tone:  (spontaneously moves this arm)     Mobility Bed Mobility Bed Mobility: Supine to Sit;Sitting - Scoot to [place] of Bed;Sit to Supine;Scooting to [facility] Supine to Sit: 1: +2 Total assist Supine to Sit: Patient Percentage: 0% Sitting - Scoot to Edge of Bed: 1: +2 Total assist Sitting - Scoot to Edge of Bed: Patient Percentage: 0% Sit to Supine: 1: +2 Total assist Sit to Supine: Patient Percentage: 0% Scooting to HOB: 1: +2 Total assist Scooting to [facility]: Patient Percentage: 0% Transfers Transfers: Sit to Stand;Stand to Sit Sit to Stand: 1: +2 Total assist Sit to Stand: Patient Percentage: 10% (with use of the bed pad) Stand to Sit: 1: +2 Total assist Stand to Sit: Patient Percentage: 10% Details for Transfer Assistance: Able to get pt to ~3/4 standing by using bed pad under hips to lift.        Balance Static Sitting Balance Static Sitting - Balance Support: Left upper extremity supported;Feet supported Static Sitting - Level of Assistance: 4: Min assist Static Sitting - Comment/# of Minutes: 5 minutes   End of Session OT - End of Session Activity Tolerance:  (limited by limited cognition) Patient left: in bed;with bed alarm set (propped on left side)  Evette Georges [phone] 07/14/2012, 9:58 AM

## 2012-07-14 NOTE — Clinical Social Work Note (Signed)
Family provided with bed offers and their preference is [facility] ([facility] Patient will discharge tomorrow (07/15/2012) to [facility] because she requires a 3 day qualifying stay for medicare to pay.   Fernande Boyden, Social Work Intern 07/14/2012

## 2012-07-14 NOTE — Discharge Summary (Signed)
Addendum  Patient seen and examined, chart and data base reviewed.  I agree with the above assessment and discharge plan.  For full details please see Mrs. Algis Downs PA note.  Hypernatremia secondary to dehydration from poor oral intake, discharged to SNF.   Clint Lipps, MD [facility] Pager: [phone] 07/14/2012, 12:18 PM

## 2012-07-14 NOTE — Progress Notes (Signed)
Utilization review complete. Jahlisa Rossitto RN CCM Case Mgmt phone [phone] 

## 2012-07-14 NOTE — Clinical Social Work Placement (Addendum)
Clinical Social Work Department CLINICAL SOCIAL WORK PLACEMENT NOTE 07/14/2012  Patient:  Tracie [name], Tracie [name]  Account Number:  [id number] Admit date:  07/12/2012  Clinical Social Worker:  Genelle Bal, LCSW  Date/time:  07/14/2012 04:34 PM  Clinical Social Work is seeking post-discharge placement for this patient at the following level of care:   SKILLED NURSING   (*CSW will update this form in Epic as items are completed)   07/13/2012  Patient/family provided with [facility] Department of Clinical Social Work's list of facilities offering this level of care within the geographic area requested by the patient (or if unable, by the patient's family).  07/13/2012  Patient/family informed of their freedom to choose among providers that offer the needed level of care, that participate in Medicare, Medicaid or managed care program needed by the patient, have an available bed and are willing to accept the patient.    Patient/family informed of [facility]' ownership interest in [facility], as well as of the fact that they are under no obligation to receive care at this facility.  PASARR submitted to EDS on 07/13/12 PASARR number received from EDS on 07/14/12  FL2 transmitted to all facilities in geographic area requested by pt/family on 07/13/12  FL2 transmitted to all facilities within larger geographic area on   Patient informed that his/her managed care company has contracts with or will negotiate with  certain facilities, including the following:     Patient/family informed of bed offers received:  07/14/2012 Patient chooses bed at [facility] Physician recommends and patient chooses bed at    Patient to be transferred to [facility] on  07/15/2012 Patient to be transferred to facility by ambulance  The following physician request were entered in Epic:   Additional Comments: 07/15/12 - Discharge information forwarded to  facility. Family aware of discharge and mode of transport.  Fernande Boyden, Social Work Intern 07/14/2012

## 2012-07-14 NOTE — Clinical Social Work Psychosocial (Addendum)
Clinical Social Work Department BRIEF PSYCHOSOCIAL ASSESSMENT 07/14/2012  Patient:  Tracie [name], Tracie [name]     Account Number:  [id number]     Admit date:  07/12/2012  Clinical Social Worker:  Delmer Islam  Date/Time:  07/14/2012 04:10 PM  Referred by:  RN  Date Referred:  07/13/2012 Referred for  SNF Placement   Other Referral:   Interview type:  Family Other interview type:   Patient's husband: Tracie [name]  Patient's Daughter: Tracie [name]    PSYCHOSOCIAL DATA Living Status:  HUSBAND Admitted from facility:   Level of care:   Primary support name:  Tracie [name] Primary support relationship to patient:  SPOUSE Degree of support available:   Strong family support. Family appears to show a great deal of concern for the patient.    CURRENT CONCERNS Current Concerns  Post-Acute Placement   Other Concerns:    SOCIAL WORK ASSESSMENT / PLAN CSW intern advised by RN to speak with patient's family regarding SNF placement for the patient. CSW intern introduced self and acknowledged the patient. Patient is alert but appears confused about the information CSW was providing. CSW intern spoke with patients husband (Tracie [name]) and patient's daughter Tracie [name]) regarding plans for discharge. Family expressed that they would like the patient to go to a ALF but was informed by CSW intern of MD recommendation for short term rehab at a SNF before returning home. Family is agreeable to SNF placement for the patient in [place]. CSW intern provided the family with a list of SNF's in [facility] and informed them that the patient's clinical information will be sent to all facilities in [facility]. Family's preferences are (1) [place], (2) [facility], (3) [facility], and (4) [facility]. Once CSW receives facility responses then we will follow up to provide bed offers to the patient and family.   Assessment/plan status:  Psychosocial Support/Ongoing Assessment of Needs Other assessment/ plan:   CSW will  follow up to provide bed offers to the patient and family   Information/referral to community resources:   CSW intern provided a list of SNF in [place] to the patient and family.    PATIENT'S/FAMILY'S RESPONSE TO PLAN OF CARE: Family was appreciative of the services provided by CSW intern.    Tracie [name], Social Work Intern 07/14/2012  07/15/12 - Co-signed: Genelle Bal, LCSW

## 2012-07-14 NOTE — Discharge Summary (Addendum)
Physician Discharge Summary  AMEENA VESEY ZOX:[id number] DOB: Jan 08, 1929 DOA: 07/12/2012  PCP: Default, Provider, MD  Admit date: 07/12/2012 Discharge date: 07/15/2012  Time spent: 45 minutes  Recommendations for Outpatient Follow-up:  Oral intake of water will need to be strongly encouraged at the Skilled Nursing Facility. Patient unable to eat on her own.  Check BMET on 07/17/12  Wound care patient has an ulceration on her arm and multiple stage 2 sacral decubitii.  She will need wound care and pressure ulcer precautions at the nursing facility.   Discharge Diagnoses:  Principal Problem:   Hypernatremia Active Problems:   Dementia   CVA, old, hemiparesis   FTT (failure to thrive) in adult   UTI (lower urinary tract infection)   Dehydration   Discharge Condition: stable.    Diet recommendation: Chopped/soft diet, regular with thin liquids.  Ensure pudding and shake supplementation.  Patient has to be fed.    Filed Weights   07/13/12 0159  Weight: 54.432 kg (120 lb)    History of present illness at the time of admission:  79 yo female with adv dementia and old cva with left sided hemiparesis lives at home with dtr at baseline bedbound, nonverbal comes in with weeks to months of not eating well and significant cognitive decline. No n/v/d. No fevers at home. Sleeps a lot. Developing mult decub wounds. Is dehydrated and has uti.  Hospital Course:   Hypernatremia  Likely due to poor oral intake  Serum sodium peaked at 155.  Decreased to 144 with D5 water. Discussed with family that this may recur, and if it does it may be the beginning of her body naturally shutting down. Due to her poor oral intake (I recommended against PEG).  Patient is a full code per son, [name] and Daughter [name]. Oral intake of water will need to be strongly encouraged at the Skilled Nursing Facility.  Dehydration  Due to poor oral intake  Appreciate nutrition consultation   UTI  cultures are  positive for E-coli Started on Rocephin X 1 dose.  Will be discharged on 5 more days of Bactrim.  Sacral Decubitii (Stage 2)  Wound Care was provided by floor RNs  Dementia with history of CVA  Stable. Mental status at baseline per family  On namenda and Exelon   Discharge Exam: Filed Vitals:   07/14/12 0520 07/14/12 1425 07/14/12 2134 07/15/12 0549  BP: 152/80 141/69 131/77 137/73  Pulse: 74 80 76 80  Temp: 98.4 F (36.9 C) 98.6 F (37 C) 99.3 F (37.4 C) 99.1 F (37.3 C)  TempSrc: Oral Axillary Axillary Axillary  Resp: 20 20 16 16   Height:      Weight:      SpO2: 93% 100% 100% 95%    General: Thin, elderly extremely pleasant, minimally verbal.  Smiling Cardiovascular: rrr no m/r/g Respiratory: CTA no w/c/r no accessory muscle movement. Abdomen:  Thin, soft, nt, nd, +bs Psych:  Patient calm, pleasant, I am unable to determine if she is orientated.  Discharge Instructions      Discharge Orders   Future Appointments Provider Department Dept Phone   11/09/2012 2:00 PM Huston Foley, MD [facility] [phone]   Future Orders Complete By Expires     Diet general  As directed     Comments:      Patient needs strong encouragement to [facility] and eat.  Chopped diet.    Diet general  As directed     Comments:  Regular soft, chopped diet.    Discharge wound care:  As directed     Comments:      Wound Care to left arm and multiple stage 2 sacral decubitii    Increase activity slowly  As directed     Increase activity slowly  As directed         Medication List    TAKE these medications       atorvastatin 20 MG tablet  Commonly known as:  LIPITOR  Take 20 mg by mouth See admin instructions. Takes M-T-W-T-F     divalproex 125 MG DR tablet  Commonly known as:  DEPAKOTE  Take 125 mg by mouth See admin instructions. Takes M-T-W-T-F     feeding supplement Liqd  Take 237 mLs by mouth 2 (two) times daily between meals.     feeding supplement Pudg   Take 1 Container by mouth daily.     memantine 10 MG tablet  Commonly known as:  NAMENDA  Take 10 mg by mouth See admin instructions. Takes M-T-W-T-F     omeprazole 20 MG capsule  Commonly known as:  PRILOSEC  Take 20 mg by mouth See admin instructions. Takes M-T-W-T-F     rivastigmine 9.5 mg/24hr  Commonly known as:  EXELON  Place 1 patch onto the skin See admin instructions. Takes M-T-W-T-F     sulfamethoxazole-trimethoprim 800-160 MG per tablet  Commonly known as:  BACTRIM DS  Take 1 tablet by mouth 2 (two) times daily.          The results of significant diagnostics from this hospitalization (including imaging, microbiology, ancillary and laboratory) are listed below for reference.    Significant Diagnostic Studies: Dg Chest 1 View  07/12/2012  *RADIOLOGY REPORT*  Clinical Data: Weakness  CHEST - 1 VIEW  Comparison: 11/06/2008  Findings: Interstitial coarsening, right apical scarring, and emphysematous changes.  Heart size upper normal to mildly enlarged. Aortic tortuosity.  Mild lung base opacities.  No definite pleural effusion. No pneumothorax.  Osteopenia. Remote right sided rib fractures.  IMPRESSION: COPD.  Mild lung base opacities, atelectasis (favored) versus infiltrate.   Original Report Authenticated By: Jearld Lesch, M.D.    Ct Head Wo Contrast  07/12/2012  *RADIOLOGY REPORT*  Clinical Data: Weakness  CT HEAD WITHOUT CONTRAST  Technique:  Contiguous axial images were obtained from the base of the skull through the vertex without contrast.  Comparison: 05/05/2012  Findings: Motion degraded.  Prominence of the sulci, cisterns, and ventricles, in keeping with volume loss. There are subcortical and periventricular white matter hypodensities, a nonspecific finding most often seen with chronic microangiopathic changes.  There is no evidence for acute hemorrhage, overt hydrocephalus, mass lesion, or abnormal extra-axial fluid collection.  No definite CT evidence for acute  cortical based (large artery) infarction. More confluent area of hypoattenuation within the high left parietal lobe may reflect sequelae of remote infarction, similar to prior.  The visualized paranasal sinuses and mastoid air cells are predominately clear.  IMPRESSION: Motion degrades examination without definitive evidence of acute intracranial process.  Advanced white matter changes, remote high left parietal lobe infarct, and volume loss are similar to prior.  MRI follow-up recommended if concern for acute ischemia persists.   Original Report Authenticated By: Jearld Lesch, M.D.     Microbiology: Recent Results (from the past 240 hour(s))  URINE CULTURE     Status: Abnormal   Collection Time    07/08/12  5:57 PM  Result Value Range Status   Urine Culture, Routine Final report (*)  Final   Result 1 Comment (*)  Final   Comment: Escherichia coli, identified by an automated biochemical system.     50,000-100,000 colony forming units per mL   ANTIMICROBIAL SUSCEPTIBILITY Comment   Final   Comment:     S = Susceptible; I = Intermediate; R = Resistant                         P = Positive; N = Negative                 MICS are expressed in micrograms per mL        Antibiotic                 RSLT#1    RSLT#2    RSLT#3    RSLT#4     Amoxicillin/Clavulanic Acid    S     Ampicillin                     S     Cefepime                       S     Ceftriaxone                    S     Cefuroxime                     S     Cephalothin                    S     Ciprofloxacin                  S     Ertapenem                      S     Gentamicin                     S     Imipenem                       S     Levofloxacin                   S     Nitrofurantoin                 S     Piperacillin                   S     Tetracycline                   S     Tobramycin                     S     Trimethoprim/Sulfa             S  URINE CULTURE     Status: None   Collection Time    07/12/12  9:43  PM      Result Value Range Status   Specimen Description URINE, CATHETERIZED   Final   Special Requests NONE   Final   Culture  Setup Time 07/13/2012 08:46   Final   Colony  Count >=100,000 COLONIES/ML   Final   Culture ESCHERICHIA COLI   Final   Report Status PENDING   Incomplete     Labs: Basic Metabolic Panel:  Recent Labs Lab 07/08/12 1757 07/12/12 2137 07/13/12 0605 07/13/12 1555 07/14/12 0700  NA 152* 150* 155* 144 143  K 3.8 3.8 3.4* 4.5 3.7  CL 110* 112 119* 108 107  CO2 26 28 27 26 28   GLUCOSE 108* 152* 140* 176* 153*  BUN 20 20 18 17 13   CREATININE 0.76 0.76 0.70 0.68 0.62  CALCIUM 9.4 9.0 8.5 8.5 8.5   Liver Function Tests:  Recent Labs Lab 07/08/12 1757 07/12/12 2137  AST 19 17  ALT 13 14  ALKPHOS 130* 108  BILITOT 0.5 0.4  PROT 6.4 6.6  ALBUMIN  --  2.9*   CBC:  Recent Labs Lab 07/08/12 1757 07/12/12 2137 07/13/12 0605 07/14/12 0700  WBC 8.5 10.9* 9.1 9.1  NEUTROABS 5.8 8.5*  --   --   HGB 13.0 12.3 10.6* 10.6*  HCT 41.3 38.1 33.4* 32.2*  MCV 89 89.9 88.8 86.6  PLT  --  124* 129* 140*    CBG:  Recent Labs Lab 07/14/12 0748 07/14/12 1154 07/14/12 1651 07/14/12 2144 07/15/12 0806  GLUCAP 132* 157* 141* 178* 118*     Signed:  Stephani Police, PA-C [facility] 07/15/2012, 10:44 AM   Attending -Patient was seen and examined, she is pleasantly confused. Per RN eating a significant amount of her meals. Ok to discharge back to SNF. She will need appropriate water intake while at SNF. Agree with the assessment and plan as outlined above.  S Ghimire

## 2012-07-15 LAB — GLUCOSE, CAPILLARY

## 2012-07-15 MED ORDER — SULFAMETHOXAZOLE-TRIMETHOPRIM 800-160 MG PO TABS: 1.0000 | ORAL_TABLET | Freq: Two times a day (BID) | ORAL | Status: DC

## 2012-07-15 MED ORDER — SULFAMETHOXAZOLE-TMP DS 800-160 MG PO TABS
1.0000 | ORAL_TABLET | Freq: Two times a day (BID) | ORAL | Status: DC
  Administered 2012-07-15: 1 via ORAL
  Filled 2012-07-15 (×2): qty 1

## 2012-07-15 MED ORDER — SODIUM CHLORIDE 0.45 % IV SOLN: INTRAVENOUS | Status: DC

## 2012-07-15 NOTE — Progress Notes (Signed)
NURSING PROGRESS NOTE  Tracie [name] [id number] Discharge Data: 07/15/2012 7:39 PM Attending Provider: No att. providers found QIO:[id number], Provider, MD     [name] to be D/C'd Skilled nursing facility per MD order.  All IV's discontinued with no bleeding noted. All belongings returned to patient for patient to take home.   Last Vital Signs:  Blood pressure 114/70, pulse 87, temperature 98.4 F (36.9 C), temperature source Axillary, resp. rate 16, height 5' (1.524 m), weight 54.432 kg (120 lb), SpO2 97.00%.  Discharge Medication List   Medication List    TAKE these medications       atorvastatin 20 MG tablet  Commonly known as:  LIPITOR  Take 20 mg by mouth See admin instructions. Takes M-T-W-T-F     divalproex 125 MG DR tablet  Commonly known as:  DEPAKOTE  Take 125 mg by mouth See admin instructions. Takes M-T-W-T-F     feeding supplement Liqd  Take 237 mLs by mouth 2 (two) times daily between meals.     feeding supplement Pudg  Take 1 Container by mouth daily.     memantine 10 MG tablet  Commonly known as:  NAMENDA  Take 10 mg by mouth See admin instructions. Takes M-T-W-T-F     omeprazole 20 MG capsule  Commonly known as:  PRILOSEC  Take 20 mg by mouth See admin instructions. Takes M-T-W-T-F     rivastigmine 9.5 mg/24hr  Commonly known as:  EXELON  Place 1 patch onto the skin See admin instructions. Takes M-T-W-T-F     sulfamethoxazole-trimethoprim 800-160 MG per tablet  Commonly known as:  BACTRIM DS  Take 1 tablet by mouth 2 (two) times daily.

## 2012-07-15 NOTE — Progress Notes (Signed)
Patient seen and examined, agree with the noted assessment an plan. She will need liberal water intake at the SNF. Poor overall prognosis. Back to SNF today

## 2012-07-15 NOTE — Progress Notes (Signed)
[facility] PROGRESS NOTE  Tracie [name] ZOX:[id number] DOB: 22-Jul-1928 DOA: 07/12/2012 PCP: Default, Provider, MD  Assessment/Plan:  Hypernatremia  Likely due to poor oral intake  Serum sodium peaked at 155. Decreased to 144 with D5 water.  Discussed with family that this may recur, and if it does it may be the beginning of her body naturally shutting down. Due to her poor oral intake (I recommended against PEG). Patient is a full code per son, Tracie [name] and Daughter Tracie [name].  Oral intake of water will need to be strongly encouraged at the Skilled Nursing Facility.   Dehydration  Due to poor oral intake.  Now improved.  Patient eating better.  She will drink with encouragement. Appreciate nutrition consultation   UTI  cultures are positive for E-coli  Started on Rocephin X 1 dose. Will be discharged on 5 more days of Bactrim.   Sacral Decubitii (Stage 2)  Wound Care was provided by floor RNs   Dementia with history of CVA  Stable. Mental status at baseline per family  On namenda and Exelon    DVT Prophylaxis:  SCDs and lovenox  Code Status: Full Code.  Family Communication: Supportive Family (husband, son and daughter) Disposition Plan: to SNF  Antibiotics:  Bactrim   HPI/Subjective: Patient admitted with UTI and Hypernatremia.  Received D5 Water and Antibiotics.  After discussions family feels she would be better cared for in a SNF where she can receive physical therapy and closer monitoring as well as feeding assistance.  Patient with no complaints.  Objective: Filed Vitals:   07/14/12 0520 07/14/12 1425 07/14/12 2134 07/15/12 0549  BP: 152/80 141/69 131/77 137/73  Pulse: 74 80 76 80  Temp: 98.4 F (36.9 C) 98.6 F (37 C) 99.3 F (37.4 C) 99.1 F (37.3 C)  TempSrc: Oral Axillary Axillary Axillary  Resp: 20 20 16 16   Height:      Weight:      SpO2: 93% 100% 100% 95%    Intake/Output Summary (Last 24 hours) at 07/15/12 1043 Last data filed at 07/14/12  1300  Gross per 24 hour  Intake    120 ml  Output      0 ml  Net    120 ml   Filed Weights   07/13/12 0159  Weight: 54.432 kg (120 lb)    Exam:   General:  A&O, NAD, Lying comfortably in bed,  Wakes easily from sleep.    Cardiovascular: RRR, no murmurs, rubs or gallops, no lower extremity edema  Respiratory: CTA, no wheeze, crackles, or rales.  No increased work of breathing.  Abdomen: Soft, non-tender, non-distended, + bowel sounds, no masses  Musculoskeletal: Able to move all 4 extremities, 5/5 strength in each  Psych:  Patient very pleasant, verbalizes one word answers that are not appropriate to the question.  Severely demented.  Data Reviewed: Basic Metabolic Panel:  Recent Labs Lab 07/08/12 1757 07/12/12 2137 07/13/12 0605 07/13/12 1555 07/14/12 0700  NA 152* 150* 155* 144 143  K 3.8 3.8 3.4* 4.5 3.7  CL 110* 112 119* 108 107  CO2 26 28 27 26 28   GLUCOSE 108* 152* 140* 176* 153*  BUN 20 20 18 17 13   CREATININE 0.76 0.76 0.70 0.68 0.62  CALCIUM 9.4 9.0 8.5 8.5 8.5   Liver Function Tests:  Recent Labs Lab 07/08/12 1757 07/12/12 2137  AST 19 17  ALT 13 14  ALKPHOS 130* 108  BILITOT 0.5 0.4  PROT 6.4 6.6  ALBUMIN  --  2.9*   CBC:  Recent Labs Lab 07/08/12 1757 07/12/12 2137 07/13/12 0605 07/14/12 0700  WBC 8.5 10.9* 9.1 9.1  NEUTROABS 5.8 8.5*  --   --   HGB 13.0 12.3 10.6* 10.6*  HCT 41.3 38.1 33.4* 32.2*  MCV 89 89.9 88.8 86.6  PLT  --  124* 129* 140*     Recent Labs Lab 07/14/12 0748 07/14/12 1154 07/14/12 1651 07/14/12 2144 07/15/12 0806  GLUCAP 132* 157* 141* 178* 118*    Recent Results (from the past 240 hour(s))  URINE CULTURE     Status: Abnormal   Collection Time    07/08/12  5:57 PM      Result Value Range Status   Urine Culture, Routine Final report (*)  Final   Result 1 Comment (*)  Final   Comment: Escherichia coli, identified by an automated biochemical system.     50,000-100,000 colony forming units per mL    ANTIMICROBIAL SUSCEPTIBILITY Comment   Final   Comment:    S = Susceptible; I = Intermediate; R = Resistant                         P = Positive; N = Negative                 MICS are expressed in micrograms per mL        Antibiotic                 RSLT#1    RSLT#2    RSLT#3    RSLT#4     Amoxicillin/Clavulanic Acid    S     Ampicillin                     S     Cefepime                       S     Ceftriaxone                    S     Cefuroxime                     S     Cephalothin                    S     Ciprofloxacin                  S     Ertapenem                      S     Gentamicin                     S     Imipenem                       S     Levofloxacin                   S     Nitrofurantoin                 S     Piperacillin                   S     Tetracycline  S     Tobramycin                     S     Trimethoprim/Sulfa             S  URINE CULTURE     Status: None   Collection Time    07/12/12  9:43 PM      Result Value Range Status   Specimen Description URINE, CATHETERIZED   Final   Special Requests NONE   Final   Culture  Setup Time 07/13/2012 08:46   Final   Colony Count >=100,000 COLONIES/ML   Final   Culture ESCHERICHIA COLI   Final   Report Status 07/14/2012 FINAL   Final   Organism ID, Bacteria ESCHERICHIA COLI   Final     Studies: No results found.  Scheduled Meds: . atorvastatin  20 mg Oral Custom  . cefTRIAXone (ROCEPHIN)  IV  1 g Intravenous QHS  . divalproex  125 mg Oral Custom  . enoxaparin (LOVENOX) injection  40 mg Subcutaneous Daily  . feeding supplement  1 Container Oral Q24H  . feeding supplement  237 mL Oral BID BM  . insulin aspart  0-9 Units Subcutaneous TID WC  . memantine  10 mg Oral Custom  . pneumococcal 23 valent vaccine  0.5 mL Intramuscular Tomorrow-1000  . rivastigmine  9.5 mg Transdermal Custom   Continuous Infusions: . sodium chloride      Principal Problem:   Hypernatremia Active Problems:    Dementia   CVA, old, hemiparesis   FTT (failure to thrive) in adult   UTI (lower urinary tract infection)   Dehydration    Conley Canal [facility] Pager [phone]. If 7PM-7AM, please contact night-coverage at [email or web address], password [facility] 07/15/2012, 10:43 AM  LOS: 3 days

## 2012-07-16 ENCOUNTER — Non-Acute Institutional Stay: Payer: Self-pay | Admitting: Family Medicine

## 2012-07-16 ENCOUNTER — Non-Acute Institutional Stay: Payer: Medicare Other | Admitting: Family Medicine

## 2012-07-16 ENCOUNTER — Encounter: Payer: Self-pay | Admitting: Family Medicine

## 2012-07-16 DIAGNOSIS — F0150 Vascular dementia without behavioral disturbance: Secondary | ICD-10-CM

## 2012-07-16 DIAGNOSIS — F0390 Unspecified dementia without behavioral disturbance: Secondary | ICD-10-CM

## 2012-07-16 DIAGNOSIS — I69959 Hemiplegia and hemiparesis following unspecified cerebrovascular disease affecting unspecified side: Secondary | ICD-10-CM

## 2012-07-16 DIAGNOSIS — E870 Hyperosmolality and hypernatremia: Secondary | ICD-10-CM

## 2012-07-16 DIAGNOSIS — I69359 Hemiplegia and hemiparesis following cerebral infarction affecting unspecified side: Secondary | ICD-10-CM

## 2012-07-16 DIAGNOSIS — N390 Urinary tract infection, site not specified: Secondary | ICD-10-CM

## 2012-07-16 DIAGNOSIS — L8992 Pressure ulcer of unspecified site, stage 2: Secondary | ICD-10-CM

## 2012-07-16 NOTE — Progress Notes (Signed)
Subjective:     Patient ID: Tracie [name], female   DOB: 1928-12-16, 79 y.o.   MRN: [id number]  HPI   Review of Systems     Objective:   Physical Exam     Assessment:     I interviewed and examined this patient and discussed the treatment plan with Dr Tye Savoy       Plan:     Per Dr Tye Savoy

## 2012-07-16 NOTE — Assessment & Plan Note (Signed)
E. Coli UTI.  Resume Bactrim from hospital, although this is not first choice antibiotic for elderly.

## 2012-07-16 NOTE — Assessment & Plan Note (Addendum)
Moderately severe dementia, needs assistance with all ADL.  Patient admitted for short term rehab.  Husband hopes to discharge patient to ALF where their daughter works.  Will monitor progress.

## 2012-07-16 NOTE — Care Management Note (Signed)
    Page 1 of 1   07/16/2012     5:20:38 PM   CARE MANAGEMENT NOTE 07/16/2012  Patient:  Tracie [name], Tracie [name]   Account Number:  [id number]  Date Initiated:  07/16/2012  Documentation initiated by:  Letha Cape  Subjective/Objective Assessment:   dx hypernatremia  admitp- lives with family.     Action/Plan:   Anticipated DC Date:  07/15/2012   Anticipated DC Plan:  SKILLED NURSING FACILITY  In-house referral  Clinical Social Worker      DC Planning Services  CM consult      Choice offered to / List presented to:             Status of service:  Completed, signed off Medicare Important Message given?   (If response is "NO", the following Medicare IM given date fields will be blank) Date Medicare IM given:   Date Additional Medicare IM given:    Discharge Disposition:  SKILLED NURSING FACILITY  Per UR Regulation:  Reviewed for med. necessity/level of care/duration of stay  If discussed at Long Length of Stay Meetings, dates discussed:    Comments:  07/16/12 17:19 Letha Cape RN, BSN [phone] patient dc to [facility]. CSW following.

## 2012-07-16 NOTE — Assessment & Plan Note (Signed)
Continue routine wound care.  Will continue to monitor.

## 2012-07-16 NOTE — Progress Notes (Signed)
  Subjective:    Patient ID: Tracie [name], female    DOB: Aug 17, 1928, 79 y.o.   MRN: [id number]  HPI  PCP: Dr. Sander Radon at [facility] Neurologist: Dr. Sandria Manly  79 yo F with moderately severe dementia and previous CVA with RT sided hemiparesis admitted to [facility] for short term rehab.  Patient was previously living at home with husband, Remi Deter, and daughter.  Patient was admitted to [facility] on 07/12/2012 for altered mental status and was diagnosed with UTI and dehydration.  Patient was unable to perform any ADL (including eating on her own), so she was discharged to SNF.  Per husband, patient's functional decline occurred after CVA about 3 years ago.  She developed slurred speech and RT sided hemiparesis.  In the last 12 months, patient has continued to decline and she cannot communicate, urinate, stand/walk, or eat on her own.  Husband says she becomes agitated at times, but then will smile and laugh shortly after.  Husband has been taking care of her at home, but lately, this has become mor difficult for him and his daughter.  His goal is for patient to move to ALF [facility]) where his daughter works.  Past Medical History  Diagnosis Date  . Stroke   . Hypertension   . Wheelchair dependence   . Reflux esophagitis   . Dementia in conditions classified elsewhere with behavioral disturbance(294.11)   . Loss of weight   . Unspecified vitamin D deficiency   . Other and unspecified hyperlipidemia   . Senile dementia, uncomplicated   . Anxiety state, unspecified   . Unspecified late effects of cerebrovascular disease   . Other malaise and fatigue   . Abnormality of gait    Social History: patient is retired - worked for an [employer], [employer].  Former smoker - smoked cigarettes for > 20 years, and quit about 10 years ago.  No current alcohol or drug abuse.  Lives at home with husband (married 64 years) and has 2 daughters and 1 son who live in [place].  Review of Systems Per HPI     Objective:   Physical Exam  Constitutional: No distress.  Thin  HENT:  Head: Normocephalic and atraumatic.  MMM dry  Eyes: Conjunctivae are normal. Pupils are equal, round, and reactive to light. No scleral icterus.  Cardiovascular: Normal rate and regular rhythm.   Pulmonary/Chest: Effort normal and breath sounds normal.  Abdominal: Soft. Bowel sounds are normal. She exhibits no distension. There is no rebound and no guarding.  Neurological:  Non-communicative; does moan in pain during physical exam; RT side hemiparesis with RT UE contracture, unable to straighten out both LT and RT LE.  Psychiatric:  Stage 2 decubitus ulcer on RT side of bottom; skin is dry diffusely; RT knee post-op scar       Assessment & Plan:

## 2012-07-16 NOTE — Assessment & Plan Note (Signed)
Due to dehydration.  Will encourage PO intake.  Husband seems motivated to help patient with feeding.  Repeat BMET in one week.

## 2012-07-22 ENCOUNTER — Ambulatory Visit: Payer: Self-pay | Admitting: Internal Medicine

## 2012-07-28 NOTE — Assessment & Plan Note (Addendum)
Advanced, unable to communicate

## 2012-07-28 NOTE — Assessment & Plan Note (Signed)
Very limited mobility

## 2012-08-04 ENCOUNTER — Non-Acute Institutional Stay (SKILLED_NURSING_FACILITY): Payer: Medicare Other | Admitting: Nurse Practitioner

## 2012-08-04 ENCOUNTER — Encounter: Payer: Self-pay | Admitting: Nurse Practitioner

## 2012-08-04 DIAGNOSIS — L8992 Pressure ulcer of unspecified site, stage 2: Secondary | ICD-10-CM

## 2012-08-04 DIAGNOSIS — I69359 Hemiplegia and hemiparesis following cerebral infarction affecting unspecified side: Secondary | ICD-10-CM

## 2012-08-04 DIAGNOSIS — R627 Adult failure to thrive: Secondary | ICD-10-CM

## 2012-08-04 DIAGNOSIS — L8990 Pressure ulcer of unspecified site, unspecified stage: Secondary | ICD-10-CM

## 2012-08-04 DIAGNOSIS — I69959 Hemiplegia and hemiparesis following unspecified cerebrovascular disease affecting unspecified side: Secondary | ICD-10-CM

## 2012-08-04 DIAGNOSIS — F0150 Vascular dementia without behavioral disturbance: Secondary | ICD-10-CM

## 2012-08-04 NOTE — Assessment & Plan Note (Signed)
Advanced- unable to communicate and unable to do adls

## 2012-08-04 NOTE — Assessment & Plan Note (Signed)
Right sided hemiplegia; limited mobility

## 2012-08-04 NOTE — Assessment & Plan Note (Signed)
Cont to follow by wound care

## 2012-08-04 NOTE — Assessment & Plan Note (Signed)
conts to lose weight- appears to be holding food in mouth; nursing reports she is slow to swallow- will get ST consult at this time

## 2012-08-04 NOTE — Progress Notes (Signed)
Patient ID: Tracie [name], female   DOB: [birth year], 79 y.o.   MRN: [id number]  Chief Complaint: AV: transfer of service   HPI:  79 yo F with moderately severe dementia and previous CVA with RT sided hemiparesis admitted to [facility] for short term rehab. Patient was previously living at home with husband, Remi Deter, and daughter. Patient was admitted to [facility] on 07/12/2012 for altered mental status and was diagnosed with UTI and dehydration. Patient was unable to perform any ADL (including eating on her own), so she was discharged to SNF. Pt with advanced dementia and unable to provide any information- pts family called with no answers. Staff without any concerns at this time.  Review of Systems:  Review of Systems  Unable to perform ROS: dementia    Medications: Patient's Medications  New Prescriptions   No medications on file  Previous Medications   ATORVASTATIN (LIPITOR) 20 MG TABLET    Take 20 mg by mouth See admin instructions. Takes M-T-W-T-F   DIVALPROEX (DEPAKOTE) 125 MG DR TABLET    Take 125 mg by mouth See admin instructions. Takes M-T-W-T-F   FEEDING SUPPLEMENT (ENSURE) PUDG    Take 1 Container by mouth daily.   FEEDING SUPPLEMENT (GLUCERNA SHAKE) LIQD    Take 237 mLs by mouth 2 (two) times daily between meals.   MEMANTINE (NAMENDA) 10 MG TABLET    Take 10 mg by mouth See admin instructions. Takes M-T-W-T-F   OMEPRAZOLE (PRILOSEC) 20 MG CAPSULE    Take 20 mg by mouth See admin instructions. Takes M-T-W-T-F   RIVASTIGMINE (EXELON) 9.5 MG/24HR    Place 1 patch onto the skin See admin instructions. Takes M-T-W-T-F   VITAMIN D, ERGOCALCIFEROL, (DRISDOL) 50000 UNITS CAPS    Take 50,000 Units by mouth every 7 (seven) days.  Modified Medications   No medications on file  Discontinued Medications   SULFAMETHOXAZOLE-TRIMETHOPRIM (BACTRIM DS) 800-160 MG PER TABLET    Take 1 tablet by mouth 2 (two) times daily.     Physical Exam: Physical Exam  Constitutional: She appears cachectic. She  has a sickly appearance. No distress.  HENT:  Head: Normocephalic and atraumatic.  Cardiovascular: Normal rate, regular rhythm and normal heart sounds.   Pulmonary/Chest: Effort normal and breath sounds normal.  Abdominal: Soft. Bowel sounds are normal. She exhibits no distension. There is no tenderness.  Musculoskeletal: She exhibits no edema and no tenderness.  Skin: Skin is warm and dry. She is not diaphoretic.     Filed Vitals:   08/04/12 1130  BP: 138/74  Pulse: 84  Temp: 98.7 F (37.1 C)  Resp: 20  Weight: 109 lb (49.442 kg)      Labs reviewed: Basic Metabolic Panel:  Recent Labs  78/46/96 0605 07/13/12 1555 07/14/12 0700  NA 155* 144 143  K 3.4* 4.5 3.7  CL 119* 108 107  CO2 27 26 28   GLUCOSE 140* 176* 153*  BUN 18 17 13   CREATININE 0.70 0.68 0.62  CALCIUM 8.5 8.5 8.5    Liver Function Tests:  Recent Labs  08/15/11 1207 07/08/12 1757 07/12/12 2137  AST 19 19 17   ALT 14 13 14   ALKPHOS 122* 130* 108  BILITOT 0.4 0.5 0.4  PROT 7.8 6.4 6.6  ALBUMIN 4.2  --  2.9*    CBC:  Recent Labs  05/06/12 0900 07/08/12 1757 07/12/12 2137 07/13/12 0605 07/14/12 0700  WBC 5.8 8.5 10.9* 9.1 9.1  NEUTROABS  --  5.8 8.5*  --   --  HGB 10.7* 13.0 12.3 10.6* 10.6*  HCT 31.8* 41.3 38.1 33.4* 32.2*  MCV 85.0 89 89.9 88.8 86.6  PLT 137*  --  124* 129* 140*     Assessment/Plan FTT (failure to thrive) in adult conts to lose weight- appears to be holding food in mouth; nursing reports she is slow to swallow- will get ST consult at this time  Pressure ulcer, stage 2 Cont to follow by wound care  CVA, old, hemiparesis Right sided hemiplegia; limited mobility   Vascular dementia Advanced- unable to communicate and unable to do adls   Family/ staff Communication: attempted to call family regarding goals of care- no answer and unable to leave msg

## 2012-09-07 ENCOUNTER — Non-Acute Institutional Stay (SKILLED_NURSING_FACILITY): Payer: Medicare Other | Admitting: Nurse Practitioner

## 2012-09-07 ENCOUNTER — Encounter: Payer: Self-pay | Admitting: Nurse Practitioner

## 2012-09-07 DIAGNOSIS — E559 Vitamin D deficiency, unspecified: Secondary | ICD-10-CM

## 2012-09-07 DIAGNOSIS — R627 Adult failure to thrive: Secondary | ICD-10-CM

## 2012-09-07 DIAGNOSIS — I69959 Hemiplegia and hemiparesis following unspecified cerebrovascular disease affecting unspecified side: Secondary | ICD-10-CM

## 2012-09-07 DIAGNOSIS — I69359 Hemiplegia and hemiparesis following cerebral infarction affecting unspecified side: Secondary | ICD-10-CM

## 2012-09-07 DIAGNOSIS — F0150 Vascular dementia without behavioral disturbance: Secondary | ICD-10-CM

## 2012-09-07 NOTE — Progress Notes (Signed)
Patient ID: Tracie [name], female   DOB: 1928/07/03, 79 y.o.   MRN: [id number]  Nursing Home Location:  [facility]   Place of Service: SNF (31)   Chief Complaint: medical management of chronic conditions  HPI:  79 yo F with severe dementia and previous CVA with RT sided hemiparesis who is a resident of [facility] is being seen today for routine follow up. Pt doing well; getting up eating meals with the assistance of staff, smiles but is non-communititive. Staff without any concerns at this time.    Review of Systems:  Unable to perform due to dementia Medications: Patient's Medications  New Prescriptions   No medications on file  Previous Medications   ATORVASTATIN (LIPITOR) 20 MG TABLET    Take 20 mg by mouth daily.    DIVALPROEX (DEPAKOTE) 125 MG DR TABLET    Take 125 mg by mouth daily. Takes M-T-W-T-F   FEEDING SUPPLEMENT (ENSURE) PUDG    Take 1 Container by mouth daily.   FEEDING SUPPLEMENT (GLUCERNA SHAKE) LIQD    Take 237 mLs by mouth 2 (two) times daily between meals.   MEMANTINE (NAMENDA) 10 MG TABLET    Take 10 mg by mouth daily.    RIVASTIGMINE (EXELON) 9.5 MG/24HR    Place 1 patch onto the skin daily.    VITAMIN D, ERGOCALCIFEROL, (DRISDOL) 50000 UNITS CAPS    Take 50,000 Units by mouth every 7 (seven) days.  Modified Medications   No medications on file  Discontinued Medications   OMEPRAZOLE (PRILOSEC) 20 MG CAPSULE    Take 20 mg by mouth See admin instructions. Takes M-T-W-T-F     Physical Exam:  Filed Vitals:   09/07/12 1028  BP: 131/87  Pulse: 66  Temp: 96.6 F (35.9 C)  Resp: 18  Physical Exam  Vitals reviewed. Constitutional: Vital signs are normal. No distress.  HENT:  Head: Normocephalic and atraumatic.  Eyes: Conjunctivae and EOM are normal. Pupils are equal, round, and reactive to light.  Cardiovascular: Normal rate and regular rhythm.   Pulmonary/Chest: Effort normal and breath sounds normal.  Abdominal: Soft. Bowel sounds are  normal. She exhibits no distension.  Musculoskeletal: She exhibits no edema and no tenderness.  currently in [facility]- with right sided hemiparesis   Neurological: She is alert.  Skin: Skin is warm and dry. She is not diaphoretic.        Assessment/Plan   1.   Vascular dementia, without behavioral disturbance 290.40     stable   2.   FTT (failure to thrive) in adult 783.7     conts to lose weight- is on supplements and eating well per staff- will cont to monitor    3.   CVA, old, hemiparesis   Will cont current medication   4. Vit D deficiency   follow up vit d level scheduled    Labs/tests ordered Cbc, vit d, cmp

## 2012-09-08 ENCOUNTER — Non-Acute Institutional Stay: Payer: Self-pay | Admitting: Family Medicine

## 2012-09-08 DIAGNOSIS — R627 Adult failure to thrive: Secondary | ICD-10-CM

## 2012-09-08 NOTE — Progress Notes (Signed)
Patient ID: Tracie [name], female   DOB: [birth year], 79 y.o.   MRN: [id number] Documentation only

## 2012-10-05 ENCOUNTER — Non-Acute Institutional Stay (SKILLED_NURSING_FACILITY): Payer: Medicare Other | Admitting: Nurse Practitioner

## 2012-10-05 ENCOUNTER — Encounter: Payer: Self-pay | Admitting: Nurse Practitioner

## 2012-10-05 DIAGNOSIS — F0150 Vascular dementia without behavioral disturbance: Secondary | ICD-10-CM

## 2012-10-05 DIAGNOSIS — E559 Vitamin D deficiency, unspecified: Secondary | ICD-10-CM

## 2012-10-05 DIAGNOSIS — R627 Adult failure to thrive: Secondary | ICD-10-CM

## 2012-10-05 NOTE — Progress Notes (Signed)
Patient ID: Tracie [name], female   DOB: 1928/05/20, 79 y.o.   MRN: [id number],   Nursing Home Location:  [facility]   Place of Service: SNF (31)  Chief Complaint  Patient presents with  . Medical Managment of Chronic Issues    HPI:  79 yo F with severe dementia and previous CVA with RT sided hemiparesis who is a resident of [facility] is being seen today for routine follow up. Pt doing well; getting up eating meals with the assistance of staff, smiles but is non-communititive. Staff without any concerns at this time Hyperlipidemia- currently on lipitor 20 mg daily Dementia- currently on namenda XR and exelon patch  Vit D def-currently on Vit D   Review of Systems:  Unable to perform due to dementia   Medications: Patient's Medications  New Prescriptions   No medications on file  Previous Medications   ATORVASTATIN (LIPITOR) 20 MG TABLET    Take 20 mg by mouth daily.    DIVALPROEX (DEPAKOTE) 125 MG DR TABLET    Take 125 mg by mouth daily.    FEEDING SUPPLEMENT (ENSURE) PUDG    Take 1 Container by mouth daily.   FEEDING SUPPLEMENT (GLUCERNA SHAKE) LIQD    Take 237 mLs by mouth 2 (two) times daily between meals.   MEMANTINE (NAMENDA) 10 MG TABLET    Take 10 mg by mouth daily.    RIVASTIGMINE (EXELON) 9.5 MG/24HR    Place 1 patch onto the skin daily.   Modified Medications   No medications on file  Discontinued Medications   VITAMIN D, ERGOCALCIFEROL, (DRISDOL) 50000 UNITS CAPS    Take 50,000 Units by mouth every 7 (seven) days.     Physical Exam:  Filed Vitals:   10/05/12 1231  BP: 136/72  Pulse: 78  Temp: 97.6 F (36.4 C)  Resp: 20  Weight: 108 lb (48.988 kg)    Vitals reviewed. Nursing notes reviewed.  Constitutional: Vital signs are normal. No distress.  HENT: Head: Normocephalic and atraumatic.  Eyes: Conjunctivae and EOM are normal. Pupils are equal, round, and reactive to light.  Cardiovascular: Normal rate and regular rhythm.   Pulmonary/Chest: Effort normal and breath sounds normal.  Abdominal: Soft. Bowel sounds are normal. She exhibits no distension.  Musculoskeletal: She exhibits no edema and no tenderness. with right sided hemiparesis  Neurological: She is alert.  Skin: Skin is warm and dry. She is not diaphoretic.    Labs reviewed/Significant Diagnostic Results: Lipid Profile       Result: 09/30/2012 4:06 PM    ( Status: F )       C     Cholesterol  168        0-200  mg/dL  SLN  C     Triglyceride  40        <150  mg/dL  SLN       HDL Cholesterol  45        >39  mg/dL  SLN       Total Chol/HDL Ratio  3.7         Ratio  SLN       VLDL Cholesterol (Calc)  8        0-40  mg/dL  SLN       LDL Cholesterol (Calc)  115     H  0-99  mg/dL  SLN  C    Liver Profile       Result: 09/30/2012 4:06 PM    (  Status: F )            Bilirubin, Total  0.4        0.3-1.2  mg/dL  SLN       Bilirubin, Direct  0.1        0.0-0.3  mg/dL  SLN       Indirect Bilirubin  0.3        0.0-0.9  mg/dL  SLN       Alkaline Phosphatase  106        39-117  U/L  SLN       AST/SGOT  13        0-37  U/L  SLN       ALT/SGPT  8        0-35  U/L  SLN       Total Protein  5.8     L  6.0-8.3  g/dL  SLN       Albumin  3.3    CBC with Diff       Result: 09/21/2012 1:40 PM    ( Status: F )            WBC  5.4        4.0-10.5  K/uL  SLN       RBC  3.73     L  4.22-5.81  MIL/uL  SLN       Hemoglobin  9.4     L  13.0-17.0  g/dL  SLN       Hematocrit  29.5     L  39.0-52.0  %  SLN       MCV  79.1        78.0-100.0  fL  SLN       MCH  25.2     L  26.0-34.0  pg  SLN       MCHC  31.9        30.0-36.0  g/dL  SLN       RDW  16.1        11.5-15.5  %  SLN       Platelet Count  243        150-400  K/uL  SLN       Granulocyte %  57        43-77  %  SLN       Absolute Gran  3.0        1.7-7.7  K/uL  SLN       Lymph %  34        12-46  %  SLN       Absolute Lymph  1.8        0.7-4.0  K/uL  SLN       Mono %  8        3-12  %  SLN       Absolute Mono  0.5         0.1-1.0  K/uL  SLN       Eos %  1        0-5  %  SLN       Absolute Eos  0.1        0.0-0.7  K/uL  SLN       Baso %  0        0-1  %  SLN       Absolute Baso  0.0        0.0-0.1  K/uL  SLN       Smear Review  NOTE   SLN  C    Comprehensive Metabolic Panel       Result: 09/21/2012 1:05 PM    ( Status: F )            Sodium  140        135-145  mEq/L  SLN       Potassium  3.9        3.5-5.3  mEq/L  SLN       Chloride  103        96-112  mEq/L  SLN       CO2  27        19-32  mEq/L  SLN       Glucose  98        70-99  mg/dL  SLN       BUN  14        6-23  mg/dL  SLN       Creatinine  0.60        0.50-1.35  mg/dL  SLN       Bilirubin, Total  0.4        0.3-1.2  mg/dL  SLN       Alkaline Phosphatase  100        39-117  U/L  SLN       AST/SGOT  14        0-37  U/L  SLN       ALT/SGPT  <8        0-53  U/L  SLN       Total Protein  5.8     L  6.0-8.3  g/dL  SLN       Albumin  3.1     L  3.5-5.2  g/dL  SLN       Calcium  8.3     L  8.4-10.5  mg/dL  SLN      Hemoglobin Y7W       Result: 09/21/2012 4:04 PM    ( Status: F )            Hemoglobin A1C  6.1     H  <5.7  %  SLN  C     Estimated Average Glucose  128     H  <117  mg/dL  SLN      Vitamin D (29-FAOZHYQ)       Result: 09/22/2012 12:38 AM    ( Status: F )            Vitamin D (25-Hydroxy)  35             Assessment/Plan 1. Vascular dementia, without behavioral disturbance Will change namenda to XR - Memantine HCl ER 28 MG CP24; Take 28 mg by mouth daily. to help preserve memory.  Dispense: 30 capsule; Refill: 5  2. FTT (failure to thrive) in adult Weight has increased; doing well in current living setting  3. Unspecified vitamin D deficiency Stable; will cont Vit D at this time.

## 2012-10-23 ENCOUNTER — Other Ambulatory Visit: Payer: Self-pay | Admitting: Geriatric Medicine

## 2012-10-23 MED ORDER — ALPRAZOLAM 0.25 MG PO TABS: 0.2500 mg | ORAL_TABLET | Freq: Three times a day (TID) | ORAL | Status: DC

## 2012-10-24 ENCOUNTER — Encounter: Payer: Self-pay | Admitting: Nurse Practitioner

## 2012-10-24 DIAGNOSIS — R251 Tremor, unspecified: Secondary | ICD-10-CM | POA: Insufficient documentation

## 2012-10-24 MED ORDER — MEMANTINE HCL ER 28 MG PO CP24: 28.0000 mg | ORAL_CAPSULE | Freq: Every day | ORAL | Status: DC

## 2012-10-24 NOTE — Progress Notes (Signed)
  Subjective:    Patient ID: Tracie [name], female    DOB: October 24, 1928, 79 y.o.   MRN: [id number]  HPI Elderly demented patient in room 62 [facility] was evaluated today at the family request for a jerking/tremor. She was not jerking at the time I saw her. Apparently this is intermittent. It is not accompanied by a fever. She does have increased rigidity.. There is titubation.  Medications were reviewed. See nursing home records.  Review of Systems  Constitutional: Positive for fatigue. Negative for fever, chills and diaphoresis.  Respiratory: Negative for cough, shortness of breath and wheezing.   Cardiovascular: Negative for chest pain, palpitations and leg swelling.  Genitourinary:       Incontinence of urine.  Musculoskeletal:       Generalized weakness. Hemiparesis.  Skin: Negative.   Neurological:       Hemiparesis. Dementia. Tremors. Generalized weakness. No history of seizures.  Psychiatric/Behavioral:       Severe dementia. She has no behavioral issues. No hallucinations noted.       Objective:BP 130/70  Pulse 70  Temp(Src) 97.6 F (36.4 C)  Resp 20  Wt 108 lb (48.988 kg)  BMI 21.09 kg/m2    Physical Exam  Constitutional: No distress.  Thin. Frail.  Neck: Neck supple. No JVD present. No tracheal deviation present. No thyromegaly present.  Cardiovascular: Normal rate, regular rhythm and intact distal pulses.  Exam reveals friction rub. Exam reveals no gallop.   No murmur heard. Abdominal: Soft. Bowel sounds are normal. She exhibits no distension and no mass. There is no tenderness.  Musculoskeletal: She exhibits no edema and no tenderness.  Right hemiparesis  Lymphadenopathy:    She has no cervical adenopathy.  Neurological:  Right hemiparesis. Significant dementia. Answers some questions but communication is difficult.  Skin: No rash noted. No erythema. No pallor.  Psychiatric: She has a normal mood and affect. Her behavior is normal.          Assessment &  Plan:  Tremor: Etiology is uncertain. Patient currently is being treated with Namenda and rivastigmine. I will discontinue the rivastigmine. She will remain on Namenda XR 28 mg daily for the time being. I also have taken her off of atorvastatin. The likelihood of this drug benefiting her at her advanced age and dementia is minimal at best. Following episodes of the tremor or myoclonic jerks, there has been no evidence of a postictal phase. She does not lose consciousness. I not sure whether this is a drug related effect, a residual issue from her previous CVA, or simply myoclonic jerks.  Vascular dementia, without behavioral disturbance: Because her dementia is thought to be vascular in origin, an argument could be raised against using any of the dementia medications. I will leave her on Namenda for the present time. Consider stopping it in the future.  CVA, old, hemiparesis: No evidence for new stroke. No seizures noted.  FTT (failure to thrive) in adult: Continue to watch her weight and oral intake.

## 2012-10-26 ENCOUNTER — Telehealth: Payer: Self-pay | Admitting: Neurology

## 2012-10-26 NOTE — Telephone Encounter (Signed)
Pt has not seen Dr. Frances Furbish, prior Dr. Sandria Manly pt please reassign to another Dr. per Dr. Frances Furbish.

## 2012-10-26 NOTE — Telephone Encounter (Signed)
Pt has not been seen by Dr. Athar, prior Dr. Love pt needs to be reassigned per Dr. Athar °

## 2012-10-27 ENCOUNTER — Telehealth: Payer: Self-pay | Admitting: Neurology

## 2012-10-27 NOTE — Telephone Encounter (Signed)
Transfer of care Dr. Love needs to be reassigned per Dr. Athar °

## 2012-10-28 NOTE — Telephone Encounter (Signed)
Assigned to Dr. Pearlean Brownie.

## 2012-10-29 ENCOUNTER — Telehealth: Payer: Self-pay | Admitting: Neurology

## 2012-10-29 NOTE — Telephone Encounter (Signed)
Tracie [name], daughter of pt called would like for nurse to please contact her at [phone] pt is in nursing home also is having tremors for 2 wks now needs to be seen before Nov which is what I'm showing available for Dr. Pearlean Brownie. [name] did advise me that someone told her to have hospital call but I don't see where they called and she states she did call them to have them call here to set up something soon. Thanks

## 2012-11-02 NOTE — Telephone Encounter (Signed)
I called and LMVM for Tonya to call me back re: appt for pt.

## 2012-11-03 ENCOUNTER — Non-Acute Institutional Stay (SKILLED_NURSING_FACILITY): Payer: Medicare Other | Admitting: Nurse Practitioner

## 2012-11-03 ENCOUNTER — Encounter: Payer: Self-pay | Admitting: Nurse Practitioner

## 2012-11-03 DIAGNOSIS — R259 Unspecified abnormal involuntary movements: Secondary | ICD-10-CM

## 2012-11-03 DIAGNOSIS — F0150 Vascular dementia without behavioral disturbance: Secondary | ICD-10-CM

## 2012-11-03 DIAGNOSIS — E559 Vitamin D deficiency, unspecified: Secondary | ICD-10-CM

## 2012-11-03 DIAGNOSIS — R251 Tremor, unspecified: Secondary | ICD-10-CM

## 2012-11-03 NOTE — Progress Notes (Signed)
Patient ID: Tracie [name], female   DOB: 03-05-1929, 79 y.o.   MRN: [id number]  Nursing Home Location:  [facility]   Place of Service: SNF (31)  Chief Complaint  Patient presents with  . Medical Managment of Chronic Issues    HPI:  79 yo F with severe dementia and previous CVA with RT sided hemiparesis who is a resident of [facility] is being seen today for routine follow up. Pt doing well; getting up eating meals with the assistance of staff, smiles but is basically non-communititive. In the last month pt noted with tremors but staff said last episode was 2 weeks ago and more like whole body hiccups, as needed xanax given and helped. There have been no recurrent episodes. Staff without concerns. Review of Systems:  Unable due to dementia.   Medications: Patient's Medications  New Prescriptions   No medications on file  Previous Medications   ALPRAZOLAM (XANAX) 0.25 MG TABLET    Take 1 tablet (0.25 mg total) by mouth every 8 (eight) hours.   DIVALPROEX (DEPAKOTE) 125 MG DR TABLET    Take 125 mg by mouth daily.    FEEDING SUPPLEMENT (ENSURE) PUDG    Take 1 Container by mouth daily.   FEEDING SUPPLEMENT (GLUCERNA SHAKE) LIQD    Take 237 mLs by mouth 2 (two) times daily between meals.   MEMANTINE HCL ER 28 MG CP24    Take 28 mg by mouth daily. to help preserve memory.  Modified Medications   No medications on file  Discontinued Medications   No medications on file     Physical Exam:  Filed Vitals:   11/03/12 1549  BP: 110/54  Pulse: 55  Temp: 97 F (36.1 C)  Resp: 18    GENERAL APPEARANCE: Alert. No acute distress.  SKIN: No diaphoresis rash, or wounds HEAD: Normocephalic, atraumatic  EYES: Conjunctiva/lids clear. Pupils round, reactive. EOMs intact.  EARS: External exam WNL. Hearing grossly normal.  NOSE: No deformity or discharge.  MOUTH/THROAT: Lips w/o lesions. Mouth and throat normal. Tongue moist, w/o lesion.  NECK: No thyroid tenderness, enlargement  or nodule  RESPIRATORY: Breathing is even, unlabored. Lung sounds are clear   CARDIOVASCULAR: Heart RRR no murmurs, rubs or gallops. No peripheral edema.  ARTERIAL: radial pulse 2+ GASTROINTESTINAL: Abdomen is soft, non-tender, not distended w/ normal bowel sounds GENITOURINARY: Bladder non tender, not distended  NEUROLOGIC: right sided hemiparesis;  no tremor noted on exam. PSYCHIATRIC: Mood and affect appropriate to situation, no behavioral issues  Labs reviewed/Significant Diagnostic Results: Lipid Profile  Result: 09/30/2012 4:06 PM ( Status: F ) C  Cholesterol 168 0-200 mg/dL SLN C  Triglyceride 40 <150 mg/dL SLN  HDL Cholesterol 45 >39 mg/dL SLN  Total Chol/HDL Ratio 3.7 Ratio SLN  VLDL Cholesterol (Calc) 8 0-40 mg/dL SLN  LDL Cholesterol (Calc) 115 H 0-99 mg/dL SLN C  Liver Profile  Result: 09/30/2012 4:06 PM ( Status: F )  Bilirubin, Total 0.4 0.3-1.2 mg/dL SLN  Bilirubin, Direct 0.1 0.0-0.3 mg/dL SLN  Indirect Bilirubin 0.3 0.0-0.9 mg/dL SLN  Alkaline Phosphatase 106 39-117 U/L SLN  AST/SGOT 13 0-37 U/L SLN  ALT/SGPT 8 0-35 U/L SLN  Total Protein 5.8 L 6.0-8.3 g/dL SLN  Albumin 3.3  CBC with Diff  Result: 09/21/2012 1:40 PM ( Status: F )  WBC 5.4 4.0-10.5 K/uL SLN  RBC 3.73 L 4.22-5.81 MIL/uL SLN  Hemoglobin 9.4 L 13.0-17.0 g/dL SLN  Hematocrit 44.0 L 39.0-52.0 % SLN  MCV 79.1 78.0-100.0  fL SLN  MCH 25.2 L 26.0-34.0 pg SLN  MCHC 31.9 30.0-36.0 g/dL SLN  RDW 40.9 81.1-91.4 % SLN  Platelet Count 243 150-400 K/uL SLN  Granulocyte % 57 43-77 % SLN  Absolute Gran 3.0 1.7-7.7 K/uL SLN  Lymph % 34 12-46 % SLN  Absolute Lymph 1.8 0.7-4.0 K/uL SLN  Mono % 8 3-12 % SLN  Absolute Mono 0.5 0.1-1.0 K/uL SLN  Eos % 1 0-5 % SLN  Absolute Eos 0.1 0.0-0.7 K/uL SLN  Baso % 0 0-1 % SLN  Absolute Baso 0.0 0.0-0.1 K/uL SLN  Smear Review NOTE SLN C  Comprehensive Metabolic Panel  Result: 09/21/2012 1:05 PM ( Status: F )  Sodium 140 135-145 mEq/L SLN  Potassium 3.9 3.5-5.3 mEq/L SLN   Chloride 103 96-112 mEq/L SLN  CO2 27 19-32 mEq/L SLN  Glucose 98 70-99 mg/dL SLN  BUN 14 7-82 mg/dL SLN  Creatinine 9.56 2.13-0.86 mg/dL SLN  Bilirubin, Total 0.4 0.3-1.2 mg/dL SLN  Alkaline Phosphatase 100 39-117 U/L SLN  AST/SGOT 14 0-37 U/L SLN  ALT/SGPT <8 0-53 U/L SLN  Total Protein 5.8 L 6.0-8.3 g/dL SLN  Albumin 3.1 L 5.7-8.4 g/dL SLN  Calcium 8.3 L 6.9-62.9 mg/dL SLN  Hemoglobin B2W  Result: 09/21/2012 4:04 PM ( Status: F )  Hemoglobin A1C 6.1 H <5.7 % SLN C  Estimated Average Glucose 128 H <117 mg/dL SLN  Vitamin D (41-[id number])  Result: 09/22/2012 12:38 AM ( Status: F )  Vitamin D (25-Hydroxy) 35      Assessment/Plan 1. Vascular dementia Currently off aricept due to tremors; to cont namenda  2. Tremor To follow with neurology; currently without tremor at this time  3. Vit D def  vit D 2000 units daily

## 2012-11-03 NOTE — Telephone Encounter (Signed)
I spoke to daughter, [name], regarding the problem with pt.  She developed tremors within the last several weeks.   Has been given med?for them which helps but has had noted increase.  Appt in Nov. With Dr. Pearlean Brownie (transfer Love pt).  This is a new problem to assess.  Referral from [facility] and then can make appt. Will ask Dr. Pearlean Brownie re: seeing this pt.

## 2012-11-04 NOTE — Telephone Encounter (Signed)
I consulted Dr. Pearlean Brownie, new problem, tremors/jerking.   Did note in 10-05-12 ofv note, per Candelaria Celeste, NP.  Made appt 11-11-12 at 1300, be here 1245.  [name], daughter to be here also.  Pt resides at [facility].  New problem-jerking. Verbalized understanding.

## 2012-11-09 ENCOUNTER — Ambulatory Visit: Payer: Self-pay | Admitting: Neurology

## 2012-11-09 ENCOUNTER — Encounter: Payer: Self-pay | Admitting: Neurology

## 2012-11-11 ENCOUNTER — Ambulatory Visit (INDEPENDENT_AMBULATORY_CARE_PROVIDER_SITE_OTHER): Payer: Medicare Other | Admitting: Neurology

## 2012-11-11 ENCOUNTER — Other Ambulatory Visit: Payer: Self-pay | Admitting: *Deleted

## 2012-11-11 ENCOUNTER — Encounter: Payer: Self-pay | Admitting: Neurology

## 2012-11-11 VITALS — BP 132/86 | HR 75

## 2012-11-11 DIAGNOSIS — R251 Tremor, unspecified: Secondary | ICD-10-CM

## 2012-11-11 DIAGNOSIS — R259 Unspecified abnormal involuntary movements: Secondary | ICD-10-CM

## 2012-11-11 HISTORY — DX: Tremor, unspecified: R25.1

## 2012-11-11 NOTE — Patient Instructions (Addendum)
She was advised to discontinue Depakote and stay off the Exelon as the tremors seem a lot improved. Continue Namenda for dementia and aspirin for stroke prevention. She may return for followup in the future only as necessary .

## 2012-11-11 NOTE — Progress Notes (Signed)
[facility] [address] [place]. [place] [address] [phone]       OFFICE FOLLOW-UP NOTE  Ms. [name] Date of Birth:  [birth year] Medical Record Number:  [id number]   HPI:  36 year African American lady with advanced vascular dementia with history of remote hemorrhagic left hemispheric infarct in 2009 with residual significant spastic hemiplegia and aphasia. 11/11/2012 she is seen today upon request from family because of new complaints of right-sided tremors she had last month. On 10/16/12 Dr. Chilton Si discontinued Exelon which seems to have helped in the tremors are now intermittent and very noticeable. She was also on Lipitor which has been discontinued. The patient has advanced dementia and spends most of her time in a wheelchair. She is total care and is incontinent. She can barely speak a few words but cannot be understood. She needs help with all activities of daily living. She was previously walking with a walker until a year ago when she had confusion and fell and since then she's been a wheelchair. The patient's husband and daughter feel that there has not been any significant cognitive worsening after discontinuing Exelon. She is also on Depakote 1 from again daily which had been started a few years ago by Dr. Sandria Manly for agitation. The patient has not had any issues with agitation she'll for several months lab work on 10/21/12 shows normal vitamin B12, folate, TSH and electrolytes. Vitamin D is 31 injuries/mL CBC is normal. Liver enzymes are normal percussion was elevated at 6.2 but when repeated was down to 4.3 lipid profile dated 10/31/12 shows total cholesterol 171, triglycerides 54, HDL 44 and LDL 116 mg percent. ROS:   14 system review of systems is positive for  dementia, memory loss, confusion, weakness, gait difficulty PMH:  Past Medical History  Diagnosis Date  . Stroke   . Dementia   . Hypertension   . Laceration of skin of eyelid and periocular area   .  Other fall   . Wheelchair dependence   . Hypopotassemia   . Encounters for unspecified administrative purpose   . Other and unspecified hyperlipidemia   . Reflux esophagitis   . Blood in stool   . Routine general medical examination at a health care facility   . Hypopotassemia   . Dementia in conditions classified elsewhere with behavioral disturbance(294.11)   . Dementia in conditions classified elsewhere without behavioral disturbance(294.10)   . Loss of weight   . Cough   . Unspecified vitamin D deficiency   . Thrombocytopenia, unspecified   . Unspecified disorder of kidney and ureter   . Other and unspecified hyperlipidemia   . Senile dementia, uncomplicated   . Anxiety state, unspecified   . Unspecified essential hypertension   . Unspecified late effects of cerebrovascular disease   . Other malaise and fatigue   . Abnormality of gait     Social History:  History   Social History  . Marital Status: Married    Spouse Name: N/A    Number of Children: N/A  . Years of Education: N/A   Occupational History  . Not on file.   Social History Main Topics  . Smoking status: Never Smoker   . Smokeless tobacco: Never Used  . Alcohol Use: No  . Drug Use: No  . Sexual Activity: No   Other Topics Concern  . Not on file   Social History Narrative  . No narrative on file    Medications:   Current Outpatient Prescriptions  on File Prior to Visit  Medication Sig Dispense Refill  . ALPRAZolam (XANAX) 0.25 MG tablet Take 1 tablet (0.25 mg total) by mouth every 8 (eight) hours.  90 tablet  5  . divalproex (DEPAKOTE) 125 MG DR tablet Take 125 mg by mouth daily.       . feeding supplement (ENSURE) PUDG Take 1 Container by mouth daily.      . feeding supplement (GLUCERNA SHAKE) LIQD Take 237 mLs by mouth 2 (two) times daily between meals.      . Memantine HCl ER 28 MG CP24 Take 28 mg by mouth daily. to help preserve memory.  30 capsule  5   No current facility-administered  medications on file prior to visit.    Allergies:  No Known Allergies  Physical Exam General: Frail cachectic elderly African American lady sitting in a wheelchair,  n no evident distress Head: head normocephalic and atraumatic. Orohparynx benign Neck: supple with no carotid or supraclavicular bruits Cardiovascular: regular rate and rhythm, no murmurs Musculoskeletal: no deformity Skin:  no rash/petichiae Vascular:  Normal pulses all extremities Filed Vitals:   11/11/12 1302  BP: 132/86  Pulse: 75    Neurologic Exam Mental Status: Awake and fully alert. Severe global aphasia with expressive greater than receptive. Speaks a few words but not sentences. Follows only simple occasional midline commands. Not able to name or repeat.  Cranial Nerves: Fundoscopic exam not done   Pupils equal, briskly reactive to light. Extraocular movements full without nystagmus. Visual fields blinks to threat bilaterally   Hearing intact. Facial sensation intact. Face, tongue, palate moves normally and symmetrically.  Motor: Right hemiplegia with grade 2/5 strength in the right upper and lower extremity. Right hand is in a brace. Tone is increased on the right side with spasticity. Normal strength on the left side. Sensory.: Diminished on the right hemibody  Coordination: Rapid alternating movements normal in all extremities. Finger-to-nose and heel-to-shin performed accurately bilaterally. Gait and Station: Patient sitting in a wheelchair and unable to get up even with assistance or walk Reflexes: 1+  on left and 3+ on the right  . Toes downgoing.     ASSESSMENT: 32 year African American lady with advanced vascular dementia with history of remote hemorrhagic left hemispheric infarct in 2009 with residual significant spastic hemiplegia and aphasia. Recent new-onset right-sided tremors which seem to have improved after discontinuation of Exelon.    PLAN: I had a long discussion with the patient's husband  and daughter regarding her condition, prognosis and answered questions She was advised to discontinue Depakote and stay off the Exelon as the tremors seem a lot improved. Continue Namenda for dementia and aspirin for stroke prevention. Dr. Chilton Si consider restarting Lipitor 2 elevated LDL and history of stroke She may return for followup in the future only as necessary .

## 2012-12-01 ENCOUNTER — Non-Acute Institutional Stay (SKILLED_NURSING_FACILITY): Payer: Medicare Other | Admitting: Nurse Practitioner

## 2012-12-01 DIAGNOSIS — R251 Tremor, unspecified: Secondary | ICD-10-CM

## 2012-12-01 DIAGNOSIS — E785 Hyperlipidemia, unspecified: Secondary | ICD-10-CM

## 2012-12-01 DIAGNOSIS — I69959 Hemiplegia and hemiparesis following unspecified cerebrovascular disease affecting unspecified side: Secondary | ICD-10-CM

## 2012-12-01 DIAGNOSIS — R259 Unspecified abnormal involuntary movements: Secondary | ICD-10-CM

## 2012-12-01 DIAGNOSIS — F0150 Vascular dementia without behavioral disturbance: Secondary | ICD-10-CM

## 2012-12-01 DIAGNOSIS — I69359 Hemiplegia and hemiparesis following cerebral infarction affecting unspecified side: Secondary | ICD-10-CM

## 2012-12-01 NOTE — Progress Notes (Signed)
Patient ID: Tracie [name], female   DOB: [birth year], 79 y.o.   MRN: [id number]  Nursing Home Location:  [facility]   Place of Service: SNF (31)  Chief Complaint  Patient presents with  . Medical Managment of Chronic Issues    HPI:  79 yo F with advanced dementia and previous CVA with RT sided hemiparesis who is a resident of [facility] is being seen today for routine follow up. Pt doing well; getting up eating meals with the assistance of staff, smiles but is basically non-communititive. Staff without concerns for todays visit Pt had neurology consult who feels like there may have been a relationship between decrease in the tremor and stopping exelon patch and also recommends stopping the Depakote which was started due to behaviors    Review of Systems:  Unable to obtain due to dementia   Medications: Patient's Medications  New Prescriptions   No medications on file  Previous Medications   ALPRAZOLAM (XANAX) 0.25 MG TABLET    Take 1 tablet (0.25 mg total) by mouth every 8 (eight) hours.   ASPIRIN 81 MG CHEWABLE TABLET    Chew 81 mg by mouth daily.   CHOLECALCIFEROL 2000 UNITS TABS    Take 2,000 tablets by mouth daily.   DIVALPROEX (DEPAKOTE) 125 MG DR TABLET    Take 125 mg by mouth daily.    FEEDING SUPPLEMENT (ENSURE) PUDG    Take 1 Container by mouth daily.   FEEDING SUPPLEMENT (GLUCERNA SHAKE) LIQD    Take 237 mLs by mouth 2 (two) times daily between meals.   MAGNESIUM HYDROXIDE (MILK OF MAGNESIA) 800 MG/5ML SUSPENSION    Take 30 mLs by mouth daily as needed for constipation.   MEMANTINE HCL ER 28 MG CP24    Take 28 mg by mouth daily. to help preserve memory.   SODIUM PHOSPHATES (RA SALINE ENEMA RE)    Place rectally as needed.  Modified Medications   No medications on file  Discontinued Medications   No medications on file     Physical Exam:  Filed Vitals:   12/01/12 1654  BP: 132/70  Pulse: 70  Temp: 97.7 F (36.5 C)  Resp: 18  Weight: 108 lb 9.6 oz  (49.261 kg)    GENERAL APPEARANCE: Alert. No acute distress.  SKIN: No diaphoresis rash, or wounds  HEAD: Normocephalic, atraumatic  EYES: Conjunctiva/lids clear. Pupils round, reactive. EOMs intact.  EARS: External exam WNL. Hearing grossly normal.  NOSE: No deformity or discharge.  MOUTH/THROAT: Lips w/o lesions. Mouth and throat normal. Tongue moist, w/o lesion.  NECK: No thyroid tenderness, enlargement or nodule  RESPIRATORY: Breathing is even, unlabored. Lung sounds are clear  CARDIOVASCULAR: Heart RRR no murmurs, rubs or gallops. No peripheral edema.  ARTERIAL: radial pulse 2+  GASTROINTESTINAL: Abdomen is soft, non-tender, not distended w/ normal bowel sounds  GENITOURINARY: Bladder non tender, not distended  NEUROLOGIC: right sided hemiparesis; no tremor noted on exam.  PSYCHIATRIC: no behavioral issues      Labs reviewed/Significant Diagnostic Results: Lipid Profile  Result: 09/30/2012 4:06 PM ( Status: F ) C  Cholesterol 168 0-200 mg/dL SLN C  Triglyceride 40 <150 mg/dL SLN  HDL Cholesterol 45 >39 mg/dL SLN  Total Chol/HDL Ratio 3.7 Ratio SLN  VLDL Cholesterol (Calc) 8 0-40 mg/dL SLN  LDL Cholesterol (Calc) 115 H 0-99 mg/dL SLN C  Liver Profile  Result: 09/30/2012 4:06 PM ( Status: F )  Bilirubin, Total 0.4 0.3-1.2 mg/dL SLN  Bilirubin, Direct 0.1  0.0-0.3 mg/dL SLN  Indirect Bilirubin 0.3 0.0-0.9 mg/dL SLN  Alkaline Phosphatase 106 39-117 U/L SLN  AST/SGOT 13 0-37 U/L SLN  ALT/SGPT 8 0-35 U/L SLN  Total Protein 5.8 L 6.0-8.3 g/dL SLN  Albumin 3.3  CBC with Diff  Result: 09/21/2012 1:40 PM ( Status: F )  WBC 5.4 4.0-10.5 K/uL SLN  RBC 3.73 L 4.22-5.81 MIL/uL SLN  Hemoglobin 9.4 L 13.0-17.0 g/dL SLN  Hematocrit 28.4 L 39.0-52.0 % SLN  MCV 79.1 78.0-100.0 fL SLN  MCH 25.2 L 26.0-34.0 pg SLN  MCHC 31.9 30.0-36.0 g/dL SLN  RDW 13.2 44.0-10.2 % SLN  Platelet Count 243 150-400 K/uL SLN  Granulocyte % 57 43-77 % SLN  Absolute Gran 3.0 1.7-7.7 K/uL SLN  Lymph % 34  12-46 % SLN  Absolute Lymph 1.8 0.7-4.0 K/uL SLN  Mono % 8 3-12 % SLN  Absolute Mono 0.5 0.1-1.0 K/uL SLN  Eos % 1 0-5 % SLN  Absolute Eos 0.1 0.0-0.7 K/uL SLN  Baso % 0 0-1 % SLN  Absolute Baso 0.0 0.0-0.1 K/uL SLN  Smear Review NOTE SLN C  Comprehensive Metabolic Panel  Result: 09/21/2012 1:05 PM ( Status: F )  Sodium 140 135-145 mEq/L SLN  Potassium 3.9 3.5-5.3 mEq/L SLN  Chloride 103 96-112 mEq/L SLN  CO2 27 19-32 mEq/L SLN  Glucose 98 70-99 mg/dL SLN  BUN 14 7-25 mg/dL SLN  Creatinine 3.66 4.40-3.47 mg/dL SLN  Bilirubin, Total 0.4 0.3-1.2 mg/dL SLN  Alkaline Phosphatase 100 39-117 U/L SLN  AST/SGOT 14 0-37 U/L SLN  ALT/SGPT <8 0-53 U/L SLN  Total Protein 5.8 L 6.0-8.3 g/dL SLN  Albumin 3.1 L 4.2-5.9 g/dL SLN  Calcium 8.3 L 5.6-38.7 mg/dL SLN  Hemoglobin F6E  Result: 09/21/2012 4:04 PM ( Status: F )  Hemoglobin A1C 6.1 H <5.7 % SLN C  Estimated Average Glucose 128 H <117 mg/dL SLN  Vitamin D (33-IRJJOAC)  Result: 09/22/2012 12:38 AM ( Status: F )  Vitamin D (25-Hydroxy) 35    Lipid Profile       Result: 12/01/2012 4:26 PM    ( Status: F )       C     Cholesterol  179        0-200  mg/dL  SLN  C     Triglyceride  42        <150  mg/dL  SLN       HDL Cholesterol  51        >39  mg/dL  SLN       Total Chol/HDL Ratio  3.5         Ratio  SLN       VLDL Cholesterol (Calc)  8        0-40  mg/dL  SLN       LDL Cholesterol (Calc)  120     H  0-99  mg/dL  SLN  C    Liver Profile       Result: 12/01/2012 4:26 PM    ( Status: F )            Bilirubin, Total  0.4        0.3-1.2  mg/dL  SLN       Bilirubin, Direct  0.1        0.0-0.3  mg/dL  SLN       Indirect Bilirubin  0.3        0.0-0.9  mg/dL  SLN       Alkaline  Phosphatase  80        39-117  U/L  SLN       AST/SGOT  11        0-37  U/L  SLN       ALT/SGPT  10        0-35  U/L  SLN       Total Protein  5.6     L  6.0-8.3  g/dL  SLN       Albumin  3.3     L  3.5-5.2  g/dL  SLN     CBC with Diff       Result: 11/14/2012 6:02 PM     ( Status: F )            WBC  5.2        4.0-10.5  K/uL  SLN       RBC  4.00        3.87-5.11  MIL/uL  SLN       Hemoglobin  10.6     L  12.0-15.0  g/dL  SLN       Hematocrit  32.8     L  36.0-46.0  %  SLN       MCV  82.0        78.0-100.0  fL  SLN       MCH  26.5        26.0-34.0  pg  SLN       MCHC  32.3        30.0-36.0  g/dL  SLN       RDW  16.1        11.5-15.5  %  SLN       Platelet Count  155        150-400  K/uL  SLN       Granulocyte %  62        43-77  %  SLN       Absolute Gran  3.2        1.7-7.7  K/uL  SLN       Lymph %  31        12-46  %  SLN       Absolute Lymph  1.6        0.7-4.0  K/uL  SLN       Mono %  6        3-12  %  SLN       Absolute Mono  0.3        0.1-1.0  K/uL  SLN       Eos %  1        0-5  %  SLN       Absolute Eos  0.0        0.0-0.7  K/uL  SLN       Baso %  0        0-1  %  SLN       Absolute Baso  0.0        0.0-0.1  K/uL  SLN       Smear Review     Assessment/Plan 1. Tremor Improved will stop Depakote per neurology recs  2. CVA, old, hemiparesis Stable at this time  3. Vascular dementia Advanced; will cont namenda  4. Other and unspecified hyperlipidemia Unchanged; pt off all medications at this time.

## 2012-12-13 DIAGNOSIS — E785 Hyperlipidemia, unspecified: Secondary | ICD-10-CM | POA: Insufficient documentation

## 2012-12-13 DIAGNOSIS — F0150 Vascular dementia without behavioral disturbance: Secondary | ICD-10-CM | POA: Insufficient documentation

## 2013-01-04 ENCOUNTER — Non-Acute Institutional Stay (SKILLED_NURSING_FACILITY): Payer: Medicare Other | Admitting: Nurse Practitioner

## 2013-01-04 DIAGNOSIS — I69359 Hemiplegia and hemiparesis following cerebral infarction affecting unspecified side: Secondary | ICD-10-CM

## 2013-01-04 DIAGNOSIS — R627 Adult failure to thrive: Secondary | ICD-10-CM

## 2013-01-04 DIAGNOSIS — F0150 Vascular dementia without behavioral disturbance: Secondary | ICD-10-CM

## 2013-01-04 DIAGNOSIS — R251 Tremor, unspecified: Secondary | ICD-10-CM

## 2013-01-04 DIAGNOSIS — I69959 Hemiplegia and hemiparesis following unspecified cerebrovascular disease affecting unspecified side: Secondary | ICD-10-CM

## 2013-01-04 DIAGNOSIS — R259 Unspecified abnormal involuntary movements: Secondary | ICD-10-CM

## 2013-01-04 NOTE — Progress Notes (Signed)
Patient ID: Tracie [name], female   DOB: January 01, 1929, 79 y.o.   MRN: [id number]   PCP: Kimber Relic, MD   No Known Allergies  Chief Complaint  Patient presents with  . Medical Managment of Chronic Issues    HPI:  79 yo F with advanced dementia and previous CVA with RT sided hemiparesis who is a resident of [facility] is being seen today for routine follow up. Pt doing well; getting up eating meals with the assistance of staff, smiles but is basically non-communititive. Staff without concerns for todays visit and there has been no changes in the last month. Review of Systems:  Review of Systems  Unable to perform ROS: dementia     Past Medical History  Diagnosis Date  . Stroke   . Dementia   . Hypertension   . Laceration of skin of eyelid and periocular area   . Other fall   . Wheelchair dependence   . Hypopotassemia   . Encounters for unspecified administrative purpose   . Other and unspecified hyperlipidemia   . Reflux esophagitis   . Blood in stool   . Routine general medical examination at a health care facility   . Hypopotassemia   . Dementia in conditions classified elsewhere with behavioral disturbance(294.11)   . Dementia in conditions classified elsewhere without behavioral disturbance(294.10)   . Loss of weight   . Cough   . Unspecified vitamin D deficiency   . Thrombocytopenia, unspecified   . Unspecified disorder of kidney and ureter   . Other and unspecified hyperlipidemia   . Senile dementia, uncomplicated   . Anxiety state, unspecified   . Unspecified essential hypertension   . Unspecified late effects of cerebrovascular disease   . Other malaise and fatigue   . Abnormality of gait    Past Surgical History  Procedure Laterality Date  . Joint replacement  2008    Dr Renae Fickle  . Abdominal hysterectomy  1981   Social History:   reports that she has never smoked. She has never used smokeless tobacco. She reports that she does not drink alcohol or use  illicit drugs.  No family history on file.  Medications: Patient's Medications  New Prescriptions   No medications on file  Previous Medications   ALPRAZOLAM (XANAX) 0.25 MG TABLET    Take 1 tablet (0.25 mg total) by mouth every 8 (eight) hours.   ASPIRIN 81 MG CHEWABLE TABLET    Chew 81 mg by mouth daily.   CHOLECALCIFEROL 2000 UNITS TABS    Take 2,000 tablets by mouth daily.           FEEDING SUPPLEMENT (ENSURE) PUDG    Take 1 Container by mouth daily.   FEEDING SUPPLEMENT (GLUCERNA SHAKE) LIQD    Take 237 mLs by mouth 2 (two) times daily between meals.   MAGNESIUM HYDROXIDE (MILK OF MAGNESIA) 800 MG/5ML SUSPENSION    Take 30 mLs by mouth daily as needed for constipation.   MEMANTINE HCL ER 28 MG CP24    Take 28 mg by mouth daily. to help preserve memory.   SODIUM PHOSPHATES (RA SALINE ENEMA RE)    Place rectally as needed.  Modified Medications   No medications on file  Discontinued Medications   No medications on file     Physical Exam: GENERAL APPEARANCE: Alert. No acute distress.  SKIN: No diaphoresis rash, or wounds  HEAD: Normocephalic, atraumatic  EYES: Conjunctiva/lids clear. Pupils round, reactive. EOMs intact.  EARS: External exam WNL. Hearing  grossly normal.  NOSE: No deformity or discharge.  MOUTH/THROAT: Lips w/o lesions. Mouth and throat normal. Tongue moist, w/o lesion.  NECK: No thyroid tenderness, enlargement or nodule  RESPIRATORY: Breathing is even, unlabored. Lung sounds are clear  CARDIOVASCULAR: Heart RRR no murmurs, rubs or gallops. No peripheral edema.  ARTERIAL: radial pulse 2+  GASTROINTESTINAL: Abdomen is soft, non-tender, not distended w/ normal bowel sounds  GENITOURINARY: Bladder non tender, not distended  NEUROLOGIC: right sided hemiparesis; no tremor noted on exam.  PSYCHIATRIC: no behavioral issues     Filed Vitals:   01/04/13 1940  BP: 122/58  Pulse: 61  Temp: 97.8 F (36.6 C)  Resp: 20  Weight: 112 lb (50.803 kg)      Labs  reviewed: Basic Metabolic Panel:  Recent Labs  16/10/96 0605 07/13/12 1555 07/14/12 0700  NA 155* 144 143  K 3.4* 4.5 3.7  CL 119* 108 107  CO2 27 26 28   GLUCOSE 140* 176* 153*  BUN 18 17 13   CREATININE 0.70 0.68 0.62  CALCIUM 8.5 8.5 8.5   Liver Function Tests:  Recent Labs  07/08/12 1757 07/12/12 2137  AST 19 17  ALT 13 14  ALKPHOS 130* 108  BILITOT 0.5 0.4  PROT 6.4 6.6  ALBUMIN  --  2.9*   No results found for this basename: LIPASE, AMYLASE,  in the last 8760 hours No results found for this basename: AMMONIA,  in the last 8760 hours CBC:  Recent Labs  05/06/12 0900 07/08/12 1757 07/12/12 2137 07/13/12 0605 07/14/12 0700  WBC 5.8 8.5 10.9* 9.1 9.1  NEUTROABS  --  5.8 8.5*  --   --   HGB 10.7* 13.0 12.3 10.6* 10.6*  HCT 31.8* 41.3 38.1 33.4* 32.2*  MCV 85.0 89 89.9 88.8 86.6  PLT 137*  --  124* 129* 140*   Cardiac Enzymes: No results found for this basename: CKTOTAL, CKMB, CKMBINDEX, TROPONINI,  in the last 8760 hours BNP: No components found with this basename: POCBNP,  CBG:  Recent Labs  07/15/12 0806 07/15/12 1130 07/15/12 1717  GLUCAP 118* 151* 124*   Lipid Profile  Result: 09/30/2012 4:06 PM ( Status: F ) C  Cholesterol 168 0-200 mg/dL SLN C  Triglyceride 40 <150 mg/dL SLN  HDL Cholesterol 45 >39 mg/dL SLN  Total Chol/HDL Ratio 3.7 Ratio SLN  VLDL Cholesterol (Calc) 8 0-40 mg/dL SLN  LDL Cholesterol (Calc) 115 H 0-99 mg/dL SLN C  Liver Profile  Result: 09/30/2012 4:06 PM ( Status: F )  Bilirubin, Total 0.4 0.3-1.2 mg/dL SLN  Bilirubin, Direct 0.1 0.0-0.3 mg/dL SLN  Indirect Bilirubin 0.3 0.0-0.9 mg/dL SLN  Alkaline Phosphatase 106 39-117 U/L SLN  AST/SGOT 13 0-37 U/L SLN  ALT/SGPT 8 0-35 U/L SLN  Total Protein 5.8 L 6.0-8.3 g/dL SLN  Albumin 3.3  CBC with Diff  Result: 09/21/2012 1:40 PM ( Status: F )  WBC 5.4 4.0-10.5 K/uL SLN  RBC 3.73 L 4.22-5.81 MIL/uL SLN  Hemoglobin 9.4 L 13.0-17.0 g/dL SLN  Hematocrit 04.5 L 39.0-52.0 % SLN   MCV 79.1 78.0-100.0 fL SLN  MCH 25.2 L 26.0-34.0 pg SLN  MCHC 31.9 30.0-36.0 g/dL SLN  RDW 40.9 81.1-91.4 % SLN  Platelet Count 243 150-400 K/uL SLN  Granulocyte % 57 43-77 % SLN  Absolute Gran 3.0 1.7-7.7 K/uL SLN  Lymph % 34 12-46 % SLN  Absolute Lymph 1.8 0.7-4.0 K/uL SLN  Mono % 8 3-12 % SLN  Absolute Mono 0.5 0.1-1.0 K/uL SLN  Eos %  1 0-5 % SLN  Absolute Eos 0.1 0.0-0.7 K/uL SLN  Baso % 0 0-1 % SLN  Absolute Baso 0.0 0.0-0.1 K/uL SLN  Smear Review NOTE SLN C  Comprehensive Metabolic Panel  Result: 09/21/2012 1:05 PM ( Status: F )  Sodium 140 135-145 mEq/L SLN  Potassium 3.9 3.5-5.3 mEq/L SLN  Chloride 103 96-112 mEq/L SLN  CO2 27 19-32 mEq/L SLN  Glucose 98 70-99 mg/dL SLN  BUN 14 1-61 mg/dL SLN  Creatinine 0.96 0.45-4.09 mg/dL SLN  Bilirubin, Total 0.4 0.3-1.2 mg/dL SLN  Alkaline Phosphatase 100 39-117 U/L SLN  AST/SGOT 14 0-37 U/L SLN  ALT/SGPT <8 0-53 U/L SLN  Total Protein 5.8 L 6.0-8.3 g/dL SLN  Albumin 3.1 L 8.1-1.9 g/dL SLN  Calcium 8.3 L 1.4-78.2 mg/dL SLN  Hemoglobin N5A  Result: 09/21/2012 4:04 PM ( Status: F )  Hemoglobin A1C 6.1 H <5.7 % SLN C  Estimated Average Glucose 128 H <117 mg/dL SLN  Vitamin D (21-HYQMVHQ)  Result: 09/22/2012 12:38 AM ( Status: F )  Vitamin D (25-Hydroxy) 35    Lipid Profile  Result: 12/01/2012 4:26 PM ( Status: F ) C  Cholesterol 179 0-200 mg/dL SLN C  Triglyceride 42 <150 mg/dL SLN  HDL Cholesterol 51 >39 mg/dL SLN  Total Chol/HDL Ratio 3.5 Ratio SLN  VLDL Cholesterol (Calc) 8 0-40 mg/dL SLN  LDL Cholesterol (Calc) 120 H 0-99 mg/dL SLN C  Liver Profile  Result: 12/01/2012 4:26 PM ( Status: F )  Bilirubin, Total 0.4 0.3-1.2 mg/dL SLN  Bilirubin, Direct 0.1 0.0-0.3 mg/dL SLN  Indirect Bilirubin 0.3 0.0-0.9 mg/dL SLN  Alkaline Phosphatase 80 39-117 U/L SLN  AST/SGOT 11 0-37 U/L SLN  ALT/SGPT 10 0-35 U/L SLN  Total Protein 5.6 L 6.0-8.3 g/dL SLN  Albumin 3.3 L 4.6-9.6 g/dL SLN  CBC with Diff  Result: 11/14/2012 6:02 PM (  Status: F )  WBC 5.2 4.0-10.5 K/uL SLN  RBC 4.00 3.87-5.11 MIL/uL SLN  Hemoglobin 10.6 L 12.0-15.0 g/dL SLN  Hematocrit 29.5 L 36.0-46.0 % SLN  MCV 82.0 78.0-100.0 fL SLN  MCH 26.5 26.0-34.0 pg SLN  MCHC 32.3 30.0-36.0 g/dL SLN  RDW 28.4 13.2-44.0 % SLN  Platelet Count 155 150-400 K/uL SLN  Granulocyte % 62 43-77 % SLN  Absolute Gran 3.2 1.7-7.7 K/uL SLN  Lymph % 31 12-46 % SLN  Absolute Lymph 1.6 0.7-4.0 K/uL SLN  Mono % 6 3-12 % SLN  Absolute Mono 0.3 0.1-1.0 K/uL SLN  Eos % 1 0-5 % SLN  Absolute Eos 0.0 0.0-0.7 K/uL SLN  Baso % 0 0-1 % SLN  Absolute Baso 0.0 0.0-0.1 K/uL SLN  Smear Review    Assessment/Plan 1. Dementia, vascular Stable; will cont namenda  2. CVA, old, hemiparesis Stable on current medications; no worsening of symptoms  3. Tremors of nervous system Tremors stable  4. FTT (failure to thrive) in adult Pt doing well in current setting conts with weight gain from last month.

## 2013-01-26 ENCOUNTER — Non-Acute Institutional Stay (SKILLED_NURSING_FACILITY): Payer: Medicare Other | Admitting: Nurse Practitioner

## 2013-01-26 DIAGNOSIS — F0150 Vascular dementia without behavioral disturbance: Secondary | ICD-10-CM

## 2013-01-26 DIAGNOSIS — E785 Hyperlipidemia, unspecified: Secondary | ICD-10-CM

## 2013-01-26 DIAGNOSIS — I69959 Hemiplegia and hemiparesis following unspecified cerebrovascular disease affecting unspecified side: Secondary | ICD-10-CM

## 2013-01-26 DIAGNOSIS — R259 Unspecified abnormal involuntary movements: Secondary | ICD-10-CM

## 2013-01-26 DIAGNOSIS — R251 Tremor, unspecified: Secondary | ICD-10-CM

## 2013-01-26 DIAGNOSIS — I69359 Hemiplegia and hemiparesis following cerebral infarction affecting unspecified side: Secondary | ICD-10-CM

## 2013-01-26 NOTE — Progress Notes (Signed)
Patient ID: Tracie [name], female   DOB: October 26, 1928, 79 y.o.   MRN: [id number] Nursing Home Location:  [facility]   Place of Service: SNF (31)  PCP: Kimber Relic, MD  Code Status: DNR  No Known Allergies  Chief Complaint  Patient presents with  . Medical Managment of Chronic Issues    HPI:  79 yo female with pmh of advanced dementia, CVA with RT sided hemiparesis, htn who is a resident of [facility] is being seen today for routine follow up. Pt doing well and without change in the last month; pt noncommunicating but smiles when talked to.  Staff without concerns. Review of Systems:  Unable to perform ROS due to dementia  Past Medical History  Diagnosis Date  . Stroke   . Dementia   . Hypertension   . Laceration of skin of eyelid and periocular area   . Other fall   . Wheelchair dependence   . Hypopotassemia   . Encounters for unspecified administrative purpose   . Other and unspecified hyperlipidemia   . Reflux esophagitis   . Blood in stool   . Routine general medical examination at a health care facility   . Hypopotassemia   . Dementia in conditions classified elsewhere with behavioral disturbance(294.11)   . Dementia in conditions classified elsewhere without behavioral disturbance(294.10)   . Loss of weight   . Cough   . Unspecified vitamin D deficiency   . Thrombocytopenia, unspecified   . Unspecified disorder of kidney and ureter   . Other and unspecified hyperlipidemia   . Senile dementia, uncomplicated   . Anxiety state, unspecified   . Unspecified essential hypertension   . Unspecified late effects of cerebrovascular disease   . Other malaise and fatigue   . Abnormality of gait    Past Surgical History  Procedure Laterality Date  . Joint replacement  2008    Dr Renae Fickle  . Abdominal hysterectomy  1981   Social History:   reports that she has never smoked. She has never used smokeless tobacco. She reports that she does not drink alcohol or  use illicit drugs.  No family history on file.  Medications: Patient's Medications  New Prescriptions   No medications on file  Previous Medications   ALPRAZOLAM (XANAX) 0.25 MG TABLET    Take 1 tablet (0.25 mg total) by mouth every 8 (eight) hours.   ASPIRIN 81 MG CHEWABLE TABLET    Chew 81 mg by mouth daily.   CHOLECALCIFEROL 2000 UNITS TABS    Take 2,000 tablets by mouth daily.   FEEDING SUPPLEMENT (ENSURE) PUDG    Take 1 Container by mouth daily.   FEEDING SUPPLEMENT (GLUCERNA SHAKE) LIQD    Take 237 mLs by mouth 2 (two) times daily between meals.   MAGNESIUM HYDROXIDE (MILK OF MAGNESIA) 800 MG/5ML SUSPENSION    Take 30 mLs by mouth daily as needed for constipation.   MEMANTINE HCL ER 28 MG CP24    Take 28 mg by mouth daily. to help preserve memory.   SODIUM PHOSPHATES (RA SALINE ENEMA RE)    Place rectally as needed.  Modified Medications   No medications on file  Discontinued Medications   DIVALPROEX (DEPAKOTE) 125 MG DR TABLET    Take 125 mg by mouth daily.      Physical Exam:  Filed Vitals:   01/26/13 1113  BP: 130/70  Pulse: 72  Temp: 97.1 F (36.2 C)  Resp: 20  Weight: 112 lb (50.803  kg)   Physical Exam:  GENERAL APPEARANCE: Alert. No acute distress.  SKIN: No diaphoresis rash, or wounds  HEENT: unremarkable NECK: No thyroid tenderness, enlargement or nodule  RESPIRATORY: Breathing is even, unlabored. Lung sounds are clear  CARDIOVASCULAR: Heart RRR no murmurs, rubs or gallops. No peripheral edema.  ARTERIAL: radial pulse 2+  GASTROINTESTINAL: Abdomen is soft, non-tender, not distended w/ normal bowel sounds  GENITOURINARY: Bladder non tender, not distended  NEUROLOGIC: right sided hemiparesis; no tremor noted on exam or per staff.  PSYCHIATRIC: no behavioral issues     Labs reviewed: Basic Metabolic Panel:  Recent Labs  16/10/96 0605 07/13/12 1555 07/14/12 0700  NA 155* 144 143  K 3.4* 4.5 3.7  CL 119* 108 107  CO2 27 26 28   GLUCOSE 140* 176*  153*  BUN 18 17 13   CREATININE 0.70 0.68 0.62  CALCIUM 8.5 8.5 8.5   Liver Function Tests:  Recent Labs  07/08/12 1757 07/12/12 2137  AST 19 17  ALT 13 14  ALKPHOS 130* 108  BILITOT 0.5 0.4  PROT 6.4 6.6  ALBUMIN  --  2.9*   No results found for this basename: LIPASE, AMYLASE,  in the last 8760 hours No results found for this basename: AMMONIA,  in the last 8760 hours CBC:  Recent Labs  05/06/12 0900 07/08/12 1757 07/12/12 2137 07/13/12 0605 07/14/12 0700  WBC 5.8 8.5 10.9* 9.1 9.1  NEUTROABS  --  5.8 8.5*  --   --   HGB 10.7* 13.0 12.3 10.6* 10.6*  HCT 31.8* 41.3 38.1 33.4* 32.2*  MCV 85.0 89 89.9 88.8 86.6  PLT 137*  --  124* 129* 140*   Cardiac Enzymes: No results found for this basename: CKTOTAL, CKMB, CKMBINDEX, TROPONINI,  in the last 8760 hours BNP: No components found with this basename: POCBNP,  CBG:  Recent Labs  07/15/12 0806 07/15/12 1130 07/15/12 1717  GLUCAP 118* 151* 124*   TSH: No results found for this basename: TSH,  in the last 8760 hours A1C: Lab Results  Component Value Date   HGBA1C 6.5* 07/13/2012   Lipid Profile  Result: 09/30/2012 4:06 PM ( Status: F ) C  Cholesterol 168 0-200 mg/dL SLN C  Triglyceride 40 <150 mg/dL SLN  HDL Cholesterol 45 >39 mg/dL SLN  Total Chol/HDL Ratio 3.7 Ratio SLN  VLDL Cholesterol (Calc) 8 0-40 mg/dL SLN  LDL Cholesterol (Calc) 115 H 0-99 mg/dL SLN C  Liver Profile  Result: 09/30/2012 4:06 PM ( Status: F )  Bilirubin, Total 0.4 0.3-1.2 mg/dL SLN  Bilirubin, Direct 0.1 0.0-0.3 mg/dL SLN  Indirect Bilirubin 0.3 0.0-0.9 mg/dL SLN  Alkaline Phosphatase 106 39-117 U/L SLN  AST/SGOT 13 0-37 U/L SLN  ALT/SGPT 8 0-35 U/L SLN  Total Protein 5.8 L 6.0-8.3 g/dL SLN  Albumin 3.3  CBC with Diff  Result: 09/21/2012 1:40 PM ( Status: F )  WBC 5.4 4.0-10.5 K/uL SLN  RBC 3.73 L 4.22-5.81 MIL/uL SLN  Hemoglobin 9.4 L 13.0-17.0 g/dL SLN  Hematocrit 04.5 L 39.0-52.0 % SLN  MCV 79.1 78.0-100.0 fL SLN  MCH 25.2 L  26.0-34.0 pg SLN  MCHC 31.9 30.0-36.0 g/dL SLN  RDW 40.9 81.1-91.4 % SLN  Platelet Count 243 150-400 K/uL SLN  Granulocyte % 57 43-77 % SLN  Absolute Gran 3.0 1.7-7.7 K/uL SLN  Lymph % 34 12-46 % SLN  Absolute Lymph 1.8 0.7-4.0 K/uL SLN  Mono % 8 3-12 % SLN  Absolute Mono 0.5 0.1-1.0 K/uL SLN  Eos % 1  0-5 % SLN  Absolute Eos 0.1 0.0-0.7 K/uL SLN  Baso % 0 0-1 % SLN  Absolute Baso 0.0 0.0-0.1 K/uL SLN  Smear Review NOTE SLN C  Comprehensive Metabolic Panel  Result: 09/21/2012 1:05 PM ( Status: F )  Sodium 140 135-145 mEq/L SLN  Potassium 3.9 3.5-5.3 mEq/L SLN  Chloride 103 96-112 mEq/L SLN  CO2 27 19-32 mEq/L SLN  Glucose 98 70-99 mg/dL SLN  BUN 14 4-09 mg/dL SLN  Creatinine 8.11 9.14-7.82 mg/dL SLN  Bilirubin, Total 0.4 0.3-1.2 mg/dL SLN  Alkaline Phosphatase 100 39-117 U/L SLN  AST/SGOT 14 0-37 U/L SLN  ALT/SGPT <8 0-53 U/L SLN  Total Protein 5.8 L 6.0-8.3 g/dL SLN  Albumin 3.1 L 9.5-6.2 g/dL SLN  Calcium 8.3 L 1.3-08.6 mg/dL SLN  Hemoglobin V7Q  Result: 09/21/2012 4:04 PM ( Status: F )  Hemoglobin A1C 6.1 H <5.7 % SLN C  Estimated Average Glucose 128 H <117 mg/dL SLN  Vitamin D ([id number])  Result: 09/22/2012 12:38 AM ( Status: F )  Vitamin D (25-Hydroxy) 35    Lipid Profile  Result: 12/01/2012 4:26 PM ( Status: F ) C  Cholesterol 179 0-200 mg/dL SLN C  Triglyceride 42 <150 mg/dL SLN  HDL Cholesterol 51 >39 mg/dL SLN  Total Chol/HDL Ratio 3.5 Ratio SLN  VLDL Cholesterol (Calc) 8 0-40 mg/dL SLN  LDL Cholesterol (Calc) 120 H 0-99 mg/dL SLN C  Liver Profile  Result: 12/01/2012 4:26 PM ( Status: F )  Bilirubin, Total 0.4 0.3-1.2 mg/dL SLN  Bilirubin, Direct 0.1 0.0-0.3 mg/dL SLN  Indirect Bilirubin 0.3 0.0-0.9 mg/dL SLN  Alkaline Phosphatase 80 39-117 U/L SLN  AST/SGOT 11 0-37 U/L SLN  ALT/SGPT 10 0-35 U/L SLN  Total Protein 5.6 L 6.0-8.3 g/dL SLN  Albumin 3.3 L 4.1-3.2 g/dL SLN  CBC with Diff  Result: 11/14/2012 6:02 PM ( Status: F )  WBC 5.2 4.0-10.5 K/uL SLN  RBC  4.00 3.87-5.11 MIL/uL SLN  Hemoglobin 10.6 L 12.0-15.0 g/dL SLN  Hematocrit 44.0 L 36.0-46.0 % SLN  MCV 82.0 78.0-100.0 fL SLN  MCH 26.5 26.0-34.0 pg SLN  MCHC 32.3 30.0-36.0 g/dL SLN  RDW 10.2 72.5-36.6 % SLN  Platelet Count 155 150-400 K/uL SLN  Granulocyte % 62 43-77 % SLN  Absolute Gran 3.2 1.7-7.7 K/uL SLN  Lymph % 31 12-46 % SLN  Absolute Lymph 1.6 0.7-4.0 K/uL SLN  Mono % 6 3-12 % SLN  Absolute Mono 0.3 0.1-1.0 K/uL SLN  Eos % 1 0-5 % SLN  Absolute Eos 0.0 0.0-0.7 K/uL SLN  Baso % 0 0-1 % SLN  Absolute Baso 0.0 0.0-0.1 K/uL SLN  Smear Review   Basic Metabolic Panel    Result: 01/05/2013 3:28 PM   ( Status: F )     C Sodium 137     135-145 mEq/L SLN   Potassium 4.1     3.5-5.3 mEq/L SLN   Chloride 103     96-112 mEq/L SLN   CO2 30     19-32 mEq/L SLN   Glucose 86     70-99 mg/dL SLN   BUN 13     4-40 mg/dL SLN   Creatinine 3.47     0.50-1.10 mg/dL SLN   Calcium 9.0   Assessment/Plan 1. Vascular dementia without behavioral disturbance remains stable at this time; will cont namenda   2. CVA, old, hemiparesis Unchanged; will restart Lipitor at this time; to cont ASA   3. Other and unspecified hyperlipidemia Will restart lipitor 20 mg qhs  at this time due to history of CVA and elevated LDL  4. Tremors of nervous system None noted; no other intervention needed at this time

## 2013-01-27 ENCOUNTER — Ambulatory Visit: Payer: Medicare Other | Admitting: Neurology

## 2013-02-23 ENCOUNTER — Non-Acute Institutional Stay (SKILLED_NURSING_FACILITY): Payer: Medicare Other | Admitting: Nurse Practitioner

## 2013-02-23 DIAGNOSIS — R259 Unspecified abnormal involuntary movements: Secondary | ICD-10-CM

## 2013-02-23 DIAGNOSIS — I69959 Hemiplegia and hemiparesis following unspecified cerebrovascular disease affecting unspecified side: Secondary | ICD-10-CM

## 2013-02-23 DIAGNOSIS — F0150 Vascular dementia without behavioral disturbance: Secondary | ICD-10-CM

## 2013-02-23 DIAGNOSIS — R627 Adult failure to thrive: Secondary | ICD-10-CM

## 2013-02-23 DIAGNOSIS — I69359 Hemiplegia and hemiparesis following cerebral infarction affecting unspecified side: Secondary | ICD-10-CM

## 2013-02-23 DIAGNOSIS — E785 Hyperlipidemia, unspecified: Secondary | ICD-10-CM

## 2013-02-23 DIAGNOSIS — R251 Tremor, unspecified: Secondary | ICD-10-CM

## 2013-02-23 NOTE — Progress Notes (Signed)
Patient ID: LILEE ALDEA, female   DOB: [birth year], 79 y.o.   MRN: [id number]    Nursing Home Location:  [facility]   Place of Service: SNF (31)  PCP: Kimber Relic, MD  No Known Allergies  Chief Complaint  Patient presents with  . Medical Managment of Chronic Issues    HPI:  79 yo female with pmh of advanced dementia, CVA with RT sided hemiparesis, htn who is a resident of [facility] is being seen today for routine follow up. Pt doing well and without change in the last month; Staff without concerns at this time.  Review of Systems:  Review of Systems  Unable to perform ROS: dementia     Past Medical History  Diagnosis Date  . Stroke   . Dementia   . Hypertension   . Laceration of skin of eyelid and periocular area   . Other fall   . Wheelchair dependence   . Hypopotassemia   . Encounters for unspecified administrative purpose   . Other and unspecified hyperlipidemia   . Reflux esophagitis   . Blood in stool   . Routine general medical examination at a health care facility   . Hypopotassemia   . Dementia in conditions classified elsewhere with behavioral disturbance(294.11)   . Dementia in conditions classified elsewhere without behavioral disturbance(294.10)   . Loss of weight   . Cough   . Unspecified vitamin D deficiency   . Thrombocytopenia, unspecified   . Unspecified disorder of kidney and ureter   . Other and unspecified hyperlipidemia   . Senile dementia, uncomplicated   . Anxiety state, unspecified   . Unspecified essential hypertension   . Unspecified late effects of cerebrovascular disease   . Other malaise and fatigue   . Abnormality of gait    Past Surgical History  Procedure Laterality Date  . Joint replacement  2008    Dr Renae Fickle  . Abdominal hysterectomy  1981   Social History:   reports that she has never smoked. She has never used smokeless tobacco. She reports that she does not drink alcohol or use illicit drugs.  No  family history on file.  Medications: Patient's Medications  New Prescriptions   No medications on file  Previous Medications   ALPRAZOLAM (XANAX) 0.25 MG TABLET    Take 1 tablet (0.25 mg total) by mouth every 8 (eight) hours.   ASPIRIN 81 MG CHEWABLE TABLET    Chew 81 mg by mouth daily.   ATORVASTATIN (LIPITOR) 20 MG TABLET    Take 20 mg by mouth daily.   CHOLECALCIFEROL 2000 UNITS TABS    Take 2,000 tablets by mouth daily.   FEEDING SUPPLEMENT (ENSURE) PUDG    Take 1 Container by mouth daily.   FEEDING SUPPLEMENT (GLUCERNA SHAKE) LIQD    Take 237 mLs by mouth 2 (two) times daily between meals.   MAGNESIUM HYDROXIDE (MILK OF MAGNESIA) 800 MG/5ML SUSPENSION    Take 30 mLs by mouth daily as needed for constipation.   MEMANTINE HCL ER 28 MG CP24    Take 28 mg by mouth daily. to help preserve memory.   SODIUM PHOSPHATES (RA SALINE ENEMA RE)    Place rectally as needed.  Modified Medications   No medications on file  Discontinued Medications   No medications on file     Physical Exam: Physical Exam  Vitals reviewed. Constitutional: Vital signs are normal. No distress.  HENT:  Head: Normocephalic and atraumatic.  Eyes: Conjunctivae and  EOM are normal. Pupils are equal, round, and reactive to light.  Neck: Normal range of motion. Neck supple. No thyromegaly present.  Cardiovascular: Normal rate and regular rhythm.   Pulmonary/Chest: Effort normal and breath sounds normal.  Abdominal: Soft. Bowel sounds are normal. She exhibits no distension.  Musculoskeletal: She exhibits no edema and no tenderness.  currently in [facility]- with right sided hemiparesis   Neurological: She is alert.  Skin: Skin is warm and dry. She is not diaphoretic.     Filed Vitals:   02/23/13 1532  BP: 141/82  Pulse: 78  Temp: 98.7 F (37.1 C)  Resp: 20      Labs reviewed: Basic Metabolic Panel:  Recent Labs  16/10/96 0605 07/13/12 1555 07/14/12 0700  NA 155* 144 143  K 3.4* 4.5 3.7  CL 119* 108 107   CO2 27 26 28   GLUCOSE 140* 176* 153*  BUN 18 17 13   CREATININE 0.70 0.68 0.62  CALCIUM 8.5 8.5 8.5   Liver Function Tests:  Recent Labs  07/08/12 1757 07/12/12 2137  AST 19 17  ALT 13 14  ALKPHOS 130* 108  BILITOT 0.5 0.4  PROT 6.4 6.6  ALBUMIN  --  2.9*   No results found for this basename: LIPASE, AMYLASE,  in the last 8760 hours No results found for this basename: AMMONIA,  in the last 8760 hours CBC:  Recent Labs  05/06/12 0900 07/08/12 1757 07/12/12 2137 07/13/12 0605 07/14/12 0700  WBC 5.8 8.5 10.9* 9.1 9.1  NEUTROABS  --  5.8 8.5*  --   --   HGB 10.7* 13.0 12.3 10.6* 10.6*  HCT 31.8* 41.3 38.1 33.4* 32.2*  MCV 85.0 89 89.9 88.8 86.6  PLT 137*  --  124* 129* 140*   Cardiac Enzymes: No results found for this basename: CKTOTAL, CKMB, CKMBINDEX, TROPONINI,  in the last 8760 hours BNP: No components found with this basename: POCBNP,  CBG:  Recent Labs  07/15/12 0806 07/15/12 1130 07/15/12 1717  GLUCAP 118* 151* 124*   TSH: No results found for this basename: TSH,  in the last 8760 hours A1C: Lab Results  Component Value Date   HGBA1C 6.5* 07/13/2012   Lipid Profile  Result: 09/30/2012 4:06 PM ( Status: F ) C  Cholesterol 168 0-200 mg/dL SLN C  Triglyceride 40 <150 mg/dL SLN  HDL Cholesterol 45 >39 mg/dL SLN  Total Chol/HDL Ratio 3.7 Ratio SLN  VLDL Cholesterol (Calc) 8 0-40 mg/dL SLN  LDL Cholesterol (Calc) 115 H 0-99 mg/dL SLN C  Liver Profile  Result: 09/30/2012 4:06 PM ( Status: F )  Bilirubin, Total 0.4 0.3-1.2 mg/dL SLN  Bilirubin, Direct 0.1 0.0-0.3 mg/dL SLN  Indirect Bilirubin 0.3 0.0-0.9 mg/dL SLN  Alkaline Phosphatase 106 39-117 U/L SLN  AST/SGOT 13 0-37 U/L SLN  ALT/SGPT 8 0-35 U/L SLN  Total Protein 5.8 L 6.0-8.3 g/dL SLN  Albumin 3.3  CBC with Diff  Result: 09/21/2012 1:40 PM ( Status: F )  WBC 5.4 4.0-10.5 K/uL SLN  RBC 3.73 L 4.22-5.81 MIL/uL SLN  Hemoglobin 9.4 L 13.0-17.0 g/dL SLN  Hematocrit 04.5 L 39.0-52.0 % SLN  MCV  79.1 78.0-100.0 fL SLN  MCH 25.2 L 26.0-34.0 pg SLN  MCHC 31.9 30.0-36.0 g/dL SLN  RDW 40.9 81.1-91.4 % SLN  Platelet Count 243 150-400 K/uL SLN  Granulocyte % 57 43-77 % SLN  Absolute Gran 3.0 1.7-7.7 K/uL SLN  Lymph % 34 12-46 % SLN  Absolute Lymph 1.8 0.7-4.0 K/uL SLN  Mono %  8 3-12 % SLN  Absolute Mono 0.5 0.1-1.0 K/uL SLN  Eos % 1 0-5 % SLN  Absolute Eos 0.1 0.0-0.7 K/uL SLN  Baso % 0 0-1 % SLN  Absolute Baso 0.0 0.0-0.1 K/uL SLN  Smear Review NOTE SLN C  Comprehensive Metabolic Panel  Result: 09/21/2012 1:05 PM ( Status: F )  Sodium 140 135-145 mEq/L SLN  Potassium 3.9 3.5-5.3 mEq/L SLN  Chloride 103 96-112 mEq/L SLN  CO2 27 19-32 mEq/L SLN  Glucose 98 70-99 mg/dL SLN  BUN 14 9-14 mg/dL SLN  Creatinine 7.82 9.56-2.13 mg/dL SLN  Bilirubin, Total 0.4 0.3-1.2 mg/dL SLN  Alkaline Phosphatase 100 39-117 U/L SLN  AST/SGOT 14 0-37 U/L SLN  ALT/SGPT <8 0-53 U/L SLN  Total Protein 5.8 L 6.0-8.3 g/dL SLN  Albumin 3.1 L 0.8-6.5 g/dL SLN  Calcium 8.3 L 7.8-46.9 mg/dL SLN  Hemoglobin G2X  Result: 09/21/2012 4:04 PM ( Status: F )  Hemoglobin A1C 6.1 H <5.7 % SLN C  Estimated Average Glucose 128 H <117 mg/dL SLN  Vitamin D ([id number])  Result: 09/22/2012 12:38 AM ( Status: F )  Vitamin D (25-Hydroxy) 35    Lipid Profile  Result: 12/01/2012 4:26 PM ( Status: F ) C  Cholesterol 179 0-200 mg/dL SLN C  Triglyceride 42 <150 mg/dL SLN  HDL Cholesterol 51 >39 mg/dL SLN  Total Chol/HDL Ratio 3.5 Ratio SLN  VLDL Cholesterol (Calc) 8 0-40 mg/dL SLN  LDL Cholesterol (Calc) 120 H 0-99 mg/dL SLN C  Liver Profile  Result: 12/01/2012 4:26 PM ( Status: F )  Bilirubin, Total 0.4 0.3-1.2 mg/dL SLN  Bilirubin, Direct 0.1 0.0-0.3 mg/dL SLN  Indirect Bilirubin 0.3 0.0-0.9 mg/dL SLN  Alkaline Phosphatase 80 39-117 U/L SLN  AST/SGOT 11 0-37 U/L SLN  ALT/SGPT 10 0-35 U/L SLN  Total Protein 5.6 L 6.0-8.3 g/dL SLN  Albumin 3.3 L 0.1-0.2 g/dL SLN  CBC with Diff  Result: 11/14/2012 6:02 PM ( Status: F  )  WBC 5.2 4.0-10.5 K/uL SLN  RBC 4.00 3.87-5.11 MIL/uL SLN  Hemoglobin 10.6 L 12.0-15.0 g/dL SLN  Hematocrit 72.5 L 36.0-46.0 % SLN  MCV 82.0 78.0-100.0 fL SLN  MCH 26.5 26.0-34.0 pg SLN  MCHC 32.3 30.0-36.0 g/dL SLN  RDW 36.6 44.0-34.7 % SLN  Platelet Count 155 150-400 K/uL SLN  Granulocyte % 62 43-77 % SLN  Absolute Gran 3.2 1.7-7.7 K/uL SLN  Lymph % 31 12-46 % SLN  Absolute Lymph 1.6 0.7-4.0 K/uL SLN  Mono % 6 3-12 % SLN  Absolute Mono 0.3 0.1-1.0 K/uL SLN  Eos % 1 0-5 % SLN  Absolute Eos 0.0 0.0-0.7 K/uL SLN  Baso % 0 0-1 % SLN  Absolute Baso 0.0 0.0-0.1 K/uL SLN  Smear Review  Basic Metabolic Panel  Result: 01/05/2013 3:28 PM ( Status: F ) C  Sodium 137 135-145 mEq/L SLN  Potassium 4.1 3.5-5.3 mEq/L SLN  Chloride 103 96-112 mEq/L SLN  CO2 30 19-32 mEq/L SLN  Glucose 86 70-99 mg/dL SLN  BUN 13 4-25 mg/dL SLN  Creatinine 9.56 3.87-5.64 mg/dL SLN  Calcium 9.0     Assessment/Plan 1. Tremors of nervous system -mild jerking movements with passive ROM but resolves with active ROM and rest; nursing to cont to monitor  2. Other and unspecified hyperlipidemia -conts on lipitor   3. FTT (failure to thrive) in adult -weight remains stable -conts supplements  4. CVA, old, hemiparesis -conts ASA; no worsening of symtoms  5. Vascular dementia without behavioral disturbance -conts on namenda; without changes  in the last month

## 2013-03-30 ENCOUNTER — Non-Acute Institutional Stay (SKILLED_NURSING_FACILITY): Payer: Medicare Other | Admitting: Nurse Practitioner

## 2013-03-30 DIAGNOSIS — I10 Essential (primary) hypertension: Secondary | ICD-10-CM

## 2013-03-30 DIAGNOSIS — I69359 Hemiplegia and hemiparesis following cerebral infarction affecting unspecified side: Secondary | ICD-10-CM

## 2013-03-30 DIAGNOSIS — F0150 Vascular dementia without behavioral disturbance: Secondary | ICD-10-CM

## 2013-03-30 DIAGNOSIS — E785 Hyperlipidemia, unspecified: Secondary | ICD-10-CM

## 2013-03-30 DIAGNOSIS — I69959 Hemiplegia and hemiparesis following unspecified cerebrovascular disease affecting unspecified side: Secondary | ICD-10-CM

## 2013-03-30 NOTE — Progress Notes (Signed)
Patient ID: TIMOTHEA BODENHEIMER, female   DOB: March 02, 1929, 80 y.o.   MRN: [id number]    Nursing Home Location:  [facility]   Place of Service: SNF (31)  PCP: Kimber Relic, MD  No Known Allergies  Chief Complaint  Patient presents with  . Medical Managment of Chronic Issues    HPI:  80 yo female with pmh of advanced dementia, CVA with RT sided hemiparesis, htn who is a resident of [facility] is being seen today for routine follow up of chronic conditions. Pt doing well and without change in the last month; Staff without concerns at this time. Blood pressures are high on review of vitals; otherwise pt remains unchanged in last month    Review of Systems:  Unable to perform due to dementia   Past Medical History  Diagnosis Date  . Stroke   . Dementia   . Hypertension   . Laceration of skin of eyelid and periocular area   . Other fall   . Wheelchair dependence   . Hypopotassemia   . Encounters for unspecified administrative purpose   . Other and unspecified hyperlipidemia   . Reflux esophagitis   . Blood in stool   . Routine general medical examination at a health care facility   . Hypopotassemia   . Dementia in conditions classified elsewhere with behavioral disturbance(294.11)   . Dementia in conditions classified elsewhere without behavioral disturbance(294.10)   . Loss of weight   . Cough   . Unspecified vitamin D deficiency   . Thrombocytopenia, unspecified   . Unspecified disorder of kidney and ureter   . Other and unspecified hyperlipidemia   . Senile dementia, uncomplicated   . Anxiety state, unspecified   . Unspecified essential hypertension   . Unspecified late effects of cerebrovascular disease   . Other malaise and fatigue   . Abnormality of gait    Past Surgical History  Procedure Laterality Date  . Joint replacement  2008    Dr Renae Fickle  . Abdominal hysterectomy  1981   Social History:   reports that she has never smoked. She has never used  smokeless tobacco. She reports that she does not drink alcohol or use illicit drugs.  No family history on file.  Medications: Patient's Medications  New Prescriptions   No medications on file  Previous Medications   ALPRAZOLAM (XANAX) 0.25 MG TABLET    Take 1 tablet (0.25 mg total) by mouth every 8 (eight) hours.   ASPIRIN 81 MG CHEWABLE TABLET    Chew 81 mg by mouth daily.   ATORVASTATIN (LIPITOR) 20 MG TABLET    Take 20 mg by mouth daily.   CHOLECALCIFEROL 2000 UNITS TABS    Take 2,000 tablets by mouth daily.   FEEDING SUPPLEMENT (ENSURE) PUDG    Take 1 Container by mouth daily.   FEEDING SUPPLEMENT (GLUCERNA SHAKE) LIQD    Take 237 mLs by mouth 2 (two) times daily between meals.   MAGNESIUM HYDROXIDE (MILK OF MAGNESIA) 800 MG/5ML SUSPENSION    Take 30 mLs by mouth daily as needed for constipation.   MEMANTINE HCL ER 28 MG CP24    Take 28 mg by mouth daily. to help preserve memory.   SODIUM PHOSPHATES (RA SALINE ENEMA RE)    Place rectally as needed.  Modified Medications   No medications on file  Discontinued Medications   No medications on file     Physical Exam:  Filed Vitals:   03/30/13 1417  BP: 160/72  Pulse: 72  Temp: 98.2 F (36.8 C)  Resp: 20  Weight: 110 lb 9.6 oz (50.168 kg)  Physical Exam  Vitals reviewed.  Constitutional: Vital signs are normal. No distress.  HEENT: unremarkable  Neck: Normal range of motion. Neck supple. No thyromegaly present.  Cardiovascular: Normal rate and regular rhythm.  Pulmonary/Chest: Effort normal and breath sounds normal.  Abdominal: Soft. Bowel sounds are normal. She exhibits no distension.  Musculoskeletal: She exhibits no edema and no tenderness.  currently in [facility]- with right sided hemiparesis  Neurological: She is alert.  Skin: Skin is warm and dry. She is not diaphoretic.       Labs reviewed: Basic Metabolic Panel:  Recent Labs  40/98/11 0605 07/13/12 1555 07/14/12 0700  NA 155* 144 143  K 3.4* 4.5 3.7  CL  119* 108 107  CO2 27 26 28   GLUCOSE 140* 176* 153*  BUN 18 17 13   CREATININE 0.70 0.68 0.62  CALCIUM 8.5 8.5 8.5   Liver Function Tests:  Recent Labs  07/08/12 1757 07/12/12 2137  AST 19 17  ALT 13 14  ALKPHOS 130* 108  BILITOT 0.5 0.4  PROT 6.4 6.6  ALBUMIN  --  2.9*   No results found for this basename: LIPASE, AMYLASE,  in the last 8760 hours No results found for this basename: AMMONIA,  in the last 8760 hours CBC:  Recent Labs  05/06/12 0900 07/08/12 1757 07/12/12 2137 07/13/12 0605 07/14/12 0700  WBC 5.8 8.5 10.9* 9.1 9.1  NEUTROABS  --  5.8 8.5*  --   --   HGB 10.7* 13.0 12.3 10.6* 10.6*  HCT 31.8* 41.3 38.1 33.4* 32.2*  MCV 85.0 89 89.9 88.8 86.6  PLT 137*  --  124* 129* 140*   Cardiac Enzymes: No results found for this basename: CKTOTAL, CKMB, CKMBINDEX, TROPONINI,  in the last 8760 hours BNP: No components found with this basename: POCBNP,  CBG:  Recent Labs  07/15/12 0806 07/15/12 1130 07/15/12 1717  GLUCAP 118* 151* 124*   TSH: No results found for this basename: TSH,  in the last 8760 hours A1C: Lab Results  Component Value Date   HGBA1C 6.5* 07/13/2012  Lipid Profile  Result: 12/01/2012 4:26 PM ( Status: F ) C  Cholesterol 179 0-200 mg/dL SLN C  Triglyceride 42 <150 mg/dL SLN  HDL Cholesterol 51 >39 mg/dL SLN  Total Chol/HDL Ratio 3.5 Ratio SLN  VLDL Cholesterol (Calc) 8 0-40 mg/dL SLN  LDL Cholesterol (Calc) 120 H 0-99 mg/dL SLN C  Liver Profile  Result: 12/01/2012 4:26 PM ( Status: F )  Bilirubin, Total 0.4 0.3-1.2 mg/dL SLN  Bilirubin, Direct 0.1 0.0-0.3 mg/dL SLN  Indirect Bilirubin 0.3 0.0-0.9 mg/dL SLN  Alkaline Phosphatase 80 39-117 U/L SLN  AST/SGOT 11 0-37 U/L SLN  ALT/SGPT 10 0-35 U/L SLN  Total Protein 5.6 L 6.0-8.3 g/dL SLN  Albumin 3.3 L 9.1-4.7 g/dL SLN  CBC with Diff  Result: 11/14/2012 6:02 PM ( Status: F )  WBC 5.2 4.0-10.5 K/uL SLN  RBC 4.00 3.87-5.11 MIL/uL SLN  Hemoglobin 10.6 L 12.0-15.0 g/dL SLN  Hematocrit  82.9 L 36.0-46.0 % SLN  MCV 82.0 78.0-100.0 fL SLN  MCH 26.5 26.0-34.0 pg SLN  MCHC 32.3 30.0-36.0 g/dL SLN  RDW 56.2 13.0-86.5 % SLN  Platelet Count 155 150-400 K/uL SLN  Granulocyte % 62 43-77 % SLN  Absolute Gran 3.2 1.7-7.7 K/uL SLN  Lymph % 31 12-46 % SLN  Absolute Lymph 1.6 0.7-4.0 K/uL  SLN  Mono % 6 3-12 % SLN  Absolute Mono 0.3 0.1-1.0 K/uL SLN  Eos % 1 0-5 % SLN  Absolute Eos 0.0 0.0-0.7 K/uL SLN  Baso % 0 0-1 % SLN  Absolute Baso 0.0 0.0-0.1 K/uL SLN  Smear Review  Basic Metabolic Panel  Result: 01/05/2013 3:28 PM ( Status: F ) C  Sodium 137 135-145 mEq/L SLN  Potassium 4.1 3.5-5.3 mEq/L SLN  Chloride 103 96-112 mEq/L SLN  CO2 30 19-32 mEq/L SLN  Glucose 86 70-99 mg/dL SLN  BUN 13 0-986-23 mg/dL SLN  Creatinine 1.190.63 1.47-8.290.50-1.10 mg/dL SLN  Calcium 9.0  Lipid Profile    Result: 03/01/2013 3:35 PM   ( Status: F )       Cholesterol 165     0-200 mg/dL SLN C Triglyceride 48     <150 mg/dL SLN   HDL Cholesterol 49     >39 mg/dL SLN   Total Chol/HDL Ratio 3.4      Ratio SLN   VLDL Cholesterol (Calc) 10     0-40 mg/dL SLN   LDL Cholesterol (Calc) 106   H 0-99 mg/dL SLN C Liver Profile    Result: 03/01/2013 3:35 PM   ( Status: F )       Bilirubin, Total 0.4     0.3-1.2 mg/dL SLN   Bilirubin, Direct 0.1     0.0-0.3 mg/dL SLN   Indirect Bilirubin 0.3     0.0-0.9 mg/dL SLN   Alkaline Phosphatase 88     39-117 U/L SLN   AST/SGOT 12     0-37 U/L SLN   ALT/SGPT 9     0-35 U/L SLN   Total Protein 5.7   L 6.0-8.3 g/dL SLN   Albumin 3.4   L 5.6-2.13.5-5.2 g/dL SLN   Assessment/Plan 1. Vascular dementia without behavioral disturbance -pt remains stable in current environment; there has been little changes in the last month  2. Other and unspecified hyperlipidemia -stable conts on lipitor  3. CVA, old, hemiparesis - unchanged; colds on ASA  4. Essential hypertension, benign -blood pressure reviewed; sbp trending up and in the160s  -will add lisinopril 10 mg PO daily and have staff  take BP daily at this time  5. Tremors of nervous system  -none noted in the last month; will cont to monitor

## 2013-04-27 ENCOUNTER — Encounter: Payer: Self-pay | Admitting: Nurse Practitioner

## 2013-04-27 ENCOUNTER — Non-Acute Institutional Stay (SKILLED_NURSING_FACILITY): Payer: Medicare Other | Admitting: Nurse Practitioner

## 2013-04-27 DIAGNOSIS — F0150 Vascular dementia without behavioral disturbance: Secondary | ICD-10-CM

## 2013-04-27 DIAGNOSIS — E785 Hyperlipidemia, unspecified: Secondary | ICD-10-CM

## 2013-04-27 DIAGNOSIS — I69359 Hemiplegia and hemiparesis following cerebral infarction affecting unspecified side: Secondary | ICD-10-CM

## 2013-04-27 DIAGNOSIS — R259 Unspecified abnormal involuntary movements: Secondary | ICD-10-CM

## 2013-04-27 DIAGNOSIS — I69959 Hemiplegia and hemiparesis following unspecified cerebrovascular disease affecting unspecified side: Secondary | ICD-10-CM

## 2013-04-27 DIAGNOSIS — I10 Essential (primary) hypertension: Secondary | ICD-10-CM

## 2013-04-27 DIAGNOSIS — R251 Tremor, unspecified: Secondary | ICD-10-CM

## 2013-04-27 NOTE — Progress Notes (Signed)
Patient ID: Tracie [name], female   DOB: [birth year], 80 y.o.   MRN: [id number]    Nursing Home Location:  [facility]   Place of Service: SNF (31)  PCP: Kimber Relic, MD  No Known Allergies  Chief Complaint  Patient presents with  . Medical Managment of Chronic Issues    HPI:  80 year old female with pmh of advanced dementia, CVA with RT sided hemiparesis, htn who is a resident of [facility] is being seen today for routine follow up of chronic conditions. Pt doing well and without change in the last month; family with pt during visit and with out concerns; Staff without concerns at this time. Pt conts to eat and drink well per family, minimal communication with advanced dementia   Review of Systems:  Unable to obtain   Past Medical History  Diagnosis Date  . Stroke   . Dementia   . Hypertension   . Laceration of skin of eyelid and periocular area   . Other fall   . Wheelchair dependence   . Hypopotassemia   . Encounters for unspecified administrative purpose   . Other and unspecified hyperlipidemia   . Reflux esophagitis   . Blood in stool   . Routine general medical examination at a health care facility   . Hypopotassemia   . Dementia in conditions classified elsewhere with behavioral disturbance   . Dementia in conditions classified elsewhere without behavioral disturbance   . Loss of weight   . Cough   . Unspecified vitamin D deficiency   . Thrombocytopenia, unspecified   . Unspecified disorder of kidney and ureter   . Other and unspecified hyperlipidemia   . Senile dementia, uncomplicated   . Anxiety state, unspecified   . Unspecified essential hypertension   . Unspecified late effects of cerebrovascular disease   . Other malaise and fatigue   . Abnormality of gait    Past Surgical History  Procedure Laterality Date  . Joint replacement  2008    Dr Renae Fickle  . Abdominal hysterectomy  1981   Social History:   reports that she has never smoked.  She has never used smokeless tobacco. She reports that she does not drink alcohol or use illicit drugs.  No family history on file.  Medications: Patient's Medications  New Prescriptions   No medications on file  Previous Medications   ALPRAZOLAM (XANAX) 0.25 MG TABLET    Take 1 tablet (0.25 mg total) by mouth every 8 (eight) hours.   ASPIRIN 81 MG CHEWABLE TABLET    Chew 81 mg by mouth daily.   ATORVASTATIN (LIPITOR) 20 MG TABLET    Take 20 mg by mouth daily.   CHOLECALCIFEROL 2000 UNITS TABS    Take 2,000 tablets by mouth daily.   FEEDING SUPPLEMENT (ENSURE) PUDG    Take 1 Container by mouth daily.   FEEDING SUPPLEMENT (GLUCERNA SHAKE) LIQD    Take 237 mLs by mouth 2 (two) times daily between meals.   MAGNESIUM HYDROXIDE (MILK OF MAGNESIA) 800 MG/5ML SUSPENSION    Take 30 mLs by mouth daily as needed for constipation.   MEMANTINE HCL ER 28 MG CP24    Take 28 mg by mouth daily. to help preserve memory.   SODIUM PHOSPHATES (RA SALINE ENEMA RE)    Place rectally as needed.  Modified Medications   No medications on file  Discontinued Medications   No medications on file     Physical Exam:  Filed Vitals:  04/27/13 1504  BP: 115/76  Pulse: 78  Temp: 98 F (36.7 C)  Resp: 20  Weight: 110 lb (49.896 kg)    Physical Exam  Vitals reviewed. Constitutional: Vital signs are normal. No distress.  Thin female in NAD  HENT:  Head: Normocephalic and atraumatic.  Nose: Nose normal.  Mouth/Throat: Oropharynx is clear and moist. No oropharyngeal exudate.  Eyes: Conjunctivae and EOM are normal. Pupils are equal, round, and reactive to light.  Neck: Normal range of motion. Neck supple. No thyromegaly present.  Cardiovascular: Normal rate and regular rhythm.   Pulmonary/Chest: Effort normal and breath sounds normal.  Abdominal: Soft. Bowel sounds are normal. She exhibits no distension.  Musculoskeletal: She exhibits no edema and no tenderness.  [facility] with right sided  hemiparesis with contractures to right wrist   Neurological: She is alert.  Skin: Skin is warm and dry. She is not diaphoretic.     Labs reviewed: Basic Metabolic Panel:  Recent Labs  16/12/9602/21/14 0605 07/13/12 1555 07/14/12 0700  NA 155* 144 143  K 3.4* 4.5 3.7  CL 119* 108 107  CO2 27 26 28   GLUCOSE 140* 176* 153*  BUN 18 17 13   CREATININE 0.70 0.68 0.62  CALCIUM 8.5 8.5 8.5   Liver Function Tests:  Recent Labs  07/08/12 1757 07/12/12 2137  AST 19 17  ALT 13 14  ALKPHOS 130* 108  BILITOT 0.5 0.4  PROT 6.4 6.6  ALBUMIN  --  2.9*   No results found for this basename: LIPASE, AMYLASE,  in the last 8760 hours No results found for this basename: AMMONIA,  in the last 8760 hours CBC:  Recent Labs  05/06/12 0900 07/08/12 1757 07/12/12 2137 07/13/12 0605 07/14/12 0700  WBC 5.8 8.5 10.9* 9.1 9.1  NEUTROABS  --  5.8 8.5*  --   --   HGB 10.7* 13.0 12.3 10.6* 10.6*  HCT 31.8* 41.3 38.1 33.4* 32.2*  MCV 85.0 89 89.9 88.8 86.6  PLT 137*  --  124* 129* 140*   Cardiac Enzymes: No results found for this basename: CKTOTAL, CKMB, CKMBINDEX, TROPONINI,  in the last 8760 hours BNP: No components found with this basename: POCBNP,  CBG:  Recent Labs  07/15/12 0806 07/15/12 1130 07/15/12 1717  GLUCAP 118* 151* 124*   TSH: No results found for this basename: TSH,  in the last 8760 hours A1C: Lab Results  Component Value Date   HGBA1C 6.5* 07/13/2012   Lipid Profile  Result: 12/01/2012 4:26 PM ( Status: F ) C  Cholesterol 179 0-200 mg/dL SLN C  Triglyceride 42 <150 mg/dL SLN  HDL Cholesterol 51 >39 mg/dL SLN  Total Chol/HDL Ratio 3.5 Ratio SLN  VLDL Cholesterol (Calc) 8 0-40 mg/dL SLN  LDL Cholesterol (Calc) 120 H 0-99 mg/dL SLN C  Liver Profile  Result: 12/01/2012 4:26 PM ( Status: F )  Bilirubin, Total 0.4 0.3-1.2 mg/dL SLN  Bilirubin, Direct 0.1 0.0-0.3 mg/dL SLN  Indirect Bilirubin 0.3 0.0-0.9 mg/dL SLN  Alkaline Phosphatase 80 39-117 U/L SLN  AST/SGOT 11  0-37 U/L SLN  ALT/SGPT 10 0-35 U/L SLN  Total Protein 5.6 L 6.0-8.3 g/dL SLN  Albumin 3.3 L 0.4-5.43.5-5.2 g/dL SLN  CBC with Diff  Result: 11/14/2012 6:02 PM ( Status: F )  WBC 5.2 4.0-10.5 K/uL SLN  RBC 4.00 3.87-5.11 MIL/uL SLN  Hemoglobin 10.6 L 12.0-15.0 g/dL SLN  Hematocrit 09.832.8 L 36.0-46.0 % SLN  MCV 82.0 78.0-100.0 fL SLN  MCH 26.5 26.0-34.0 pg SLN  MCHC 32.3 30.0-36.0 g/dL SLN  RDW 60.4 54.0-98.1 % SLN  Platelet Count 155 150-400 K/uL SLN  Granulocyte % 62 43-77 % SLN  Absolute Gran 3.2 1.7-7.7 K/uL SLN  Lymph % 31 12-46 % SLN  Absolute Lymph 1.6 0.7-4.0 K/uL SLN  Mono % 6 3-12 % SLN  Absolute Mono 0.3 0.1-1.0 K/uL SLN  Eos % 1 0-5 % SLN  Absolute Eos 0.0 0.0-0.7 K/uL SLN  Baso % 0 0-1 % SLN  Absolute Baso 0.0 0.0-0.1 K/uL SLN  Smear Review  Basic Metabolic Panel  Result: 01/05/2013 3:28 PM ( Status: F ) C  Sodium 137 135-145 mEq/L SLN  Potassium 4.1 3.5-5.3 mEq/L SLN  Chloride 103 96-112 mEq/L SLN  CO2 30 19-32 mEq/L SLN  Glucose 86 70-99 mg/dL SLN  BUN 13 1-91 mg/dL SLN  Creatinine 4.78 2.95-6.21 mg/dL SLN  Calcium 9.0  Lipid Profile  Result: 03/01/2013 3:35 PM ( Status: F )  Cholesterol 165 0-200 mg/dL SLN C  Triglyceride 48 <150 mg/dL SLN  HDL Cholesterol 49 >39 mg/dL SLN  Total Chol/HDL Ratio 3.4 Ratio SLN  VLDL Cholesterol (Calc) 10 3-08 mg/dL SLN  LDL Cholesterol (Calc) 106 H 0-99 mg/dL SLN C  Liver Profile  Result: 03/01/2013 3:35 PM ( Status: F )  Bilirubin, Total 0.4 0.3-1.2 mg/dL SLN  Bilirubin, Direct 0.1 0.0-0.3 mg/dL SLN  Indirect Bilirubin 0.3 0.0-0.9 mg/dL SLN  Alkaline Phosphatase 88 39-117 U/L SLN  AST/SGOT 12 0-37 U/L SLN  ALT/SGPT 9 0-35 U/L SLN  Total Protein 5.7 L 6.0-8.3 g/dL SLN  Albumin 3.4 L 6.5-7.8 g/dL SLN  Assessment/Plan 1. CVA, old, hemiparesis -stable conts on ASA  2. Vascular dementia without behavioral disturbance -conts on namenda; with advanced disease, still eating and drinking well per family; does well in current  environment   3. Essential hypertension, benign - improved on lisinopril; will follow up bmp  4. Tremors of nervous system -remains stable; occasional episodes; has PRN   5. Other and unspecified hyperlipidemia -stable; conts on lipitor  6. Vit D def -on vit d 2000 units; will follow up vit d level

## 2013-05-25 ENCOUNTER — Non-Acute Institutional Stay (SKILLED_NURSING_FACILITY): Payer: Medicare Other | Admitting: Nurse Practitioner

## 2013-05-25 DIAGNOSIS — I10 Essential (primary) hypertension: Secondary | ICD-10-CM

## 2013-05-25 DIAGNOSIS — F0150 Vascular dementia without behavioral disturbance: Secondary | ICD-10-CM

## 2013-05-25 DIAGNOSIS — E559 Vitamin D deficiency, unspecified: Secondary | ICD-10-CM

## 2013-05-25 DIAGNOSIS — E785 Hyperlipidemia, unspecified: Secondary | ICD-10-CM

## 2013-05-25 DIAGNOSIS — R251 Tremor, unspecified: Secondary | ICD-10-CM

## 2013-05-25 DIAGNOSIS — R259 Unspecified abnormal involuntary movements: Secondary | ICD-10-CM

## 2013-05-25 DIAGNOSIS — I69959 Hemiplegia and hemiparesis following unspecified cerebrovascular disease affecting unspecified side: Secondary | ICD-10-CM

## 2013-05-25 DIAGNOSIS — I69359 Hemiplegia and hemiparesis following cerebral infarction affecting unspecified side: Secondary | ICD-10-CM

## 2013-05-25 NOTE — Progress Notes (Signed)
Patient ID: Tracie [name], female   DOB: Jun 13, 1928, 80 y.o.   MRN: [id number]    Nursing Home Location:  [facility]   Place of Service: SNF (31)  PCP: Kimber Relic, MD  No Known Allergies  Chief Complaint  Patient presents with  . Medical Managment of Chronic Issues    HPI:  80 year old female with pmh of advanced dementia, CVA with RT sided hemiparesis, htn who is a resident of [facility] is being seen today for routine follow up of chronic conditions. Pt doing well with no changes in the last month, staff without concern.   Review of Systems:  Review of Systems  Unable to perform ROS: dementia     Past Medical History  Diagnosis Date  . Stroke   . Dementia   . Hypertension   . Laceration of skin of eyelid and periocular area   . Other fall   . Wheelchair dependence   . Hypopotassemia   . Encounters for unspecified administrative purpose   . Other and unspecified hyperlipidemia   . Reflux esophagitis   . Blood in stool   . Routine general medical examination at a health care facility   . Hypopotassemia   . Dementia in conditions classified elsewhere with behavioral disturbance   . Dementia in conditions classified elsewhere without behavioral disturbance   . Loss of weight   . Cough   . Unspecified vitamin D deficiency   . Thrombocytopenia, unspecified   . Unspecified disorder of kidney and ureter   . Other and unspecified hyperlipidemia   . Senile dementia, uncomplicated   . Anxiety state, unspecified   . Unspecified essential hypertension   . Unspecified late effects of cerebrovascular disease   . Other malaise and fatigue   . Abnormality of gait    Past Surgical History  Procedure Laterality Date  . Joint replacement  2008    Dr Renae Fickle  . Abdominal hysterectomy  1981   Social History:   reports that she has never smoked. She has never used smokeless tobacco. She reports that she does not drink alcohol or use illicit drugs.  No family  history on file.  Medications: Patient's Medications  New Prescriptions   No medications on file  Previous Medications   ALPRAZOLAM (XANAX) 0.25 MG TABLET    Take 1 tablet (0.25 mg total) by mouth every 8 (eight) hours.   ASPIRIN 81 MG CHEWABLE TABLET    Chew 81 mg by mouth daily.   ATORVASTATIN (LIPITOR) 20 MG TABLET    Take 20 mg by mouth daily.   CHOLECALCIFEROL 2000 UNITS TABS    Take 2,000 tablets by mouth daily.   FEEDING SUPPLEMENT (ENSURE) PUDG    Take 1 Container by mouth daily.   FEEDING SUPPLEMENT (GLUCERNA SHAKE) LIQD    Take 237 mLs by mouth 2 (two) times daily between meals.   LISINOPRIL (PRINIVIL,ZESTRIL) 10 MG TABLET    Take 10 mg by mouth daily.   MAGNESIUM HYDROXIDE (MILK OF MAGNESIA) 800 MG/5ML SUSPENSION    Take 30 mLs by mouth daily as needed for constipation.   MEMANTINE HCL ER 28 MG CP24    Take 28 mg by mouth daily. to help preserve memory.   SODIUM PHOSPHATES (RA SALINE ENEMA RE)    Place rectally as needed.  Modified Medications   No medications on file  Discontinued Medications   No medications on file     Physical Exam:  Filed Vitals:  05/25/13 1151  BP: 149/85  Pulse: 72  Temp: 97.6 F (36.4 C)  Resp: 20   Physical Exam  Vitals reviewed. Constitutional: Vital signs are normal.  Thin female in NAD  HENT:  Head: Normocephalic and atraumatic.  Nose: Nose normal.  Mouth/Throat: Oropharynx is clear and moist. No oropharyngeal exudate.  Eyes: Conjunctivae and EOM are normal. Pupils are equal, round, and reactive to light.  Neck: Normal range of motion. Neck supple.  Cardiovascular: Normal rate and regular rhythm.   Pulmonary/Chest: Effort normal and breath sounds normal.  Abdominal: Soft. Bowel sounds are normal. She exhibits no distension.  Musculoskeletal: She exhibits no edema.  [facility] sided hem[facility] with contractures to right wrist   Neurological: She is alert.  Skin: Skin is warm and dry.  Psychiatric:  Smiles on  exam      Labs reviewed: Basic Metabolic Panel:  Recent Labs  16/12/9602/21/14 0605 07/13/12 1555 07/14/12 0700  NA 155* 144 143  K 3.4* 4.5 3.7  CL 119* 108 107  CO2 27 26 28   GLUCOSE 140* 176* 153*  BUN 18 17 13   CREATININE 0.70 0.68 0.62  CALCIUM 8.5 8.5 8.5   Liver Function Tests:  Recent Labs  07/08/12 1757 07/12/12 2137  AST 19 17  ALT 13 14  ALKPHOS 130* 108  BILITOT 0.5 0.4  PROT 6.4 6.6  ALBUMIN  --  2.9*   No results found for this basename: LIPASE, AMYLASE,  in the last 8760 hours No results found for this basename: AMMONIA,  in the last 8760 hours CBC:  Recent Labs  07/08/12 1757 07/12/12 2137 07/13/12 0605 07/14/12 0700  WBC 8.5 10.9* 9.1 9.1  NEUTROABS 5.8 8.5*  --   --   HGB 13.0 12.3 10.6* 10.6*  HCT 41.3 38.1 33.4* 32.2*  MCV 89 89.9 88.8 86.6  PLT  --  124* 129* 140*    Assessment/Plan 1. CVA, old, hemiparesis -stable, conts ASA  2. Vascular dementia without behavioral disturbance -conts on namenda BID  3. Essential hypertension, benign -blood pressure slightly elevated today, no other recent readings will get staff to take bp daily  4. Other and unspecified hyperlipidemia -stable on lipitor  5. Tremors of nervous system -remain stable  6. VIt D Def -Vit D level 45, will cont supplements

## 2013-05-28 ENCOUNTER — Non-Acute Institutional Stay (SKILLED_NURSING_FACILITY): Payer: Medicare Other | Admitting: Nurse Practitioner

## 2013-05-28 DIAGNOSIS — R509 Fever, unspecified: Secondary | ICD-10-CM

## 2013-05-28 NOTE — Progress Notes (Signed)
Patient ID: Tracie [name], female   DOB: September 23, 1928, 80 y.o.   MRN: [id number]    Nursing Home Location:  [facility]   Place of Service: SNF (31)  PCP: Kimber Relic, MD  No Known Allergies  Chief Complaint  Patient presents with  . Acute Visit    HPI:  80 year old female with a pmh of advanced dementia, CVA with RT sided hemiparesis, htn who is  Being seen today at the request of nursing; per nursing staff pt was warm to touch so they took her temp which was 101.4; pt conts to remain at baseline, getting oob to chair daily, still with good PO intake. No shortness of breath noted, no cough or congestion. Vitals revealed decrease BP and elevated HR over the past 24 hours; today temp was 99.4 and HR stable at 88 at time of visit but previous documentation reveal elevated HR at 120; pt on PRN tylenol for fevers. Husband has not noticed any difference in behaviors due to pts advanced dementia she does not provide any information.   Review of Systems:  Unable to obtain  Past Medical History  Diagnosis Date  . Stroke   . Dementia   . Hypertension   . Laceration of skin of eyelid and periocular area   . Other fall   . Wheelchair dependence   . Hypopotassemia   . Encounters for unspecified administrative purpose   . Other and unspecified hyperlipidemia   . Reflux esophagitis   . Blood in stool   . Routine general medical examination at a health care facility   . Hypopotassemia   . Dementia in conditions classified elsewhere with behavioral disturbance   . Dementia in conditions classified elsewhere without behavioral disturbance   . Loss of weight   . Cough   . Unspecified vitamin D deficiency   . Thrombocytopenia, unspecified   . Unspecified disorder of kidney and ureter   . Other and unspecified hyperlipidemia   . Senile dementia, uncomplicated   . Anxiety state, unspecified   . Unspecified essential hypertension   . Unspecified late effects of  cerebrovascular disease   . Other malaise and fatigue   . Abnormality of gait    Past Surgical History  Procedure Laterality Date  . Joint replacement  2008    Dr Renae Fickle  . Abdominal hysterectomy  1981   Social History:   reports that she has never smoked. She has never used smokeless tobacco. She reports that she does not drink alcohol or use illicit drugs.  No family history on file.  Medications: Patient's Medications  New Prescriptions   No medications on file  Previous Medications   ALPRAZOLAM (XANAX) 0.25 MG TABLET    Take 1 tablet (0.25 mg total) by mouth every 8 (eight) hours.   ASPIRIN 81 MG CHEWABLE TABLET    Chew 81 mg by mouth daily.   ATORVASTATIN (LIPITOR) 20 MG TABLET    Take 20 mg by mouth daily.   CHOLECALCIFEROL 2000 UNITS TABS    Take 2,000 tablets by mouth daily.   FEEDING SUPPLEMENT (ENSURE) PUDG    Take 1 Container by mouth daily.   FEEDING SUPPLEMENT (GLUCERNA SHAKE) LIQD    Take 237 mLs by mouth 2 (two) times daily between meals.   LISINOPRIL (PRINIVIL,ZESTRIL) 10 MG TABLET    Take 10 mg by mouth daily.   MAGNESIUM HYDROXIDE (MILK OF MAGNESIA) 800 MG/5ML SUSPENSION    Take 30 mLs by mouth daily  as needed for constipation.   MEMANTINE HCL ER 28 MG CP24    Take 28 mg by mouth daily. to help preserve memory.   SODIUM PHOSPHATES (RA SALINE ENEMA RE)    Place rectally as needed.  Modified Medications   No medications on file  Discontinued Medications   No medications on file     Physical Exam:  Filed Vitals:   05/28/13 1419  BP: 146/75  Pulse: 88  Temp: 99.4 F (37.4 C)  Resp: 20    Physical Exam  Vitals reviewed. Constitutional: Vital signs are normal.  Thin female in NAD  HENT:  Head: Normocephalic and atraumatic.  Nose: Nose normal.  Mouth/Throat: Oropharynx is clear and moist. No oropharyngeal exudate.  Eyes: Conjunctivae and EOM are normal. Pupils are equal, round, and reactive to light.  Neck: Normal range of motion. Neck supple.    Cardiovascular: Normal rate, regular rhythm and normal heart sounds.   Pulmonary/Chest: Effort normal and breath sounds normal.  Abdominal: Soft. Bowel sounds are normal. She exhibits no distension.  Musculoskeletal: She exhibits no edema.  Neurological: She is alert.  Skin: Skin is warm and dry.     Labs reviewed: Basic Metabolic Panel:  Recent Labs  81/19/14 0605 07/13/12 1555 07/14/12 0700  NA 155* 144 143  K 3.4* 4.5 3.7  CL 119* 108 107  CO2 27 26 28   GLUCOSE 140* 176* 153*  BUN 18 17 13   CREATININE 0.70 0.68 0.62  CALCIUM 8.5 8.5 8.5   Liver Function Tests:  Recent Labs  07/08/12 1757 07/12/12 2137  AST 19 17  ALT 13 14  ALKPHOS 130* 108  BILITOT 0.5 0.4  PROT 6.4 6.6  ALBUMIN  --  2.9*   No results found for this basename: LIPASE, AMYLASE,  in the last 8760 hours No results found for this basename: AMMONIA,  in the last 8760 hours CBC:  Recent Labs  07/08/12 1757 07/12/12 2137 07/13/12 0605 07/14/12 0700  WBC 8.5 10.9* 9.1 9.1  NEUTROABS 5.8 8.5*  --   --   HGB 13.0 12.3 10.6* 10.6*  HCT 41.3 38.1 33.4* 32.2*  MCV 89 89.9 88.8 86.6  PLT  --  124* 129* 140*   Cardiac Enzymes: No results found for this basename: CKTOTAL, CKMB, CKMBINDEX, TROPONINI,  in the last 8760 hours BNP: No components found with this basename: POCBNP,  CBG:  Recent Labs  07/15/12 0806 07/15/12 1130 07/15/12 1717  GLUCAP 118* 151* 124*   TSH: No results found for this basename: TSH,  in the last 8760 hours A1C: Lab Results  Component Value Date   HGBA1C 6.5* 07/13/2012   Urinalysis rflx Microscopic    Result: 05/28/2013 12:07 PM   ( Status: P )       Color YELLOW     YELLOW  SLN   Appearance CLEAR     CLEAR  SLN   Specific Gravity 1.024     1.005-1.030  SLN   pH 6.0     5.0-8.0  SLN   Glucose NEG     NEG mg/dL SLN   Bilirubin SMALL   A NEG  SLN   Ketone NEG     NEG mg/dL SLN   Blood NEG     NEG  SLN   Protein NEG     NEG mg/dL SLN   Urobilinogen 1      0.0-1.0 mg/dL SLN   Nitrite NEG     NEG  SLN   Leukocyte  Esterase NEG CBC NO Diff (Complete Blood Count)    Result: 05/27/2013 9:31 PM   ( Status: F )       WBC 8.2     4.0-10.5 K/uL SLN   RBC 4.39     3.87-5.11 MIL/uL SLN   Hemoglobin 12.4     12.0-15.0 g/dL SLN   Hematocrit 16.137.2     36.0-46.0 % SLN   MCV 84.7     78.0-100.0 fL SLN   MCH 28.2     26.0-34.0 pg SLN   MCHC 33.3     30.0-36.0 g/dL SLN   RDW 09.614.4     04.5-40.911.5-15.5 % SLN   Platelet Count 208 Assessment/Plan 1. Fever, unspecified -with occasional tachycardia noted, VSS currently -to cont VS q 8 hours -awaiting urine culture; UA is unremarkable -cbc WNL -will follow up cbc and bmp  -will get cxray -staff to encourage fluids  -cont as needed tylenol for temp

## 2013-05-31 ENCOUNTER — Other Ambulatory Visit: Payer: Self-pay | Admitting: *Deleted

## 2013-05-31 MED ORDER — ALPRAZOLAM 0.25 MG PO TABS: ORAL_TABLET | ORAL | Status: AC

## 2013-05-31 NOTE — Telephone Encounter (Signed)
[facility] 

## 2013-06-23 ENCOUNTER — Non-Acute Institutional Stay (SKILLED_NURSING_FACILITY): Payer: Medicare Other | Admitting: Nurse Practitioner

## 2013-06-23 DIAGNOSIS — I69359 Hemiplegia and hemiparesis following cerebral infarction affecting unspecified side: Secondary | ICD-10-CM

## 2013-06-23 DIAGNOSIS — E785 Hyperlipidemia, unspecified: Secondary | ICD-10-CM

## 2013-06-23 DIAGNOSIS — I69959 Hemiplegia and hemiparesis following unspecified cerebrovascular disease affecting unspecified side: Secondary | ICD-10-CM

## 2013-06-23 DIAGNOSIS — F0150 Vascular dementia without behavioral disturbance: Secondary | ICD-10-CM

## 2013-06-23 DIAGNOSIS — I10 Essential (primary) hypertension: Secondary | ICD-10-CM

## 2013-06-23 NOTE — Progress Notes (Signed)
Patient ID: Tracie [name], female   DOB: 12-07-1928, 80 y.o.   MRN: [id number]    Nursing Home Location:  [facility]   Place of Service: SNF (31)  PCP: Kimber Relic, MD  No Known Allergies  Chief Complaint  Patient presents with  . Medical Managment of Chronic Issues    HPI:  80 year old female with a pmh of advanced dementia, CVA with RT sided hemiparesis, htn who is being seen today for routine follow up on chronic conditions, pt had increase in HR and temp last month but further workup did not reveal cause and pt has been stable since without any ongoing issues.   Review of Systems:  Unable to obtain due to dementia  Past Medical History  Diagnosis Date  . Stroke   . Dementia   . Hypertension   . Laceration of skin of eyelid and periocular area   . Other fall   . Wheelchair dependence   . Hypopotassemia   . Encounters for unspecified administrative purpose   . Other and unspecified hyperlipidemia   . Reflux esophagitis   . Blood in stool   . Routine general medical examination at a health care facility   . Hypopotassemia   . Dementia in conditions classified elsewhere with behavioral disturbance   . Dementia in conditions classified elsewhere without behavioral disturbance   . Loss of weight   . Cough   . Unspecified vitamin D deficiency   . Thrombocytopenia, unspecified   . Unspecified disorder of kidney and ureter   . Other and unspecified hyperlipidemia   . Senile dementia, uncomplicated   . Anxiety state, unspecified   . Unspecified essential hypertension   . Unspecified late effects of cerebrovascular disease   . Other malaise and fatigue   . Abnormality of gait    Past Surgical History  Procedure Laterality Date  . Joint replacement  2008    Dr Renae Fickle  . Abdominal hysterectomy  1981   Social History:   reports that she has never smoked. She has never used smokeless tobacco. She reports that she does not drink alcohol or use illicit  drugs.  No family history on file.  Medications: Patient's Medications  New Prescriptions   No medications on file  Previous Medications   ALPRAZOLAM (XANAX) 0.25 MG TABLET    Take one tablet by mouth every 8 hours as needed for anixety   ASPIRIN 81 MG CHEWABLE TABLET    Chew 81 mg by mouth daily.   ATORVASTATIN (LIPITOR) 20 MG TABLET    Take 20 mg by mouth daily.   CHOLECALCIFEROL 2000 UNITS TABS    Take 2,000 tablets by mouth daily.   FEEDING SUPPLEMENT (ENSURE) PUDG    Take 1 Container by mouth daily.   FEEDING SUPPLEMENT (GLUCERNA SHAKE) LIQD    Take 237 mLs by mouth 2 (two) times daily between meals.   LISINOPRIL (PRINIVIL,ZESTRIL) 10 MG TABLET    Take 10 mg by mouth daily.   MAGNESIUM HYDROXIDE (MILK OF MAGNESIA) 800 MG/5ML SUSPENSION    Take 30 mLs by mouth daily as needed for constipation.   MEMANTINE HCL ER 28 MG CP24    Take 28 mg by mouth daily. to help preserve memory.   SODIUM PHOSPHATES (RA SALINE ENEMA RE)    Place rectally as needed.  Modified Medications   No medications on file  Discontinued Medications   No medications on file     Physical Exam:  Ceasar Mons  Vitals:   06/23/13 1649  BP: 152/84  Pulse: 85  Temp: 99 F (37.2 C)  Resp: 20    Physical Exam  Vitals reviewed. Constitutional: Vital signs are normal.  Thin female in NAD  HENT:  Head: Normocephalic and atraumatic.  Cardiovascular: Normal rate, regular rhythm and normal heart sounds.   Pulmonary/Chest: Effort normal and breath sounds normal.  Abdominal: Soft. Bowel sounds are normal. She exhibits no distension.  Musculoskeletal: She exhibits no edema and no tenderness.  Neurological: She is alert.  Skin: Skin is warm and dry.  Psychiatric:  nonverbal     Labs reviewed: Basic Metabolic Panel:  Recent Labs  16/12/9602/21/14 0605 07/13/12 1555 07/14/12 0700  NA 155* 144 143  K 3.4* 4.5 3.7  CL 119* 108 107  CO2 27 26 28   GLUCOSE 140* 176* 153*  BUN 18 17 13   CREATININE 0.70 0.68 0.62    CALCIUM 8.5 8.5 8.5   Liver Function Tests:  Recent Labs  07/08/12 1757 07/12/12 2137  AST 19 17  ALT 13 14  ALKPHOS 130* 108  BILITOT 0.5 0.4  PROT 6.4 6.6  ALBUMIN  --  2.9*   No results found for this basename: LIPASE, AMYLASE,  in the last 8760 hours No results found for this basename: AMMONIA,  in the last 8760 hours CBC:  Recent Labs  07/08/12 1757 07/12/12 2137 07/13/12 0605 07/14/12 0700  WBC 8.5 10.9* 9.1 9.1  NEUTROABS 5.8 8.5*  --   --   HGB 13.0 12.3 10.6* 10.6*  HCT 41.3 38.1 33.4* 32.2*  MCV 89 89.9 88.8 86.6  PLT  --  124* 129* 140*    CBC NO Diff (Complete Blood Count)  Result: 05/27/2013 9:31 PM ( Status: F )  WBC 8.2 4.0-10.5 K/uL SLN  RBC 4.39 3.87-5.11 MIL/uL SLN  Hemoglobin 12.4 12.0-15.0 g/dL SLN  Hematocrit 04.537.2 40.9-81.136.0-46.0 % SLN  MCV 84.7 78.0-100.0 fL SLN  MCH 28.2 26.0-34.0 pg SLN  MCHC 33.3 30.0-36.0 g/dL SLN  RDW 91.414.4 78.2-95.611.5-15.5 % SLN  Platelet Count 208 CBC NO Diff (Complete Blood Count)    Result: 04/28/2013 12:11 PM   ( Status: F )     C WBC 4.3     4.0-10.5 K/uL SLN   RBC 4.11     3.87-5.11 MIL/uL SLN   Hemoglobin 11.3   L 12.0-15.0 g/dL SLN   Hematocrit 21.335.3   L 36.0-46.0 % SLN   MCV 85.9     78.0-100.0 fL SLN   MCH 27.5     26.0-34.0 pg SLN   MCHC 32.0     30.0-36.0 g/dL SLN   RDW 08.613.9     57.8-46.911.5-15.5 % SLN   Platelet Count 151     150-400 K/uL SLN   Comprehensive Metabolic Panel    Result: 04/28/2013 12:06 PM   ( Status: F )       Sodium 143     135-145 mEq/L SLN   Potassium 4.0     3.5-5.3 mEq/L SLN   Chloride 108     96-112 mEq/L SLN   CO2 25     19-32 mEq/L SLN   Glucose 96     70-99 mg/dL SLN   BUN 16     6-296-23 mg/dL SLN   Creatinine 5.280.68     0.50-1.10 mg/dL SLN   Bilirubin, Total 0.3     0.2-1.2 mg/dL SLN C Alkaline Phosphatase 79     39-117 U/L SLN   AST/SGOT 16  0-37 U/L SLN   ALT/SGPT 15     0-35 U/L SLN   Total Protein 6.1     6.0-8.3 g/dL SLN   Albumin 3.6     1.6-1.0 g/dL SLN   Calcium 9.0      8.4-10.5 mg/dL SLN   Vitamin D (96-EAVWUJW)    Result: 04/28/2013 1:29 PM   ( Status: F )       Vitamin D (25-Hydroxy) 45     30-89 ng/mL SLN C  Assessment/Plan 1. Essential hypertension, benign -blood pressure borderline elevated, conts lisinopril  2. Vascular dementia without behavioral disturbance -remains stable in current environment; conts on namenda  3. Other and unspecified hyperlipidemia -conts on lipitor  4. CVA, old, hemiparesis -stable, conts on ASA

## 2013-08-06 ENCOUNTER — Non-Acute Institutional Stay (SKILLED_NURSING_FACILITY): Payer: Medicare Other | Admitting: Nurse Practitioner

## 2013-08-06 DIAGNOSIS — R251 Tremor, unspecified: Secondary | ICD-10-CM

## 2013-08-06 DIAGNOSIS — R259 Unspecified abnormal involuntary movements: Secondary | ICD-10-CM

## 2013-08-06 DIAGNOSIS — E785 Hyperlipidemia, unspecified: Secondary | ICD-10-CM

## 2013-08-06 DIAGNOSIS — I69359 Hemiplegia and hemiparesis following cerebral infarction affecting unspecified side: Secondary | ICD-10-CM

## 2013-08-06 DIAGNOSIS — I69959 Hemiplegia and hemiparesis following unspecified cerebrovascular disease affecting unspecified side: Secondary | ICD-10-CM

## 2013-08-06 DIAGNOSIS — F0150 Vascular dementia without behavioral disturbance: Secondary | ICD-10-CM

## 2013-08-06 DIAGNOSIS — I10 Essential (primary) hypertension: Secondary | ICD-10-CM

## 2013-08-06 NOTE — Progress Notes (Signed)
Patient ID: Tracie [name], female   DOB: 07/18/1928, 80 y.o.   MRN: [id number]    Nursing Home Location:  [facility]   Place of Service: SNF (31)  PCP: Kimber RelicGREEN, ARTHUR G, MD  No Known Allergies  Chief Complaint  Patient presents with  . Medical Management of Chronic Issues    HPI:  80 year old female with a pmh of advanced dementia, CVA with RT sided hemiparesis, htn who is being seen today for routine follow up on chronic conditions, pt has been stable in the last month without any changes to chronic conditions. Blood pressure elevated earlier but has improved. On review blood pressures are labile.  Review of Systems:  Review of Systems  Unable to perform ROS    Past Medical History  Diagnosis Date  . Stroke   . Dementia   . Hypertension   . Laceration of skin of eyelid and periocular area   . Other fall   . Wheelchair dependence   . Hypopotassemia   . Encounters for unspecified administrative purpose   . Other and unspecified hyperlipidemia   . Reflux esophagitis   . Blood in stool   . Routine general medical examination at a health care facility   . Hypopotassemia   . Dementia in conditions classified elsewhere with behavioral disturbance   . Dementia in conditions classified elsewhere without behavioral disturbance   . Loss of weight   . Cough   . Unspecified vitamin D deficiency   . Thrombocytopenia, unspecified   . Unspecified disorder of kidney and ureter   . Other and unspecified hyperlipidemia   . Senile dementia, uncomplicated   . Anxiety state, unspecified   . Unspecified essential hypertension   . Unspecified late effects of cerebrovascular disease   . Other malaise and fatigue   . Abnormality of gait    Past Surgical History  Procedure Laterality Date  . Joint replacement  2008    Dr Renae FicklePaul  . Abdominal hysterectomy  1981   Social History:   reports that she has never smoked. She has never used smokeless tobacco. She reports that  she does not drink alcohol or use illicit drugs.  No family history on file.  Medications: Patient's Medications  New Prescriptions   No medications on file  Previous Medications   ACETAMINOPHEN (TYLENOL) 325 MG TABLET    Take 650 mg by mouth every 6 (six) hours as needed for fever (greater than 101.0).   ALPRAZOLAM (XANAX) 0.25 MG TABLET    Take one tablet by mouth every 8 hours as needed for anixety   ASPIRIN 81 MG CHEWABLE TABLET    Chew 81 mg by mouth daily. For CVA   ATORVASTATIN (LIPITOR) 20 MG TABLET    Take 20 mg by mouth daily. For cholesterol   CHOLECALCIFEROL 2000 UNITS TABS    Take 2,000 tablets by mouth daily. For calcium supplement   FEEDING SUPPLEMENT (ENSURE) PUDG    Take 1 Container by mouth daily.   FEEDING SUPPLEMENT (GLUCERNA SHAKE) LIQD    Take 237 mLs by mouth 2 (two) times daily between meals.   LISINOPRIL (PRINIVIL,ZESTRIL) 10 MG TABLET    Take 10 mg by mouth daily. For HTN   MAGNESIUM HYDROXIDE (MILK OF MAGNESIA) 800 MG/5ML SUSPENSION    Take 30 mLs by mouth daily as needed for constipation.   MEMANTINE HCL ER 28 MG CP24    Take 28 mg by mouth daily. to help preserve memory.  SODIUM PHOSPHATES (RA SALINE ENEMA RE)    Place rectally as needed.  Modified Medications   No medications on file  Discontinued Medications   No medications on file     Physical Exam:  Filed Vitals:   08/06/13 1312  BP: 152/75  Pulse: 86  Temp: 97 F (36.1 C)  Resp: 20  Weight: 107 lb (48.535 kg)   Physical Exam  Vitals reviewed. Constitutional: Vital signs are normal.  Thin female in NAD  HENT:  Head: Normocephalic and atraumatic.  Cardiovascular: Normal rate, regular rhythm and normal heart sounds.   Pulmonary/Chest: Effort normal and breath sounds normal.  Abdominal: Soft. Bowel sounds are normal. She exhibits no distension.  Musculoskeletal: She exhibits no edema and no tenderness.  Neurological: She is alert.  Contracture to right hand  Skin: Skin is warm and dry.   Psychiatric:  nonverbal      Labs reviewed: CBC NO Diff (Complete Blood Count)  Result: 05/27/2013 9:31 PM ( Status: F )  WBC 8.2 4.0-10.5 K/uL SLN  RBC 4.39 3.87-5.11 MIL/uL SLN  Hemoglobin 12.4 12.0-15.0 g/dL SLN  Hematocrit 16.137.2 09.6-04.536.0-46.0 % SLN  MCV 84.7 78.0-100.0 fL SLN  MCH 28.2 26.0-34.0 pg SLN  MCHC 33.3 30.0-36.0 g/dL SLN  RDW 40.914.4 81.1-91.411.5-15.5 % SLN  Platelet Count 208  CBC NO Diff (Complete Blood Count)  Result: 04/28/2013 12:11 PM ( Status: F ) C  WBC 4.3 4.0-10.5 K/uL SLN  RBC 4.11 3.87-5.11 MIL/uL SLN  Hemoglobin 11.3 L 12.0-15.0 g/dL SLN  Hematocrit 78.235.3 L 36.0-46.0 % SLN  MCV 85.9 78.0-100.0 fL SLN  MCH 27.5 26.0-34.0 pg SLN  MCHC 32.0 30.0-36.0 g/dL SLN  RDW 95.613.9 21.3-08.611.5-15.5 % SLN  Platelet Count 151 150-400 K/uL SLN  Comprehensive Metabolic Panel  Result: 04/28/2013 12:06 PM ( Status: F )  Sodium 143 135-145 mEq/L SLN  Potassium 4.0 3.5-5.3 mEq/L SLN  Chloride 108 96-112 mEq/L SLN  CO2 25 19-32 mEq/L SLN  Glucose 96 70-99 mg/dL SLN  BUN 16 5-786-23 mg/dL SLN  Creatinine 4.690.68 6.29-5.280.50-1.10 mg/dL SLN  Bilirubin, Total 0.3 0.2-1.2 mg/dL SLN C  Alkaline Phosphatase 79 39-117 U/L SLN  AST/SGOT 16 0-37 U/L SLN  ALT/SGPT 15 0-35 U/L SLN  Total Protein 6.1 6.0-8.3 g/dL SLN  Albumin 3.6 4.1-3.23.5-5.2 g/dL SLN  Calcium 9.0 4.4-01.08.4-10.5 mg/dL SLN  Vitamin D (27-[id number]-Hydroxy)  Result: 04/28/2013 1:29 PM ( Status: F )  Vitamin D (25-Hydroxy) 45 30-89 ng/mL SLN C   Assessment/Plan 1. Essential hypertension, benign -on recheck blood pressure improved to sbp 124. Will cont current medication  2. CVA, old, hemiparesis -without change. conts ASA  3. Vascular dementia without behavioral disturbance -doing well in current environment, no significant changes   4. Tremor No noted episodes  5. Other and unspecified hyperlipidemia -conts on lipitor

## 2013-08-30 LAB — BASIC METABOLIC PANEL
BUN: 12 mg/dL (ref 4–21)
CREATININE: 0.7 mg/dL (ref <=1.1)
Glucose: 111 mg/dL
POTASSIUM: 3.9 mmol/L (ref 3.4–5.3)
SODIUM: 138 mmol/L (ref 137–147)

## 2013-09-10 ENCOUNTER — Non-Acute Institutional Stay (SKILLED_NURSING_FACILITY): Payer: Medicare Other | Admitting: Nurse Practitioner

## 2013-09-10 DIAGNOSIS — I69959 Hemiplegia and hemiparesis following unspecified cerebrovascular disease affecting unspecified side: Secondary | ICD-10-CM

## 2013-09-10 DIAGNOSIS — I10 Essential (primary) hypertension: Secondary | ICD-10-CM

## 2013-09-10 DIAGNOSIS — R627 Adult failure to thrive: Secondary | ICD-10-CM

## 2013-09-10 DIAGNOSIS — I69359 Hemiplegia and hemiparesis following cerebral infarction affecting unspecified side: Secondary | ICD-10-CM

## 2013-09-10 DIAGNOSIS — F0150 Vascular dementia without behavioral disturbance: Secondary | ICD-10-CM

## 2013-09-10 NOTE — Progress Notes (Signed)
Patient ID: Tracie [name], female   DOB: Mar 20, 1929, 80 y.o.   MRN: [id number]    Nursing Home Location:  [facility] Service: SN[facility] (31)  PCP: Estill Dooms, MD  No Known Allergies  Chief Complaint  Patient presents with  . Medical Management of Chronic Issues    Routine Visit     HPI:  80 year old female with a pmh of advanced dementia, CVA with RT sided hemiparesis, htn who is being seen today for routine follow up on chronic conditions, pt has been stable in the last month however family reports pt is now pocketing food when eating. Pt with weight loss from last month. No changes in medications noted. No fever chills, changes in pulmonary status, cough or congestion. Blood pressures overall have improved.    Review of Systems:  Review of Systems  Unable to perform ROS     Past Medical History  Diagnosis Date  . Stroke   . Dementia   . Hypertension   . Laceration of skin of eyelid and periocular area   . Other fall   . Wheelchair dependence   . Hypopotassemia   . Encounters for unspecified administrative purpose   . Other and unspecified hyperlipidemia   . Reflux esophagitis   . Blood in stool   . Routine general medical examination at a health care facility   . Hypopotassemia   . Dementia in conditions classified elsewhere with behavioral disturbance   . Dementia in conditions classified elsewhere without behavioral disturbance   . Loss of weight   . Cough   . Unspecified vitamin D deficiency   . Thrombocytopenia, unspecified   . Unspecified disorder of kidney and ureter   . Other and unspecified hyperlipidemia   . Senile dementia, uncomplicated   . Anxiety state, unspecified   . Unspecified essential hypertension   . Unspecified late effects of cerebrovascular disease   . Other malaise and fatigue   . Abnormality of gait    Past Surgical History  Procedure Laterality Date  . Joint replacement  2008    Dr Eddie Dibbles  . Abdominal  hysterectomy  1981   Social History:   reports that she has never smoked. She has never used smokeless tobacco. She reports that she does not drink alcohol or use illicit drugs.  No family history on file.  Medications: Patient's Medications  New Prescriptions   No medications on file  Previous Medications   ACETAMINOPHEN (TYLENOL) 325 MG TABLET    Take 650 mg by mouth every 6 (six) hours as needed for fever (greater than 101.0).   ACETAMINOPHEN (TYLENOL) 650 MG CR TABLET    Take 650 mg by mouth every 8 (eight) hours as needed for pain.   ALPRAZOLAM (XANAX) 0.25 MG TABLET    Take one tablet by mouth every 8 hours as needed for anixety   ASPIRIN 81 MG CHEWABLE TABLET    Chew 81 mg by mouth daily. For CVA   ATORVASTATIN (LIPITOR) 20 MG TABLET    Take 20 mg by mouth daily. For cholesterol   CHOLECALCIFEROL 2000 UNITS TABS    Take 2,000 tablets by mouth daily. For calcium supplement   LISINOPRIL (PRINIVIL,ZESTRIL) 10 MG TABLET    Take 10 mg by mouth daily. For HTN   MAGNESIUM HYDROXIDE (MILK OF MAGNESIA) 800 MG/5ML SUSPENSION    Take 30 mLs by mouth daily as needed for constipation.   MEMANTINE HCL ER 28 MG CP24  Take 28 mg by mouth daily. to help preserve memory.   SODIUM PHOSPHATES (RA SALINE ENEMA RE)    Place rectally as needed.  Modified Medications   No medications on file  Discontinued Medications   FEEDING SUPPLEMENT (ENSURE) PUDG    Take 1 Container by mouth daily.   FEEDING SUPPLEMENT (GLUCERNA SHAKE) LIQD    Take 237 mLs by mouth 2 (two) times daily between meals.     Physical Exam: Physical Exam  Vitals reviewed. Constitutional: Vital signs are normal.  Thin female in NAD  HENT:  Head: Normocephalic and atraumatic.  Does not open mouth  Cardiovascular: Normal rate, regular rhythm and normal heart sounds.   Pulmonary/Chest: Effort normal and breath sounds normal.  Abdominal: Soft. Bowel sounds are normal. She exhibits no distension.  Musculoskeletal: She exhibits no  edema and no tenderness.  Neurological: She is alert.  Contracture to right hand  Skin: Skin is warm and dry.  Psychiatric:  nonverbal     Filed Vitals:   09/10/13 1445  BP: 150/66  Pulse: 70  Temp: 97 F (36.1 C)  TempSrc: Oral  Resp: 18  Weight: 105 lb 12.8 oz (47.991 kg)      Labs reviewed: CBC NO Diff (Complete Blood Count)  Result: 05/27/2013 9:31 PM ( Status: F )  WBC 8.2 4.0-10.5 K/uL SLN  RBC 4.39 3.87-5.11 MIL/uL SLN  Hemoglobin 12.4 12.0-15.0 g/dL SLN  Hematocrit 37.2 36.0-46.0 % SLN  MCV 84.7 78.0-100.0 fL SLN  MCH 28.2 26.0-34.0 pg SLN  MCHC 33.3 30.0-36.0 g/dL SLN  RDW 14.4 11.5-15.5 % SLN  Platelet Count 208  CBC NO Diff (Complete Blood Count)  Result: 04/28/2013 12:11 PM ( Status: F ) C  WBC 4.3 4.0-10.5 K/uL SLN  RBC 4.11 3.87-5.11 MIL/uL SLN  Hemoglobin 11.3 L 12.0-15.0 g/dL SLN  Hematocrit 35.3 L 36.0-46.0 % SLN  MCV 85.9 78.0-100.0 fL SLN  MCH 27.5 26.0-34.0 pg SLN  MCHC 32.0 30.0-36.0 g/dL SLN  RDW 13.9 11.5-15.5 % SLN  Platelet Count 151 150-400 K/uL SLN  Comprehensive Metabolic Panel  Result: 08/25/3352 12:06 PM ( Status: F )  Sodium 143 135-145 mEq/L SLN  Potassium 4.0 3.5-5.3 mEq/L SLN  Chloride 108 96-112 mEq/L SLN  CO2 25 19-32 mEq/L SLN  Glucose 96 70-99 mg/dL SLN  BUN 16 6-23 mg/dL SLN  Creatinine 0.68 0.50-1.10 mg/dL SLN  Bilirubin, Total 0.3 0.2-1.2 mg/dL SLN C  Alkaline Phosphatase 79 39-117 U/L SLN  AST/SGOT 16 0-37 U/L SLN  ALT/SGPT 15 0-35 U/L SLN  Total Protein 6.1 6.0-8.3 g/dL SLN  Albumin 3.6 3.5-5.2 g/dL SLN  Calcium 9.0 8.4-10.5 mg/dL SLN  Vitamin D (25-Hydroxy)  Result: 04/28/2013 1:29 PM ( Status: F )  Vitamin D (25-Hydroxy) 45 30-89 ng/mL SLN C CBC with Diff    Result: 05/30/2013 5:49 PM   ( Status: F )       WBC 5.7     4.0-10.5 K/uL SLN   RBC 4.03     3.87-5.11 MIL/uL SLN   Hemoglobin 11.0   L 12.0-15.0 g/dL SLN   Hematocrit 33.3   L 36.0-46.0 % SLN   MCV 82.6     78.0-100.0 fL SLN   MCH 27.3     26.0-34.0 pg SLN    MCHC 33.0     30.0-36.0 g/dL SLN   RDW 14.5     11.5-15.5 % SLN   Platelet Count 173     150-400 K/uL SLN   Granulocyte % 55  43-77 % SLN   Absolute Gran 3.1     1.7-7.7 K/uL SLN   Lymph % 33     12-46 % SLN   Absolute Lymph 1.9     0.7-4.0 K/uL SLN   Mono % 10     3-12 % SLN   Absolute Mono 0.6     0.1-1.0 K/uL SLN   Eos % 2     0-5 % SLN   Absolute Eos 0.1     0.0-0.7 K/uL SLN   Baso % 0     0-1 % SLN   Absolute Baso 0.0     0.0-0.1 K/uL SLN   Smear Review Criteria for review not met  SLN   Basic Metabolic Panel    Result: 05/30/2013 5:03 PM   ( Status: F )       Sodium 145     135-145 mEq/L SLN   Potassium 3.9     3.5-5.3 mEq/L SLN   Chloride 109     96-112 mEq/L SLN   CO2 26     19-32 mEq/L SLN   Glucose 98     70-99 mg/dL SLN   BUN 19     6-23 mg/dL SLN   Creatinine 0.83     0.50-1.10 mg/dL SLN   Calcium 8.4 Basic Metabolic Panel    Result: 08/30/2013 3:29 PM   ( Status: F )     C Sodium 138     135-145 mEq/L SLN   Potassium 3.9     3.5-5.3 mEq/L SLN   Chloride 103     96-112 mEq/L SLN   CO2 26     19-32 mEq/L SLN   Glucose 111   H 70-99 mg/dL SLN   BUN 12     6-23 mg/dL SLN   Creatinine 0.69     0.50-1.10 mg/dL SLN   Calcium 8.8     8.4-10.5 mg/dL SLN     Assessment/Plan 1. CVA, old, hemiparesis -conts to require braces due to contractures  -conts on asa  2. Vascular dementia without behavioral disturbance -progression of disease with difficulty swallowing and weight loss noted, conts on namenda -requiring total care by staff, conts with staff and family support  3. Essential hypertension, benign Some elevation in blood pressure but overall hypertension has improved, will cont current medications  4. FTT (failure to thrive) in adult -with noted weight loss from last month, conts on supplements, family notes pt pocketing food, will get ST involved at this time

## 2013-10-01 LAB — BASIC METABOLIC PANEL
BUN: 12 mg/dL (ref 4–21)
Creatinine: 0.7 mg/dL (ref 0.5–1.1)
Potassium: 3.9 mmol/L (ref 3.4–5.3)
Sodium: 138 mmol/L (ref 137–147)

## 2013-10-01 LAB — LIPID PANEL
CHOLESTEROL: 174 mg/dL (ref 0–200)
Cholesterol: 174 mg/dL (ref 0–200)
HDL: 54 mg/dL (ref 35–70)
HDL: 54 mg/dL (ref 35–70)
LDL CALC: 113 mg/dL
LDL Cholesterol: 113 mg/dL
LDL/HDL RATIO: 3.2
TRIGLYCERIDES: 35 mg/dL — AB (ref 40–160)

## 2013-10-01 LAB — CBC AND DIFFERENTIAL
HCT: 37 % (ref 36–46)
HEMATOCRIT: 37 % (ref 36–46)
HEMOGLOBIN: 12.1 g/dL (ref 12.0–16.0)
Hemoglobin: 12.1 g/dL (ref 12.0–16.0)
PLATELETS: 179 10*3/uL (ref 150–399)
Platelets: 179 10*3/uL (ref 150–399)
WBC: 4.5 10^3/mL
WBC: 4.5 10^3/mL

## 2013-10-01 LAB — HEPATIC FUNCTION PANEL
ALT: 9 U/L (ref 7–35)
AST: 15 U/L (ref 13–35)
Alkaline Phosphatase: 90 U/L (ref 25–125)
BILIRUBIN DIRECT: 0.1 mg/dL
BILIRUBIN, TOTAL: 0.5 mg/dL

## 2013-10-15 ENCOUNTER — Non-Acute Institutional Stay (SKILLED_NURSING_FACILITY): Payer: Medicare Other | Admitting: Nurse Practitioner

## 2013-10-15 ENCOUNTER — Encounter: Payer: Self-pay | Admitting: Nurse Practitioner

## 2013-10-15 DIAGNOSIS — I69959 Hemiplegia and hemiparesis following unspecified cerebrovascular disease affecting unspecified side: Secondary | ICD-10-CM

## 2013-10-15 DIAGNOSIS — F0150 Vascular dementia without behavioral disturbance: Secondary | ICD-10-CM

## 2013-10-15 DIAGNOSIS — I10 Essential (primary) hypertension: Secondary | ICD-10-CM

## 2013-10-15 DIAGNOSIS — R627 Adult failure to thrive: Secondary | ICD-10-CM

## 2013-10-15 DIAGNOSIS — I69359 Hemiplegia and hemiparesis following cerebral infarction affecting unspecified side: Secondary | ICD-10-CM

## 2013-10-15 NOTE — Progress Notes (Signed)
Patient ID: Tracie [name], female   DOB: 04-Sep-1928, 80 y.o.   MRN: [id number]  Location:[facility] Provider:  Candelaria Celeste, NP  Code Status:  DNR  Chief Complaint  Patient presents with  . Medical Management of Chronic Issues    \Patient ID: Tracie [name], female DOB: April 29, 1928, 80 y.o. MRN: [id number]   Nursing Home Location: [facility]  Place of Service: SNF (31)  PCP: Kimber Relic, MD  No Known Allergies  Chief Complaint   Patient presents with   .  Medical Management of Chronic Issues     Routine Visit   HPI:  80 year old female with a pmh of advanced dementia, CVA with RT sided hemiparesis, htn who is being seen today for routine follow up on chronic conditions, pt has been stable in the last month. Followed by ST. No fever chills, changes in pulmonary status, cough or congestion.  Review of Systems:  Unable to obtain due to dementia Medications: Patient's Medications  New Prescriptions   No medications on file  Previous Medications   ACETAMINOPHEN (TYLENOL) 325 MG TABLET    Take 650 mg by mouth every 6 (six) hours as needed for fever (greater than 101.0).   ACETAMINOPHEN (TYLENOL) 650 MG CR TABLET    Take 650 mg by mouth every 8 (eight) hours as needed for pain.   ALPRAZOLAM (XANAX) 0.25 MG TABLET    Take one tablet by mouth every 8 hours as needed for anixety   ASPIRIN 81 MG CHEWABLE TABLET    Chew 81 mg by mouth daily. For CVA   ATORVASTATIN (LIPITOR) 20 MG TABLET    Take 20 mg by mouth daily. For cholesterol   CHOLECALCIFEROL 2000 UNITS TABS    Take 2,000 tablets by mouth daily. For calcium supplement   LISINOPRIL (PRINIVIL,ZESTRIL) 10 MG TABLET    Take 10 mg by mouth daily. For HTN   MAGNESIUM HYDROXIDE (MILK OF MAGNESIA) 800 MG/5ML SUSPENSION    Take 30 mLs by mouth daily as needed for constipation.   MEMANTINE HCL ER 28 MG CP24    Take 28 mg by mouth daily. to help preserve memory.   SODIUM PHOSPHATES (RA SALINE ENEMA RE)    Place rectally as  needed.  Modified Medications   No medications on file  Discontinued Medications   No medications on file    Physical Exam: Filed Vitals:   10/15/13 0955  BP: 114/83  Pulse: 54  Temp: 98.5 F (36.9 C)  Resp: 18  Weight: 103 lb (46.72 kg)   Physical Exam  Vitals reviewed. Constitutional: Vital signs are normal.  Thin female in NAD  HENT:  Head: Normocephalic and atraumatic.  Eyes: Conjunctivae and EOM are normal. Pupils are equal, round, and reactive to light.  Neck: Normal range of motion. Neck supple. No thyromegaly present.  Cardiovascular: Normal rate, regular rhythm and normal heart sounds.   Pulmonary/Chest: Effort normal and breath sounds normal.  Abdominal: Soft. Bowel sounds are normal. She exhibits no distension.  Musculoskeletal: She exhibits no edema and no tenderness.  Neurological: She is alert.  Contracture to right hand  Skin: Skin is warm and dry.  Psychiatric:  nonverbal     Labs reviewed: Basic Metabolic Panel:  Recent Labs  78/29/56  NA 138  K 3.9  BUN 12  CREATININE 0.7    Liver Function Tests:  Recent Labs  10/01/13  AST 15  ALT 9  ALKPHOS 90    CBC:  Recent  Labs  10/01/13  WBC 4.5  HGB 12.1  HCT 37  PLT 179       Assessment/Plan 1. Essential hypertension, benign Stable at this time.  Labs updated  2. CVA, old, hemiparesis On asa without side effects conts with brace due to contractures, no signs of pain Labs reviewed, CBC stable   3. Vascular dementia without behavioral disturbance -conts on namenda, requiring total care   4. FTT (failure to thrive) in adult -conts with weight loss, expected due to progression of dementia -conts to be folowed by RD

## 2013-11-22 ENCOUNTER — Encounter: Payer: Self-pay | Admitting: Internal Medicine

## 2013-11-22 ENCOUNTER — Non-Acute Institutional Stay (SKILLED_NURSING_FACILITY): Payer: Medicare Other | Admitting: Internal Medicine

## 2013-11-22 DIAGNOSIS — F0150 Vascular dementia without behavioral disturbance: Secondary | ICD-10-CM

## 2013-11-22 DIAGNOSIS — E785 Hyperlipidemia, unspecified: Secondary | ICD-10-CM

## 2013-11-22 DIAGNOSIS — I69359 Hemiplegia and hemiparesis following cerebral infarction affecting unspecified side: Secondary | ICD-10-CM

## 2013-11-22 DIAGNOSIS — I10 Essential (primary) hypertension: Secondary | ICD-10-CM

## 2013-11-22 DIAGNOSIS — I69959 Hemiplegia and hemiparesis following unspecified cerebrovascular disease affecting unspecified side: Secondary | ICD-10-CM

## 2013-11-22 NOTE — Assessment & Plan Note (Signed)
continue namenda;no anxiety noted, calm and pleasantc

## 2013-11-22 NOTE — Assessment & Plan Note (Signed)
R hemi paresis;pt in wheelchair,functions fairly well

## 2013-11-22 NOTE — Assessment & Plan Note (Signed)
A little permissive on lisinopril 10 mg

## 2013-11-22 NOTE — Progress Notes (Signed)
MRN: [id number] Name: Tracie [name]  Sex: female Age: 80 y.o. DOB: 07/30/1928  [facility] #: [facility] Facility/Room: 221B Level Of Care: SNF Provider: Merrilee Seashore D Emergency Contacts: Extended Emergency Contact Information Primary Emergency Contact: Cart,Samuel Address: [address]          [place], [place] [address] [place] Home Phone: [phone] Mobile Phone: [phone] Relation: Spouse Secondary Emergency Contact: Pfahler,Kenneth Address: [address]          [place], [place] [address] [place] Home Phone: [phone] Relation: Son  Code Status: DNR  Allergies: Review of patient's allergies indicates no known allergies.  Chief Complaint  Patient presents with  . Medical Management of Chronic Issues    HPI: Patient is 80 y.o. female whose primary problem is dementia who is being seen for routine issues.   Past Medical History  Diagnosis Date  . Stroke   . Dementia   . Hypertension   . Laceration of skin of eyelid and periocular area   . Other fall   . Wheelchair dependence   . Hypopotassemia   . Encounters for unspecified administrative purpose   . Other and unspecified hyperlipidemia   . Reflux esophagitis   . Blood in stool   . Routine general medical examination at a health care facility   . Hypopotassemia   . Dementia in conditions classified elsewhere with behavioral disturbance   . Dementia in conditions classified elsewhere without behavioral disturbance   . Loss of weight   . Cough   . Unspecified vitamin D deficiency   . Thrombocytopenia, unspecified   . Unspecified disorder of kidney and ureter   . Other and unspecified hyperlipidemia   . Senile dementia, uncomplicated   . Anxiety state, unspecified   . Unspecified essential hypertension   . Unspecified late effects of cerebrovascular disease   . Other malaise and fatigue   . Abnormality of gait     Past Surgical History  Procedure Laterality Date  .  Joint replacement  2008    Dr Renae Fickle  . Abdominal hysterectomy  1981      Medication List       This list is accurate as of: 11/22/13  3:40 PM.  Always use your most recent med list.               acetaminophen 325 MG tablet  Commonly known as:  TYLENOL  Take 650 mg by mouth every 6 (six) hours as needed for fever (greater than 101.0).     acetaminophen 650 MG CR tablet  Commonly known as:  TYLENOL  Take 650 mg by mouth every 8 (eight) hours as needed for pain.     ALPRAZolam 0.25 MG tablet  Commonly known as:  XANAX  Take one tablet by mouth every 8 hours as needed for anixety     aspirin 81 MG chewable tablet  Chew 81 mg by mouth daily. For CVA     atorvastatin 20 MG tablet  Commonly known as:  LIPITOR  Take 20 mg by mouth daily. For cholesterol     Cholecalciferol 2000 UNITS Tabs  Take 2,000 tablets by mouth daily. For calcium supplement     lisinopril 10 MG tablet  Commonly known as:  PRINIVIL,ZESTRIL  Take 10 mg by mouth daily. For HTN     magnesium hydroxide 800 MG/5ML suspension  Commonly known as:  MILK OF MAGNESIA  Take 30 mLs by mouth daily as needed for constipation.  Memantine HCl ER 28 MG Cp24  Take 28 mg by mouth daily. to help preserve memory.     RA SALINE ENEMA RE  Place rectally as needed.        No orders of the defined types were placed in this encounter.    Immunization History  Administered Date(s) Administered  . Influenza-Unspecified 12/23/2012  . Pneumococcal Polysaccharide-23 07/15/2012    History  Substance Use Topics  . Smoking status: Never Smoker   . Smokeless tobacco: Never Used  . Alcohol Use: No    Review of Systems    UTO 2/2 dementia; nurses voice no concern    Filed Vitals:   11/22/13 1529  BP: 143/79  Pulse: 70  Temp: 97.9 F (36.6 C)  Resp: 18    Physical Exam  GENERAL APPEARANCE: Alert, minconversant. Appropriately groomed. No acute distress  SKIN: No diaphoresis rash HEENT:  Unremarkable RESPIRATORY: Breathing is even, unlabored. Lung sounds are clear   CARDIOVASCULAR: Heart RRR no murmurs, rubs or gallops. No peripheral edema  GASTROINTESTINAL: Abdomen is soft, non-tender, not distended w/ normal bowel sounds.  GENITOURINARY: Bladder non tender, not distended  MUSCULOSKELETAL: No abnormal joints or musculature NEUROLOGIC: Cranial nerves 2-12 grossly intact. Moves all extremities no tremor. PSYCHIATRIC: dementia, no behavioral issues  Patient Active Problem List   Diagnosis Date Noted  . Essential hypertension, benign 03/30/2013  . Other and unspecified hyperlipidemia 12/13/2012  . Vascular dementia without behavioral disturbance 12/13/2012  . Tremors of nervous system 11/11/2012  . Tremor 10/24/2012  . CVA, old, hemiparesis 07/13/2012  . FTT (failure to thrive) in adult 07/13/2012  . UTI (lower urinary tract infection) 07/13/2012  . Dehydration 07/13/2012  . Lethargy 07/12/2012  . Pressure ulcer, stage 2 07/12/2012  . Cerebral contusion 05/04/2012    CBC    Component Value Date/Time   WBC 4.5 10/01/2013   WBC 9.1 07/14/2012 0700   RBC 3.72* 07/14/2012 0700   RBC 4.65 07/08/2012 1757   HGB 12.1 10/01/2013   HCT 37 10/01/2013   PLT 179 10/01/2013   MCV 86.6 07/14/2012 0700   LYMPHSABS 1.7 07/12/2012 2137   LYMPHSABS 2.0 07/08/2012 1757   MONOABS 0.7 07/12/2012 2137   EOSABS 0.0 07/12/2012 2137   EOSABS 0.0 07/08/2012 1757   BASOSABS 0.0 07/12/2012 2137   BASOSABS 0.0 07/08/2012 1757    CMP     Component Value Date/Time   NA 138 08/30/2013   NA 143 07/14/2012 0700   K 3.9 08/30/2013   CL 107 07/14/2012 0700   CO2 28 07/14/2012 0700   GLUCOSE 153* 07/14/2012 0700   GLUCOSE 108* 07/08/2012 1757   BUN 12 08/30/2013   BUN 13 07/14/2012 0700   CREATININE 0.7 08/30/2013   CREATININE 0.62 07/14/2012 0700   CALCIUM 8.5 07/14/2012 0700   PROT 6.6 07/12/2012 2137   PROT 6.4 07/08/2012 1757   ALBUMIN 2.9* 07/12/2012 2137   AST 15 10/01/2013   ALT 9 10/01/2013   ALKPHOS  90 10/01/2013   BILITOT 0.4 07/12/2012 2137   GFRNONAA 82* 07/14/2012 0700   GFRAA >90 07/14/2012 0700    Assessment and Plan  Essential hypertension, benign A little permissive on lisinopril 10 mg  CVA, old, hemiparesis R hemi paresis;pt in wheelchair,functions fairly well  Vascular dementia without behavioral disturbance continue namenda;no anxiety noted, calm and pleasantc  Other and unspecified hyperlipidemia On lipitor 20 mg with LDL 113, HDL 54 in 09/2013    Margit Hanks, MD

## 2013-11-22 NOTE — Assessment & Plan Note (Signed)
On lipitor 20 mg with LDL 113, HDL 54 in 09/2013

## 2013-12-17 ENCOUNTER — Non-Acute Institutional Stay (SKILLED_NURSING_FACILITY): Payer: Medicare Other | Admitting: Nurse Practitioner

## 2013-12-17 DIAGNOSIS — R627 Adult failure to thrive: Secondary | ICD-10-CM

## 2013-12-17 DIAGNOSIS — R259 Unspecified abnormal involuntary movements: Secondary | ICD-10-CM

## 2013-12-17 DIAGNOSIS — I69959 Hemiplegia and hemiparesis following unspecified cerebrovascular disease affecting unspecified side: Secondary | ICD-10-CM

## 2013-12-17 DIAGNOSIS — F0150 Vascular dementia without behavioral disturbance: Secondary | ICD-10-CM

## 2013-12-17 DIAGNOSIS — I69359 Hemiplegia and hemiparesis following cerebral infarction affecting unspecified side: Secondary | ICD-10-CM

## 2013-12-17 DIAGNOSIS — R251 Tremor, unspecified: Secondary | ICD-10-CM

## 2013-12-17 DIAGNOSIS — I10 Essential (primary) hypertension: Secondary | ICD-10-CM

## 2013-12-17 NOTE — Progress Notes (Signed)
Patient ID: Tracie [name], female   DOB: [birth year], 80 y.o.   MRN: [id number] Patient ID: Tracie [name], female   DOB: Nov 04, 1928, 80 y.o.   MRN: [id number]   Location:[facility]  Code Status:  DNR  Chief Complaint  Patient presents with  . Medical Management of Chronic Issues   HPI:  79 year old female with a pmh of advanced dementia, CVA with RT sided hemiparesis, htn who is being seen today for routine follow up on chronic conditions. Worsening contractures, now with bilaterally hand braces. Weight has been stable in the last month. No acute issues in the last month   Review of Systems:  Unable to obtain due to dementia  Medications: Patient's Medications  New Prescriptions   No medications on file  Previous Medications   ACETAMINOPHEN (TYLENOL) 325 MG TABLET    Take 650 mg by mouth every 6 (six) hours as needed for fever (greater than 101.0).   ACETAMINOPHEN (TYLENOL) 650 MG CR TABLET    Take 650 mg by mouth every 8 (eight) hours as needed for pain.   ALPRAZOLAM (XANAX) 0.25 MG TABLET    Take one tablet by mouth every 8 hours as needed for anixety   ASPIRIN 81 MG CHEWABLE TABLET    Chew 81 mg by mouth daily. For CVA   ATORVASTATIN (LIPITOR) 20 MG TABLET    Take 20 mg by mouth daily. For cholesterol   CHOLECALCIFEROL 2000 UNITS TABS    Take 2,000 tablets by mouth daily. For calcium supplement   LISINOPRIL (PRINIVIL,ZESTRIL) 10 MG TABLET    Take 10 mg by mouth daily. For HTN   MAGNESIUM HYDROXIDE (MILK OF MAGNESIA) 800 MG/5ML SUSPENSION    Take 30 mLs by mouth daily as needed for constipation.   MEMANTINE HCL ER 28 MG CP24    Take 28 mg by mouth daily. to help preserve memory.   SODIUM PHOSPHATES (RA SALINE ENEMA RE)    Place rectally as needed.  Modified Medications   No medications on file  Discontinued Medications   No medications on file    Physical Exam: Filed Vitals:   12/17/13 1306  BP: 141/70  Pulse: 70  Temp: 97.1 F (36.2 C)  Resp: 20  Weight: 104 lb (47.174  kg)   Physical Exam  Vitals reviewed. Constitutional: Vital signs are normal.  Thin female in NAD  HENT:  Head: Normocephalic and atraumatic.  Neck: Normal range of motion. Neck supple.  Cardiovascular: Normal rate, regular rhythm and normal heart sounds.   Pulmonary/Chest: Effort normal and breath sounds normal.  Abdominal: Soft. Bowel sounds are normal. She exhibits no distension.  Musculoskeletal: She exhibits no edema and no tenderness.  Neurological: She is alert. She exhibits abnormal muscle tone.  Contracture to right hand  Skin: Skin is warm and dry.  Psychiatric:  nonverbal     Labs reviewed: Basic Metabolic Panel:  Recent Labs  78/29/56  NA 138  K 3.9  BUN 12  CREATININE 0.7    Liver Function Tests:  Recent Labs  10/01/13  AST 15  ALT 9  ALKPHOS 90    CBC:  Recent Labs  10/01/13  WBC 4.5  HGB 12.1  HCT 37  PLT 179       Assessment/Plan  1. Tremor Without recurrence.   2. Vascular dementia without behavioral disturbance -advanced. Contractures to bilateral hands working with OT  3. CVA, old, hemiparesis conts asa  4. Essential hypertension, benign -blood pressure stable, conts on lisinopril  5. FTT (failure to thrive) in adult -pt with advanced dementia, weight has been down without gain -staff assisting resident with ADLs

## 2014-01-07 ENCOUNTER — Non-Acute Institutional Stay (SKILLED_NURSING_FACILITY): Payer: Medicare Other | Admitting: Nurse Practitioner

## 2014-01-07 DIAGNOSIS — R627 Adult failure to thrive: Secondary | ICD-10-CM

## 2014-01-07 DIAGNOSIS — I69359 Hemiplegia and hemiparesis following cerebral infarction affecting unspecified side: Secondary | ICD-10-CM

## 2014-01-07 DIAGNOSIS — I10 Essential (primary) hypertension: Secondary | ICD-10-CM

## 2014-01-07 DIAGNOSIS — E785 Hyperlipidemia, unspecified: Secondary | ICD-10-CM

## 2014-01-07 DIAGNOSIS — F0150 Vascular dementia without behavioral disturbance: Secondary | ICD-10-CM

## 2014-01-07 NOTE — Progress Notes (Signed)
Patient ID: Patrina LeveringHattie K Schnider, female   DOB: 11/06/1928, 80 y.o.   MRN: [id number]   Location:[facility]  Code Status:  DNR  Chief Complaint  Patient presents with  . Medical Management of Chronic Issues   HPI:  80 year old female with a pmh of advanced dementia, CVA with RT sided hemiparesis, htn who is being seen today for routine follow up on chronic conditions. No falls, weight stable over past 3 mos. No change in appetite or bowel habits. No skin issues. No concerns reported from nursing staff.   Review of Systems:  Unable to obtain due to dementia  Medications: Patient's Medications  New Prescriptions   No medications on file  Previous Medications   ACETAMINOPHEN (TYLENOL) 325 MG TABLET    Take 650 mg by mouth every 6 (six) hours as needed for fever (greater than 101.0).   ACETAMINOPHEN (TYLENOL) 650 MG CR TABLET    Take 650 mg by mouth every 8 (eight) hours as needed for pain.   ALPRAZOLAM (XANAX) 0.25 MG TABLET    Take one tablet by mouth every 8 hours as needed for anixety   ASPIRIN 81 MG CHEWABLE TABLET    Chew 81 mg by mouth daily. For CVA   ATORVASTATIN (LIPITOR) 20 MG TABLET    Take 20 mg by mouth daily. For cholesterol   CHOLECALCIFEROL 2000 UNITS TABS    Take 2,000 tablets by mouth daily. For calcium supplement   LISINOPRIL (PRINIVIL,ZESTRIL) 10 MG TABLET    Take 10 mg by mouth daily. For HTN   MAGNESIUM HYDROXIDE (MILK OF MAGNESIA) 800 MG/5ML SUSPENSION    Take 30 mLs by mouth daily as needed for constipation.   MEMANTINE HCL ER 28 MG CP24    Take 28 mg by mouth daily. to help preserve memory.   SODIUM PHOSPHATES (RA SALINE ENEMA RE)    Place rectally as needed.  Modified Medications   No medications on file  Discontinued Medications   No medications on file    Physical Exam: Filed Vitals:   01/07/14 1435  BP: 133/67  Pulse: 75  Temp: 97.5 F (36.4 C)  Resp: 20  Weight: 103 lb (46.72 kg)   Physical Exam  Vitals reviewed. Constitutional: Vital signs are  normal.  Thin female in NAD  HENT:  Head: Normocephalic and atraumatic.  Neck: Normal range of motion. Neck supple.  Cardiovascular: Normal rate, regular rhythm, normal heart sounds and intact distal pulses.   Pulmonary/Chest: Effort normal and breath sounds normal.  Abdominal: Soft. Bowel sounds are normal. She exhibits no distension. There is no tenderness.  Musculoskeletal: She exhibits no edema and no tenderness.  Neurological: She is alert. She exhibits abnormal muscle tone.  Contracture to both hands  Skin: Skin is warm and dry.  Psychiatric:  nonverbal     Labs reviewed: Basic Metabolic Panel:  Recent Labs  40/98/1104/11/06 10/01/13  NA 138 138  K 3.9 3.9  BUN 12 12  CREATININE 0.7 0.7    Liver Function Tests:  Recent Labs  10/01/13  AST 15  ALT 9  ALKPHOS 90    CBC:  Recent Labs  10/01/13  WBC 4.5  4.5  HGB 12.1  12.1  HCT 37  37  PLT 179  179       Assessment/Plan  1. Essential hypertension, benign Stable. Continue lisinopril 10mg  PO QD. Continue to monitor BP  2. CVA, old, hemiparesis Persists. Continue ASA 81mg  PO QD. Continue to to work with restorative.  3. Vascular dementia without behavioral disturbance Advanced. Continue Namenda XR 28mg  PO Qd.   4. FTT (failure to thrive) in adult Stable overall. Total protein and albumin on 10/01/13 were within normal limits. Continue assistance with ADLs and dietary recommendations.  5. Hyperlipidemia Stable. Last LDL 113 on 10/01/13. Continue Lipitor 20mg  PO QHS.

## 2014-02-07 ENCOUNTER — Non-Acute Institutional Stay (SKILLED_NURSING_FACILITY): Payer: Medicare Other | Admitting: Internal Medicine

## 2014-02-07 DIAGNOSIS — I10 Essential (primary) hypertension: Secondary | ICD-10-CM

## 2014-02-07 DIAGNOSIS — D638 Anemia in other chronic diseases classified elsewhere: Secondary | ICD-10-CM

## 2014-02-07 DIAGNOSIS — F0150 Vascular dementia without behavioral disturbance: Secondary | ICD-10-CM

## 2014-02-07 DIAGNOSIS — I69359 Hemiplegia and hemiparesis following cerebral infarction affecting unspecified side: Secondary | ICD-10-CM

## 2014-02-07 DIAGNOSIS — E785 Hyperlipidemia, unspecified: Secondary | ICD-10-CM

## 2014-02-07 NOTE — Progress Notes (Signed)
MRN: [id number] Name: Tracie [name]  Sex: female Age: 80 y.o. DOB: 01/11/1929  [facility] Facility[facility]Room: 221B Level Of Care: SNF Provider: Merrilee SeashoreALEXANDER, Lorilei Horan D Emergency Contacts: Extended Emergency Contact Information Primary Emergency Contact: Zweig,Samuel Address: [address]          [place], [place] [address] [place] Home Phone: [phone] Mobile Phone: [phone] Relation: Spouse Secondary Emergency Contact: Brucato,Kenneth Address: [address]          [place], [place] [address] [place] Home Phone: [phone] Relation: Son  Code Status: DNR  Allergies: Review of patient's allergies indicates no known allergies.  Chief Complaint  Patient presents with  . Medical Management of Chronic Issues    HPI: Patient is 80 y.o. female who is being seen for routine issues.  Past Medical History  Diagnosis Date  . Stroke   . Dementia   . Hypertension   . Laceration of skin of eyelid and periocular area   . Other fall   . Wheelchair dependence   . Hypopotassemia   . Encounters for unspecified administrative purpose   . Other and unspecified hyperlipidemia   . Reflux esophagitis   . Blood in stool   . Routine general medical examination at a health care facility   . Hypopotassemia   . Dementia in conditions classified elsewhere with behavioral disturbance   . Dementia in conditions classified elsewhere without behavioral disturbance   . Loss of weight   . Cough   . Unspecified vitamin D deficiency   . Thrombocytopenia, unspecified   . Unspecified disorder of kidney and ureter   . Other and unspecified hyperlipidemia   . Senile dementia, uncomplicated   . Anxiety state, unspecified   . Unspecified essential hypertension   . Unspecified late effects of cerebrovascular disease   . Other malaise and fatigue   . Abnormality of gait     Past Surgical History  Procedure Laterality Date  . Joint replacement  2008    Dr Renae FicklePaul   . Abdominal hysterectomy  1981      Medication List       This list is accurate as of: 02/07/14 11:59 PM.  Always use your most recent med list.               acetaminophen 325 MG tablet  Commonly known as:  TYLENOL  Take 650 mg by mouth every 6 (six) hours as needed for fever (greater than 101.0).     acetaminophen 650 MG CR tablet  Commonly known as:  TYLENOL  Take 650 mg by mouth every 8 (eight) hours as needed for pain.     ALPRAZolam 0.25 MG tablet  Commonly known as:  XANAX  Take one tablet by mouth every 8 hours as needed for anixety     aspirin 81 MG chewable tablet  Chew 81 mg by mouth daily. For CVA     atorvastatin 20 MG tablet  Commonly known as:  LIPITOR  Take 20 mg by mouth daily. For cholesterol     Cholecalciferol 2000 UNITS Tabs  Take 2,000 tablets by mouth daily. For calcium supplement     lisinopril 10 MG tablet  Commonly known as:  PRINIVIL,ZESTRIL  Take 10 mg by mouth daily. For HTN     magnesium hydroxide 800 MG/5ML suspension  Commonly known as:  MILK OF MAGNESIA  Take 30 mLs by mouth daily as needed for constipation.     Memantine HCl ER 28 MG Cp24  Take 28 mg by mouth daily. to help preserve memory.     RA SALINE ENEMA RE  Place rectally as needed.        No orders of the defined types were placed in this encounter.    Immunization History  Administered Date(s) Administered  . Influenza-Unspecified 12/23/2012  . Pneumococcal Polysaccharide-23 07/15/2012    History  Substance Use Topics  . Smoking status: Never Smoker   . Smokeless tobacco: Never Used  . Alcohol Use: No    Review of Systems   UTO;nurse voices no concern  Filed Vitals:   02/07/14 1532  BP: 161/79  Pulse: 70  Temp: 96.5 F (35.8 C)  Resp: 20    Physical Exam  GENERAL APPEARANCE: Alert, minconversant, No acute distress  SKIN: No diaphoresis rash HEENT: Unremarkable RESPIRATORY: Breathing is even, unlabored. Lung sounds are clear    CARDIOVASCULAR: Heart RRR no murmurs, rubs or gallops. No peripheral edema  GASTROINTESTINAL: Abdomen is soft, non-tender, not distended w/ normal bowel sounds.  GENITOURINARY: Bladder non tender, not distended  MUSCULOSKELETAL: No abnormal joints or musculature NEUROLOGIC: Cranial nerves 2-12 grossly intact. Moves all extremities PSYCHIATRIC: dementia, no behavioral issues  Patient Active Problem List   Diagnosis Date Noted  . Anemia 02/12/2014  . Essential hypertension, benign 03/30/2013  . Hyperlipidemia 12/13/2012  . Vascular dementia without behavioral disturbance 12/13/2012  . Tremors of nervous system 11/11/2012  . Tremor 10/24/2012  . CVA, old, hemiparesis 07/13/2012  . FTT (failure to thrive) in adult 07/13/2012  . UTI (lower urinary tract infection) 07/13/2012  . Dehydration 07/13/2012  . Lethargy 07/12/2012  . Pressure ulcer, stage 2 07/12/2012  . Cerebral contusion 05/04/2012    CBC    Component Value Date/Time   WBC 4.5 10/01/2013   WBC 4.5 10/01/2013   WBC 9.1 07/14/2012 0700   RBC 3.72* 07/14/2012 0700   RBC 4.65 07/08/2012 1757   HGB 12.1 10/01/2013   HGB 12.1 10/01/2013   HCT 37 10/01/2013   HCT 37 10/01/2013   PLT 179 10/01/2013   PLT 179 10/01/2013   MCV 86.6 07/14/2012 0700   LYMPHSABS 1.7 07/12/2012 2137   LYMPHSABS 2.0 07/08/2012 1757   MONOABS 0.7 07/12/2012 2137   EOSABS 0.0 07/12/2012 2137   EOSABS 0.0 07/08/2012 1757   BASOSABS 0.0 07/12/2012 2137   BASOSABS 0.0 07/08/2012 1757    CMP     Component Value Date/Time   NA 138 10/01/2013   NA 143 07/14/2012 0700   K 3.9 10/01/2013   CL 107 07/14/2012 0700   CO2 28 07/14/2012 0700   GLUCOSE 153* 07/14/2012 0700   GLUCOSE 108* 07/08/2012 1757   BUN 12 10/01/2013   BUN 13 07/14/2012 0700   CREATININE 0.7 10/01/2013   CREATININE 0.62 07/14/2012 0700   CALCIUM 8.5 07/14/2012 0700   PROT 6.6 07/12/2012 2137   PROT 6.4 07/08/2012 1757   ALBUMIN 2.9* 07/12/2012 2137   AST 15 10/01/2013    ALT 9 10/01/2013   ALKPHOS 90 10/01/2013   BILITOT 0.4 07/12/2012 2137   GFRNONAA 82* 07/14/2012 0700   GFRAA >90 07/14/2012 0700    Assessment and Plan  Vascular dementia without behavioral disturbance Advancing dementia;Continue namenda;pt appears content  Essential hypertension, benign Pt's BP is elevated today bur prior BPs have been < 140/90 so will continue current regimen  CVA, old, hemiparesis Chronic and stable with R hemiparesis  Hyperlipidemia Not time for new labs;continue lipitor 20 mg  Anemia 11.7/35.9 in 01/2014 which a  near baseline    Margit Hanks, MD

## 2014-02-12 ENCOUNTER — Encounter: Payer: Self-pay | Admitting: Internal Medicine

## 2014-02-12 DIAGNOSIS — D649 Anemia, unspecified: Secondary | ICD-10-CM | POA: Insufficient documentation

## 2014-02-12 NOTE — Assessment & Plan Note (Signed)
Not time for new labs;continue lipitor 20 mg

## 2014-02-12 NOTE — Assessment & Plan Note (Signed)
11.7/35.9 in 01/2014 which a near baseline

## 2014-02-12 NOTE — Assessment & Plan Note (Signed)
Pt's BP is elevated today bur prior BPs have been < 140/90 so will continue current regimen

## 2014-02-12 NOTE — Assessment & Plan Note (Signed)
Advancing dementia;Continue namenda;pt appears content

## 2014-02-12 NOTE — Assessment & Plan Note (Signed)
Chronic and stable with R hemiparesis

## 2014-03-04 ENCOUNTER — Non-Acute Institutional Stay (SKILLED_NURSING_FACILITY): Payer: Medicare Other | Admitting: Nurse Practitioner

## 2014-03-04 DIAGNOSIS — I10 Essential (primary) hypertension: Secondary | ICD-10-CM

## 2014-03-04 DIAGNOSIS — F0150 Vascular dementia without behavioral disturbance: Secondary | ICD-10-CM

## 2014-03-04 DIAGNOSIS — R627 Adult failure to thrive: Secondary | ICD-10-CM

## 2014-03-04 DIAGNOSIS — I69359 Hemiplegia and hemiparesis following cerebral infarction affecting unspecified side: Secondary | ICD-10-CM

## 2014-03-04 NOTE — Progress Notes (Signed)
Patient ID: Tracie [name], female   DOB: 07-08-1928, 80 y.o.   MRN: [id number]    Nursing Home Location:  [place] of Service: SNF (31)  PCP: Estill Dooms, MD  No Known Allergies  Chief Complaint  Patient presents with  . Medical Management of Chronic Issues    HPI:  Patient is a 80 y.o. female seen today at [facility] for routine follow up on chronic conditions. Pt with  pmh of advanced dementia, CVA with RT sided hemiparesis, htn. Pt with weight loss in the last month RD following and has added double portions which she is eating. Currently getting supplements. Otherwise pt remains stable and staff has no concers.   Review of Systems:  Review of Systems  Unable to perform ROS: Dementia    Past Medical History  Diagnosis Date  . Stroke   . Dementia   . Hypertension   . Laceration of skin of eyelid and periocular area   . Other fall   . Wheelchair dependence   . Hypopotassemia   . Encounters for unspecified administrative purpose   . Other and unspecified hyperlipidemia   . Reflux esophagitis   . Blood in stool   . Routine general medical examination at a health care facility   . Hypopotassemia   . Dementia in conditions classified elsewhere with behavioral disturbance   . Dementia in conditions classified elsewhere without behavioral disturbance   . Loss of weight   . Cough   . Unspecified vitamin D deficiency   . Thrombocytopenia, unspecified   . Unspecified disorder of kidney and ureter   . Other and unspecified hyperlipidemia   . Senile dementia, uncomplicated   . Anxiety state, unspecified   . Unspecified essential hypertension   . Unspecified late effects of cerebrovascular disease   . Other malaise and fatigue   . Abnormality of gait    Past Surgical History  Procedure Laterality Date  . Joint replacement  2008    Dr Eddie Dibbles  . Abdominal hysterectomy  1981   Social History:   reports that she has never  smoked. She has never used smokeless tobacco. She reports that she does not drink alcohol or use illicit drugs.  No family history on file.  Medications: Patient's Medications  New Prescriptions   No medications on file  Previous Medications   ACETAMINOPHEN (TYLENOL) 325 MG TABLET    Take 650 mg by mouth every 6 (six) hours as needed for fever (greater than 101.0).   ACETAMINOPHEN (TYLENOL) 650 MG CR TABLET    Take 650 mg by mouth every 8 (eight) hours as needed for pain.   ALPRAZOLAM (XANAX) 0.25 MG TABLET    Take one tablet by mouth every 8 hours as needed for anixety   ASPIRIN 81 MG CHEWABLE TABLET    Chew 81 mg by mouth daily. For CVA   ATORVASTATIN (LIPITOR) 20 MG TABLET    Take 20 mg by mouth daily. For cholesterol   CHOLECALCIFEROL 2000 UNITS TABS    Take 2,000 tablets by mouth daily. For calcium supplement   LISINOPRIL (PRINIVIL,ZESTRIL) 10 MG TABLET    Take 10 mg by mouth daily. For HTN   MAGNESIUM HYDROXIDE (MILK OF MAGNESIA) 800 MG/5ML SUSPENSION    Take 30 mLs by mouth daily as needed for constipation.   MEMANTINE HCL ER 28 MG CP24    Take 28 mg by mouth daily. to help preserve memory.   SODIUM  PHOSPHATES (RA SALINE ENEMA RE)    Place rectally as needed.  Modified Medications   No medications on file  Discontinued Medications   No medications on file     Physical Exam: Filed Vitals:   03/04/14 1339  BP: 142/77  Pulse: 89  Temp: 98.9 F (37.2 C)  Resp: 20  Weight: 99 lb (44.906 kg)    Physical Exam  Constitutional: No distress.  Thin. Frail.  HENT:  Mouth/Throat: Oropharynx is clear and moist.  Neck: Neck supple.  Cardiovascular: Normal rate, regular rhythm and normal heart sounds.   Pulmonary/Chest: Effort normal and breath sounds normal.  Abdominal: Soft. Bowel sounds are normal.  Musculoskeletal: She exhibits no edema or tenderness.  Right hemiparesis  Neurological: She is alert.  Right hemiparesis. Significant dementia.   Skin: Skin is warm and dry.    Psychiatric: Her behavior is normal.    Labs reviewed: Basic Metabolic Panel:  Recent Labs  08/30/13 10/01/13  NA 138 138  K 3.9 3.9  BUN 12 12  CREATININE 0.7 0.7   Liver Function Tests:  Recent Labs  10/01/13  AST 15  ALT 9  ALKPHOS 90   No results for input(s): LIPASE, AMYLASE in the last 8760 hours. No results for input(s): AMMONIA in the last 8760 hours. CBC:  Recent Labs  10/01/13  WBC 4.5  4.5  HGB 12.1  12.1  HCT 37  37  PLT 179  179   TSH: No results for input(s): TSH in the last 8760 hours. A1C: Lab Results  Component Value Date   HGBA1C 6.5* 07/13/2012   Lipid Panel:  Recent Labs  10/01/13  CHOL 174  174  HDL 54  54  LDLCALC 113  113  TRIG 35*     WBC 5.3     4.0-10.5 K/uL SLN   RBC 4.11     3.87-5.11 MIL/uL SLN   Hemoglobin 11.7   L 12.0-15.0 g/dL SLN   Hematocrit 35.9   L 36.0-46.0 % SLN   MCV 87.3     78.0-100.0 fL SLN   MCH 28.5     26.0-34.0 pg SLN   MCHC 32.6     30.0-36.0 g/dL SLN   RDW 13.4     11.5-15.5 % SLN   Platelet Count 174     150-400 K/uL SLN   Granulocyte % 51     43-77 % SLN   Absolute Gran 2.7     1.7-7.7 K/uL SLN   Lymph % 34     12-46 % SLN   Absolute Lymph 1.8     0.7-4.0 K/uL SLN   Mono % 12     3-12 % SLN   Absolute Mono 0.6     0.1-1.0 K/uL SLN   Eos % 3     0-5 % SLN   Absolute Eos 0.2     0.0-0.7 K/uL SLN   Baso % 0     0-1 % SLN   Absolute Baso 0.0     0.0-0.1 K/uL SLN   Smear Review Criteria for review not met  SLN   Lipid Panel    Result: 01/30/2014 4:54 PM   ( Status: F )       Cholesterol 173     0-200 mg/dL SLN C Triglyceride 48     <150 mg/dL SLN   HDL Cholesterol 46     >39 mg/dL SLN   Total Chol/HDL Ratio 3.8      Ratio SLN  VLDL Cholesterol (Calc) 10     0-40 mg/dL SLN   LDL Cholesterol (Calc) 117   H 0-99 mg/dL SLN C Hepatic Function Panel    Result: 01/30/2014 4:54 PM   ( Status: F )       Bilirubin, Total 0.4     0.2-1.2 mg/dL SLN   Bilirubin, Direct 0.1     0.0-0.3 mg/dL SLN    Indirect Bilirubin 0.3     0.2-1.2 mg/dL SLN   Alkaline Phosphatase 83     39-117 U/L SLN   AST/SGOT 12     0-37 U/L SLN   ALT/SGPT <8     0-35 U/L SLN   Total Protein 6.1     6.0-8.3 g/dL SLN   Albumin 3.4   L 3.5-5.2 g/dL SLN  Basic Metabolic Panel    Result: 02/08/2014 4:56 PM   ( Status: F )       Sodium 140     135-145 mEq/L SLN   Potassium 3.8     3.5-5.3 mEq/L SLN   Chloride 106     96-112 mEq/L SLN   CO2 26     19-32 mEq/L SLN   Glucose 95     70-99 mg/dL SLN   BUN 12     6-23 mg/dL SLN   Creatinine 0.73     0.50-1.10 mg/dL SLN   Calcium 8.6     8.4-10.5 mg/dL SLN Assessment/Plan 1. FTT (failure to thrive) in adult Albumin slightly low, will cont to monitor, currently being fed by staff with double portions and getting supplements   2. Vascular dementia without behavioral disturbance advanced  3. CVA, old, hemiparesis With right hemiparesis and contractures, conts to wear brace -conts on ASA  4. Essential hypertension, benign -bmp reviewed and stable conts on current medication with good bp control

## 2014-03-29 ENCOUNTER — Non-Acute Institutional Stay (SKILLED_NURSING_FACILITY): Payer: Medicare Other | Admitting: Nurse Practitioner

## 2014-03-29 DIAGNOSIS — I10 Essential (primary) hypertension: Secondary | ICD-10-CM

## 2014-03-29 DIAGNOSIS — E785 Hyperlipidemia, unspecified: Secondary | ICD-10-CM

## 2014-03-29 DIAGNOSIS — D638 Anemia in other chronic diseases classified elsewhere: Secondary | ICD-10-CM

## 2014-03-29 DIAGNOSIS — I69359 Hemiplegia and hemiparesis following cerebral infarction affecting unspecified side: Secondary | ICD-10-CM

## 2014-03-29 DIAGNOSIS — F0150 Vascular dementia without behavioral disturbance: Secondary | ICD-10-CM

## 2014-03-29 NOTE — Progress Notes (Signed)
Patient ID: Tracie [name], female   DOB: 01-Apr-1928, 81 y.o.   MRN: [id number]    Nursing Home Location:  [place] of Service: SNF (31)  PCP: Estill Dooms, MD  No Known Allergies  Chief Complaint  Patient presents with  . Medical Management of Chronic Issues    HPI:  Patient is a 81 y.o. female seen today at [facility] for routine follow up on chronic conditions. Pt with  pmh of advanced dementia, CVA with RT sided hemiparesis, htn. Pt with weight loss, last month RD following and has added double portions which she is eating however she sometimes is slow to swallowing. Pt holding spit in her mouth without swallowing. Staff reports this happens occasionally. ST has evaluated pt. No new intervention due to progression of dementia. Staff is feeding and monitoring pt during meals Review of Systems:  Review of Systems  Unable to perform ROS: Dementia    Past Medical History  Diagnosis Date  . Stroke   . Dementia   . Hypertension   . Laceration of skin of eyelid and periocular area   . Other fall   . Wheelchair dependence   . Hypopotassemia   . Encounters for unspecified administrative purpose   . Other and unspecified hyperlipidemia   . Reflux esophagitis   . Blood in stool   . Routine general medical examination at a health care facility   . Hypopotassemia   . Dementia in conditions classified elsewhere with behavioral disturbance   . Dementia in conditions classified elsewhere without behavioral disturbance   . Loss of weight   . Cough   . Unspecified vitamin D deficiency   . Thrombocytopenia, unspecified   . Unspecified disorder of kidney and ureter   . Other and unspecified hyperlipidemia   . Senile dementia, uncomplicated   . Anxiety state, unspecified   . Unspecified essential hypertension   . Unspecified late effects of cerebrovascular disease   . Other malaise and fatigue   . Abnormality of gait    Past Surgical  History  Procedure Laterality Date  . Joint replacement  2008    Dr Eddie Dibbles  . Abdominal hysterectomy  1981   Social History:   reports that she has never smoked. She has never used smokeless tobacco. She reports that she does not drink alcohol or use illicit drugs.  No family history on file.  Medications: Patient's Medications  New Prescriptions   No medications on file  Previous Medications   ACETAMINOPHEN (TYLENOL) 325 MG TABLET    Take 650 mg by mouth every 6 (six) hours as needed for fever (greater than 101.0).   ACETAMINOPHEN (TYLENOL) 650 MG CR TABLET    Take 650 mg by mouth every 8 (eight) hours as needed for pain.   ALPRAZOLAM (XANAX) 0.25 MG TABLET    Take one tablet by mouth every 8 hours as needed for anixety   ASPIRIN 81 MG CHEWABLE TABLET    Chew 81 mg by mouth daily. For CVA   ATORVASTATIN (LIPITOR) 20 MG TABLET    Take 20 mg by mouth daily. For cholesterol   CHOLECALCIFEROL 2000 UNITS TABS    Take 2,000 tablets by mouth daily. For calcium supplement   LISINOPRIL (PRINIVIL,ZESTRIL) 10 MG TABLET    Take 10 mg by mouth daily. For HTN   MAGNESIUM HYDROXIDE (MILK OF MAGNESIA) 800 MG/5ML SUSPENSION    Take 30 mLs by mouth daily as needed for constipation.  MEMANTINE HCL ER 28 MG CP24    Take 28 mg by mouth daily. to help preserve memory.   SODIUM PHOSPHATES (RA SALINE ENEMA RE)    Place rectally as needed.  Modified Medications   No medications on file  Discontinued Medications   No medications on file     Physical Exam: Filed Vitals:   03/29/14 1426  BP: 112/54  Pulse: 80  Temp: 98 F (36.7 C)  Resp: 20  Weight: 99 lb (44.906 kg)    Physical Exam  Constitutional: No distress.  Thin. Frail.  HENT:  Mouth/Throat: Oropharynx is clear and moist.  Neck: Neck supple.  Cardiovascular: Normal rate, regular rhythm and normal heart sounds.   Pulmonary/Chest: Effort normal and breath sounds normal.  Abdominal: Soft. Bowel sounds are normal.  Musculoskeletal: She  exhibits no edema or tenderness.  Right hemiparesis  Neurological: She is alert.  Right hemiparesis. Significant dementia.   Skin: Skin is warm and dry.  Psychiatric: Her behavior is normal.    Labs reviewed: Basic Metabolic Panel:  Recent Labs  08/30/13 10/01/13  NA 138 138  K 3.9 3.9  BUN 12 12  CREATININE 0.7 0.7   Liver Function Tests:  Recent Labs  10/01/13  AST 15  ALT 9  ALKPHOS 90   No results for input(s): LIPASE, AMYLASE in the last 8760 hours. No results for input(s): AMMONIA in the last 8760 hours. CBC:  Recent Labs  10/01/13  WBC 4.5  4.5  HGB 12.1  12.1  HCT 37  37  PLT 179  179   TSH: No results for input(s): TSH in the last 8760 hours. A1C: Lab Results  Component Value Date   HGBA1C 6.5* 07/13/2012   Lipid Panel:  Recent Labs  10/01/13  CHOL 174  174  HDL 54  54  LDLCALC 113  113  TRIG 35*     WBC 5.3     4.0-10.5 K/uL SLN   RBC 4.11     3.87-5.11 MIL/uL SLN   Hemoglobin 11.7   L 12.0-15.0 g/dL SLN   Hematocrit 35.9   L 36.0-46.0 % SLN   MCV 87.3     78.0-100.0 fL SLN   MCH 28.5     26.0-34.0 pg SLN   MCHC 32.6     30.0-36.0 g/dL SLN   RDW 13.4     11.5-15.5 % SLN   Platelet Count 174     150-400 K/uL SLN   Granulocyte % 51     43-77 % SLN   Absolute Gran 2.7     1.7-7.7 K/uL SLN   Lymph % 34     12-46 % SLN   Absolute Lymph 1.8     0.7-4.0 K/uL SLN   Mono % 12     3-12 % SLN   Absolute Mono 0.6     0.1-1.0 K/uL SLN   Eos % 3     0-5 % SLN   Absolute Eos 0.2     0.0-0.7 K/uL SLN   Baso % 0     0-1 % SLN   Absolute Baso 0.0     0.0-0.1 K/uL SLN   Smear Review Criteria for review not met  SLN   Lipid Panel    Result: 01/30/2014 4:54 PM   ( Status: F )       Cholesterol 173     0-200 mg/dL SLN C Triglyceride 48     <150 mg/dL SLN   HDL Cholesterol 46     >  39 mg/dL SLN   Total Chol/HDL Ratio 3.8      Ratio SLN   VLDL Cholesterol (Calc) 10     0-40 mg/dL SLN   LDL Cholesterol (Calc) 117   H 0-99 mg/dL SLN C Hepatic  Function Panel    Result: 01/30/2014 4:54 PM   ( Status: F )       Bilirubin, Total 0.4     0.2-1.2 mg/dL SLN   Bilirubin, Direct 0.1     0.0-0.3 mg/dL SLN   Indirect Bilirubin 0.3     0.2-1.2 mg/dL SLN   Alkaline Phosphatase 83     39-117 U/L SLN   AST/SGOT 12     0-37 U/L SLN   ALT/SGPT <8     0-35 U/L SLN   Total Protein 6.1     6.0-8.3 g/dL SLN   Albumin 3.4   L 3.5-5.2 g/dL SLN  Basic Metabolic Panel    Result: 02/08/2014 4:56 PM   ( Status: F )       Sodium 140     135-145 mEq/L SLN   Potassium 3.8     3.5-5.3 mEq/L SLN   Chloride 106     96-112 mEq/L SLN   CO2 26     19-32 mEq/L SLN   Glucose 95     70-99 mg/dL SLN   BUN 12     6-23 mg/dL SLN   Creatinine 0.73     0.50-1.10 mg/dL SLN   Calcium 8.6     8.4-10.5 mg/dL SLN Assessment/Plan 1. Essential hypertension, benign Blood pressure in adequate range on current regimen   2. CVA, old, hemiparesis conts on ASA, no changes  3. Vascular dementia without behavioral disturbance Advanced, with swallowing difficulty. ST has optimized recs, staff monitoring pt conts on namenda  4. Hyperlipidemia conts on lipitor   5. Anemia in other chronic diseases classified elsewhere -recent hgb stable

## 2014-04-23 IMAGING — CT CT ABDOMEN W/ CM
2 of 5 series · 17 of 46 positions shown, 19 images · IV contrast (APPLIED)
Comparison: 01/27/2004

CLINICAL DATA: Right upper quadrant pain post fall, history
hypertension, stroke, diabetes

CT ABDOMEN WITH CONTRAST
TECHNIQUE: Multidetector CT imaging of the abdomen was performed
using the standard protocol following bolus administration of
intravenous contrast.  Sagittal and coronal MPR images
reconstructed from axial data set.
Contrast: 80mL OMNIPAQUE IOHEXOL 300 MG/ML  SOLN No oral contrast
administered.

[Series 2: abd with 5.0 b31f st · axial · 0.69mm/px · z∈[-304,-80]mm · 14 of 53 slices shown, 16 images]
[im 4/53  soft-tissue]
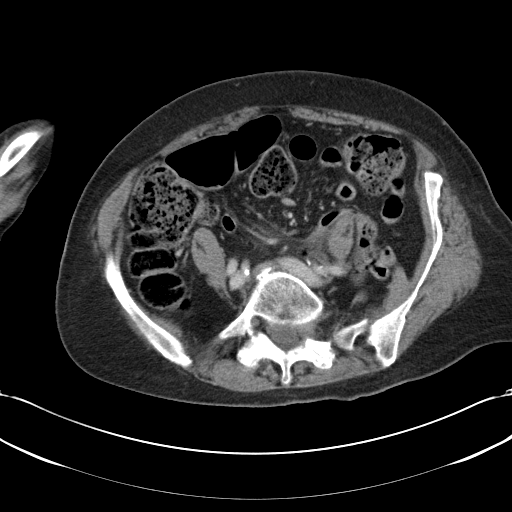
[im 4/53  bone]
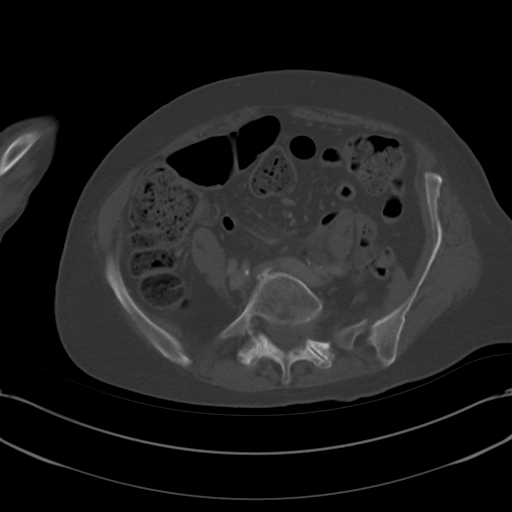
[im 7/53  soft-tissue]
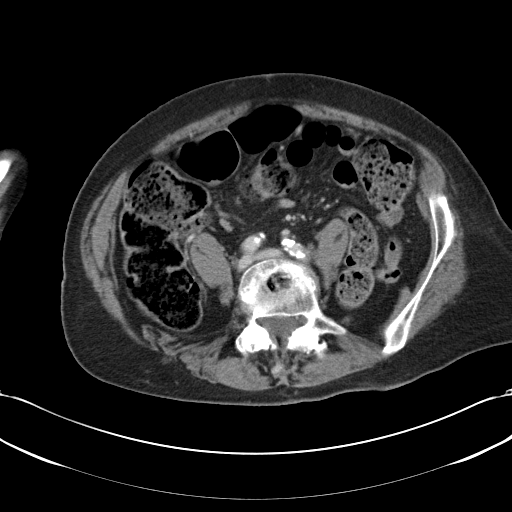
[im 10/53  soft-tissue]
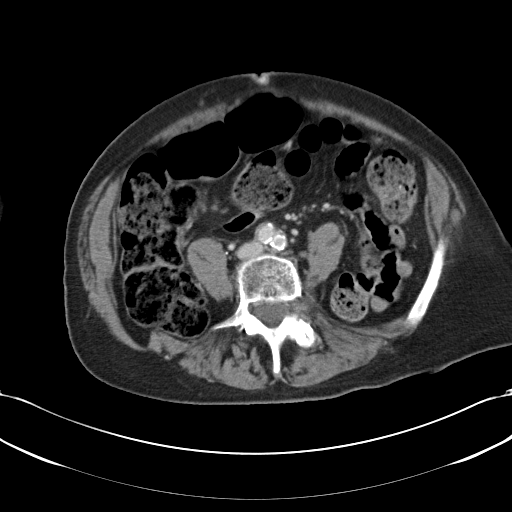
[im 14/53  soft-tissue]
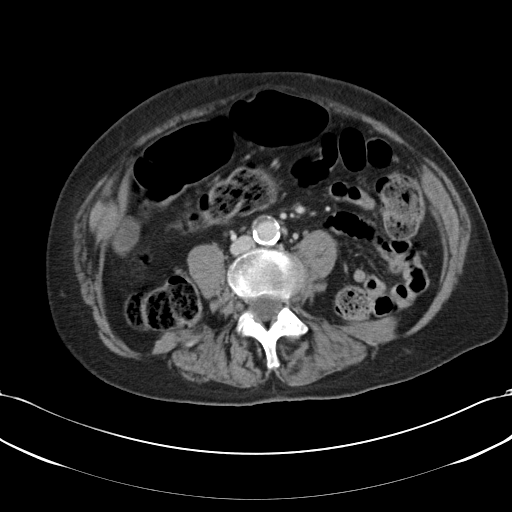
[im 17/53  soft-tissue]
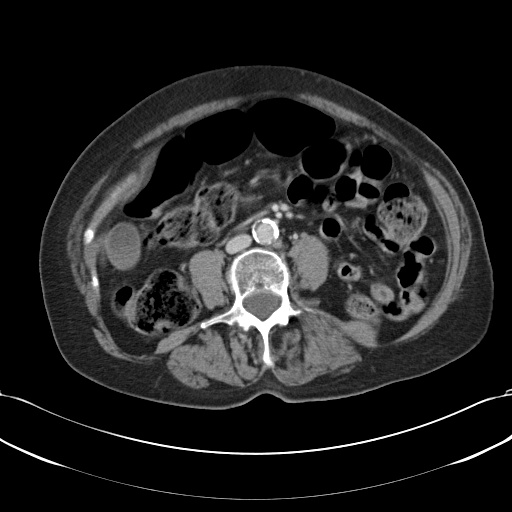
[im 20/53  soft-tissue]
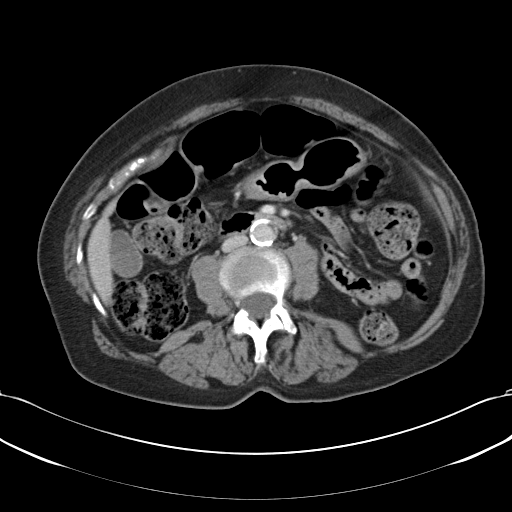
[im 23/53  soft-tissue]
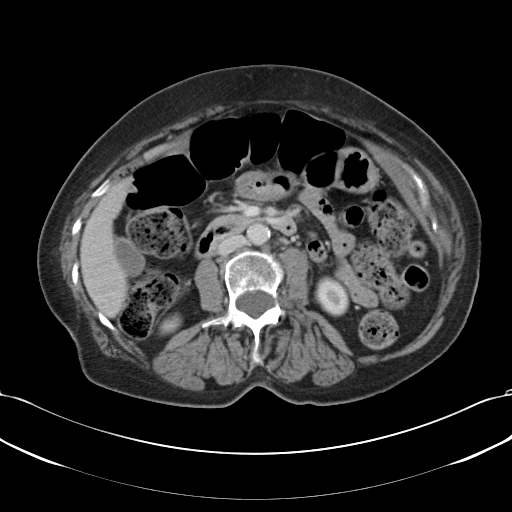
[im 30/53  soft-tissue]
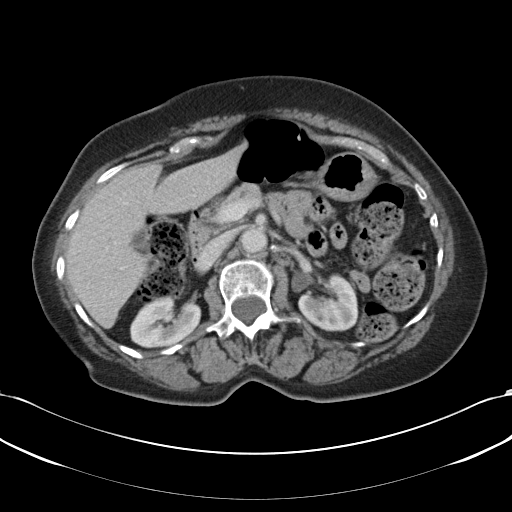
[im 33/53  soft-tissue]
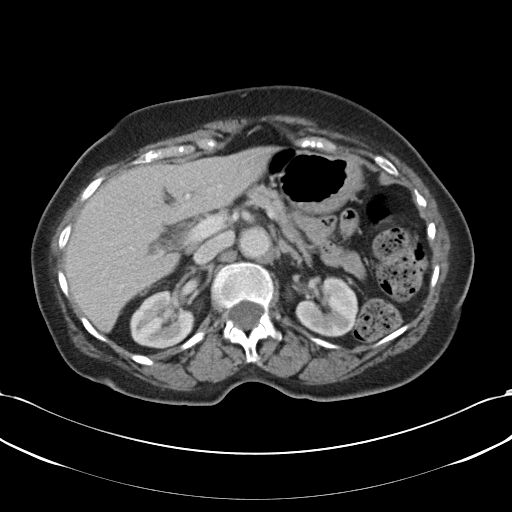
[im 33/53  bone]
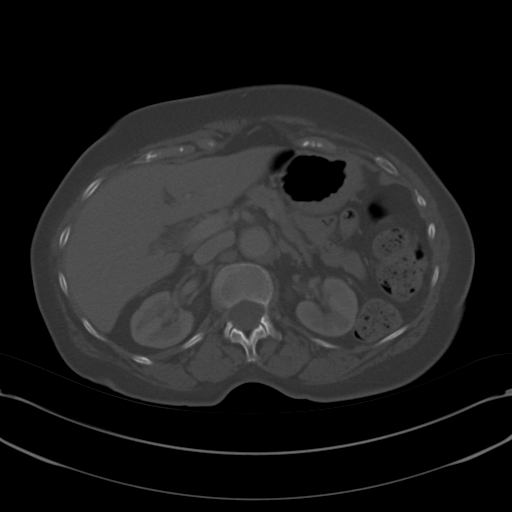
[im 36/53  soft-tissue]
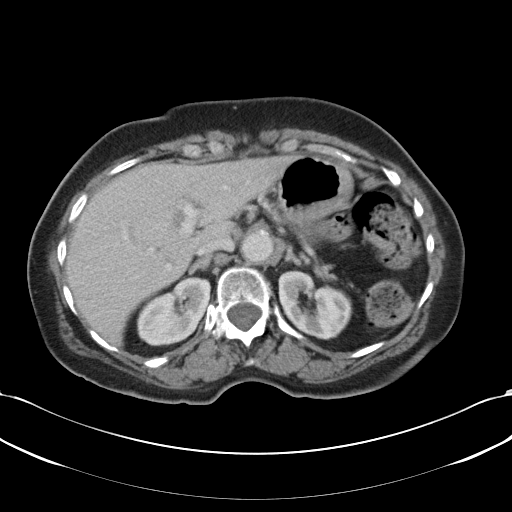
[im 40/53  soft-tissue]
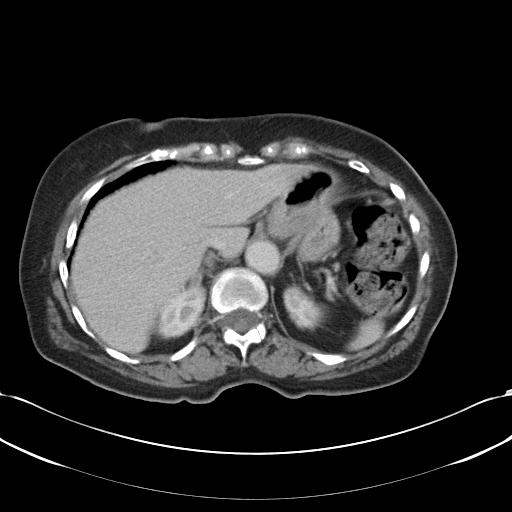
[im 43/53  soft-tissue]
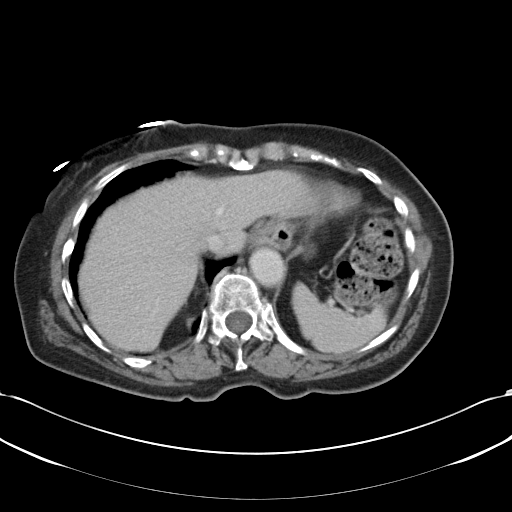
[im 46/53  soft-tissue]
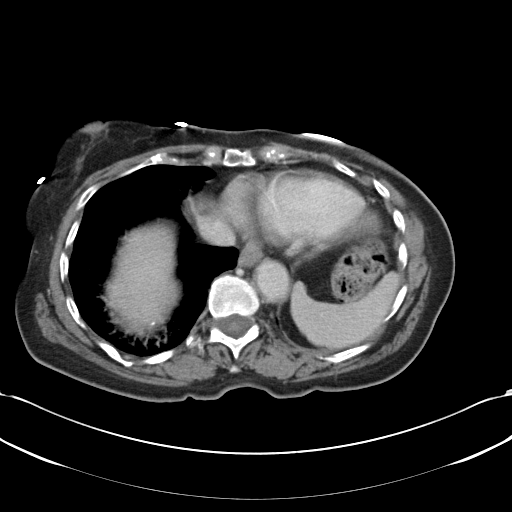
[im 49/53  soft-tissue]
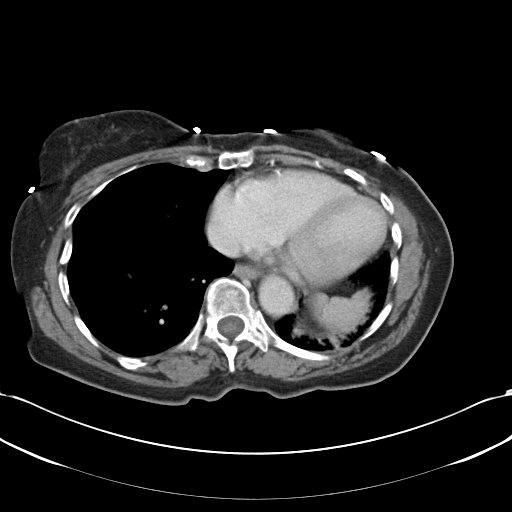

[Series 5: coronals cor · coronal · 0.58mm/px · 3 of 103 slices shown]
[im 35/103  soft-tissue]
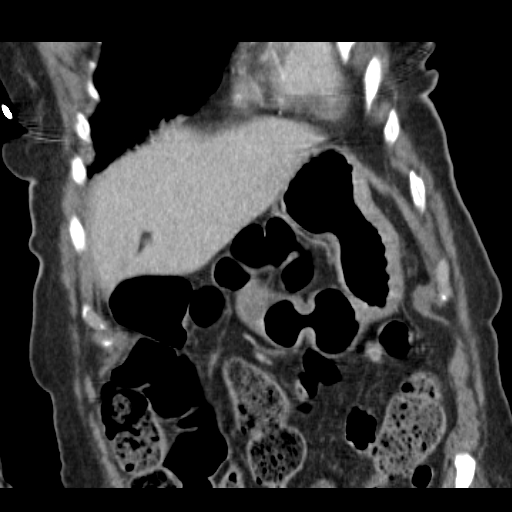
[im 46/103  soft-tissue]
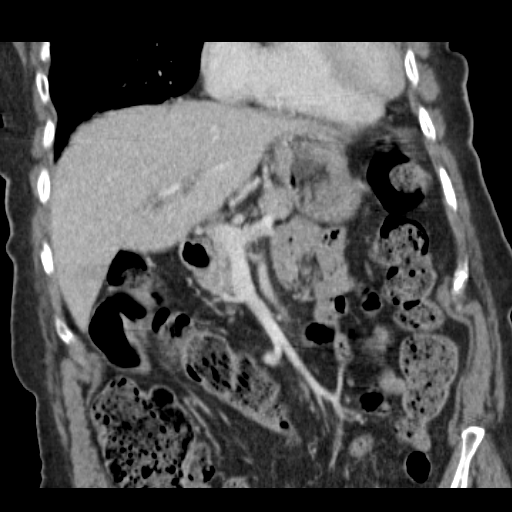
[im 57/103  soft-tissue]
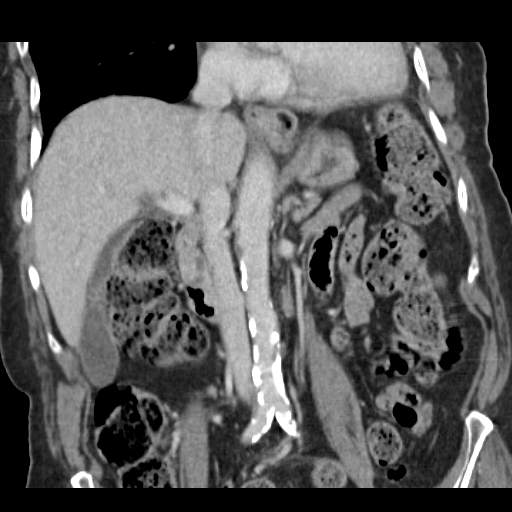

[17 of 46 positions shown; findings below may reference images not displayed]

FINDINGS: Atelectasis left lower lobe, minimally at right base.
Liver, spleen, pancreas, kidneys, and adrenal glands unremarkable
for age.
Small splenule inferior to spleen.
Atherosclerotic changes abdominal aorta and iliac arteries without
aneurysm.
Mild narrowing of the origin of the superior mesenteric artery is
incidentally noted.
Stomach and bowel loops grossly unremarkable for exam lacking GI
contrast.
No mass, adenopathy, free fluid or free air.
Bones appear diffusely demineralized without definite fracture.
Degenerative disc disease changes lower lumbar spine.
IMPRESSION: No definite acute intra abdominal abnormalities.

## 2014-05-09 ENCOUNTER — Encounter: Payer: Self-pay | Admitting: Nurse Practitioner

## 2014-05-09 ENCOUNTER — Non-Acute Institutional Stay (SKILLED_NURSING_FACILITY): Payer: Medicare Other | Admitting: Nurse Practitioner

## 2014-05-09 DIAGNOSIS — D638 Anemia in other chronic diseases classified elsewhere: Secondary | ICD-10-CM

## 2014-05-09 DIAGNOSIS — F0150 Vascular dementia without behavioral disturbance: Secondary | ICD-10-CM

## 2014-05-09 DIAGNOSIS — I10 Essential (primary) hypertension: Secondary | ICD-10-CM

## 2014-05-09 DIAGNOSIS — E785 Hyperlipidemia, unspecified: Secondary | ICD-10-CM

## 2014-05-09 DIAGNOSIS — I69359 Hemiplegia and hemiparesis following cerebral infarction affecting unspecified side: Secondary | ICD-10-CM

## 2014-05-09 NOTE — Progress Notes (Signed)
Patient ID: Tracie [name], female   DOB: 04-18-1928, 81 y.o.   MRN: [id number]    Nursing Home Location:  [place] of Service: SNF (31)  PCP: Estill Dooms, MD  No Known Allergies  Chief Complaint  Patient presents with  . Medical Management of Chronic Issues    HPI:  Patient is a 81 y.o. female seen today at [facility] for routine follow up on chronic conditions. Pt with  pmh of advanced dementia, CVA with RT sided hemiparesis, htn. Pt hx of weight loss, RD following, double portions were added and weight has remained stable over the last 2 months.  Pt cont to holdspit in her mouth without swallowing due to advanced nature of dementia there is no intervention at this time. No acute changes in the last month.  Review of Systems:  Review of Systems  Unable to perform ROS: Dementia    Past Medical History  Diagnosis Date  . Stroke   . Dementia   . Hypertension   . Laceration of skin of eyelid and periocular area   . Other fall   . Wheelchair dependence   . Hypopotassemia   . Encounters for unspecified administrative purpose   . Other and unspecified hyperlipidemia   . Reflux esophagitis   . Blood in stool   . Routine general medical examination at a health care facility   . Hypopotassemia   . Dementia in conditions classified elsewhere with behavioral disturbance   . Dementia in conditions classified elsewhere without behavioral disturbance   . Loss of weight   . Cough   . Unspecified vitamin D deficiency   . Thrombocytopenia, unspecified   . Unspecified disorder of kidney and ureter   . Other and unspecified hyperlipidemia   . Senile dementia, uncomplicated   . Anxiety state, unspecified   . Unspecified essential hypertension   . Unspecified late effects of cerebrovascular disease   . Other malaise and fatigue   . Abnormality of gait   . Cerebral contusion 05/04/2012  . Tremors of nervous system 11/11/2012   Past Surgical  History  Procedure Laterality Date  . Joint replacement  2008    Dr Eddie Dibbles  . Abdominal hysterectomy  1981   Social History:   reports that she has never smoked. She has never used smokeless tobacco. She reports that she does not drink alcohol or use illicit drugs.  No family history on file.  Medications: Patient's Medications  New Prescriptions   No medications on file  Previous Medications   ACETAMINOPHEN (TYLENOL) 325 MG TABLET    Take 650 mg by mouth every 6 (six) hours as needed for fever (greater than 101.0).   ACETAMINOPHEN (TYLENOL) 650 MG CR TABLET    Take 650 mg by mouth every 8 (eight) hours as needed for pain.   ALPRAZOLAM (XANAX) 0.25 MG TABLET    Take one tablet by mouth every 8 hours as needed for anixety   ASPIRIN 81 MG CHEWABLE TABLET    Chew 81 mg by mouth daily. For CVA   ATORVASTATIN (LIPITOR) 20 MG TABLET    Take 20 mg by mouth daily. For cholesterol   CHOLECALCIFEROL 2000 UNITS TABS    Take 2,000 tablets by mouth daily. For calcium supplement   LISINOPRIL (PRINIVIL,ZESTRIL) 10 MG TABLET    Take 10 mg by mouth daily. For HTN   MAGNESIUM HYDROXIDE (MILK OF MAGNESIA) 800 MG/5ML SUSPENSION    Take 30 mLs  by mouth daily as needed for constipation.   MEMANTINE HCL ER 28 MG CP24    Take 28 mg by mouth daily. to help preserve memory.   SODIUM PHOSPHATES (RA SALINE ENEMA RE)    Place rectally as needed.  Modified Medications   No medications on file  Discontinued Medications   No medications on file     Physical Exam: Filed Vitals:   05/09/14 1833  BP: 103/69  Pulse: 70  Temp: 97.1 F (36.2 C)  Resp: 20  Weight: 99 lb (44.906 kg)    Physical Exam  Constitutional: No distress.  Thin. Frail.  HENT:  Mouth/Throat: Oropharynx is clear and moist.  Neck: Neck supple.  Cardiovascular: Normal rate, regular rhythm and normal heart sounds.   Pulmonary/Chest: Effort normal and breath sounds normal.  Abdominal: Soft. Bowel sounds are normal.  Musculoskeletal: She  exhibits no edema or tenderness.  Right hemiparesis  Neurological: She is alert.  Right hemiparesis. Significant dementia.   Skin: Skin is warm and dry.  Psychiatric: Her behavior is normal.    Labs reviewed: Basic Metabolic Panel:  Recent Labs  08/30/13 10/01/13  NA 138 138  K 3.9 3.9  BUN 12 12  CREATININE 0.7 0.7   Liver Function Tests:  Recent Labs  10/01/13  AST 15  ALT 9  ALKPHOS 90   No results for input(s): LIPASE, AMYLASE in the last 8760 hours. No results for input(s): AMMONIA in the last 8760 hours. CBC:  Recent Labs  10/01/13  WBC 4.5  4.5  HGB 12.1  12.1  HCT 37  37  PLT 179  179   TSH: No results for input(s): TSH in the last 8760 hours. A1C: Lab Results  Component Value Date   HGBA1C 6.5* 07/13/2012   Lipid Panel:  Recent Labs  10/01/13  CHOL 174  174  HDL 54  54  LDLCALC 113  113  TRIG 35*     WBC 5.3     4.0-10.5 K/uL SLN   RBC 4.11     3.87-5.11 MIL/uL SLN   Hemoglobin 11.7   L 12.0-15.0 g/dL SLN   Hematocrit 35.9   L 36.0-46.0 % SLN   MCV 87.3     78.0-100.0 fL SLN   MCH 28.5     26.0-34.0 pg SLN   MCHC 32.6     30.0-36.0 g/dL SLN   RDW 13.4     11.5-15.5 % SLN   Platelet Count 174     150-400 K/uL SLN   Granulocyte % 51     43-77 % SLN   Absolute Gran 2.7     1.7-7.7 K/uL SLN   Lymph % 34     12-46 % SLN   Absolute Lymph 1.8     0.7-4.0 K/uL SLN   Mono % 12     3-12 % SLN   Absolute Mono 0.6     0.1-1.0 K/uL SLN   Eos % 3     0-5 % SLN   Absolute Eos 0.2     0.0-0.7 K/uL SLN   Baso % 0     0-1 % SLN   Absolute Baso 0.0     0.0-0.1 K/uL SLN   Smear Review Criteria for review not met  SLN   Lipid Panel    Result: 01/30/2014 4:54 PM   ( Status: F )       Cholesterol 173     0-200 mg/dL SLN C Triglyceride 48     <  150 mg/dL SLN   HDL Cholesterol 46     >39 mg/dL SLN   Total Chol/HDL Ratio 3.8      Ratio SLN   VLDL Cholesterol (Calc) 10     0-40 mg/dL SLN   LDL Cholesterol (Calc) 117   H 0-99 mg/dL SLN C Hepatic  Function Panel    Result: 01/30/2014 4:54 PM   ( Status: F )       Bilirubin, Total 0.4     0.2-1.2 mg/dL SLN   Bilirubin, Direct 0.1     0.0-0.3 mg/dL SLN   Indirect Bilirubin 0.3     0.2-1.2 mg/dL SLN   Alkaline Phosphatase 83     39-117 U/L SLN   AST/SGOT 12     0-37 U/L SLN   ALT/SGPT <8     0-35 U/L SLN   Total Protein 6.1     6.0-8.3 g/dL SLN   Albumin 3.4   L 3.5-5.2 g/dL SLN  Basic Metabolic Panel    Result: 02/08/2014 4:56 PM   ( Status: F )       Sodium 140     135-145 mEq/L SLN   Potassium 3.8     3.5-5.3 mEq/L SLN   Chloride 106     96-112 mEq/L SLN   CO2 26     19-32 mEq/L SLN   Glucose 95     70-99 mg/dL SLN   BUN 12     6-23 mg/dL SLN   Creatinine 0.73     0.50-1.10 mg/dL SLN   Calcium 8.6     8.4-10.5 mg/dL SLN Assessment/Plan 1. Essential hypertension, benign Blood pressure in adequate range on current regimen, will follow up labs next month  2. CVA, old, hemiparesis no changes, conts on ASA, will need follow up cbc next month   3. Vascular dementia without behavioral disturbance Advanced, with swallowing difficulty. Again, ST has optimized recs, staff monitoring pt during meal times.  conts on namenda however at this point dementia is very advanced.  4. Hyperlipidemia Due to advanced nature of dementia will dc lipitor and follow up lab work   5. Anemia in other chronic diseases classified elsewhere -hgb stable on last labs, will follow up cbc next month

## 2014-05-22 ENCOUNTER — Non-Acute Institutional Stay (SKILLED_NURSING_FACILITY): Payer: Medicare Other | Admitting: Internal Medicine

## 2014-05-22 DIAGNOSIS — F0150 Vascular dementia without behavioral disturbance: Secondary | ICD-10-CM

## 2014-05-22 DIAGNOSIS — I69359 Hemiplegia and hemiparesis following cerebral infarction affecting unspecified side: Secondary | ICD-10-CM

## 2014-05-22 DIAGNOSIS — I10 Essential (primary) hypertension: Secondary | ICD-10-CM | POA: Diagnosis not present

## 2014-05-22 DIAGNOSIS — L89151 Pressure ulcer of sacral region, stage 1: Secondary | ICD-10-CM | POA: Diagnosis not present

## 2014-05-22 NOTE — Progress Notes (Signed)
MRN: [id number] Name: Tracie [name]  Sex: female Age: 82 y.o. DOB: 1929-02-07  [id number] Facility/Room:225B Level Of Care: SNF Provider: Merrilee Seashore D Emergency Contacts: Extended Emergency Contact Information Primary Emergency Contact: Verner,Samuel Address: [address]          [place], [place] [address] D[place] of [place] Home Phone: [phone] Mobile Phone: [phone] Relation: Spouse Secondary Emergency Contact: Sandria Senter Address: [address]          [place], [place] [address] [place] of [place] Home Phone: [phone] Relation: Son     Allergies: Review of patient's allergies indicates no known allergies.  Chief Complaint  Patient presents with  . Medical Management of Chronic Issues    HPI: Patient is 81 y.o. female who is being seen for routine issues.  Past Medical History  Diagnosis Date  . Stroke   . Dementia   . Hypertension   . Laceration of skin of eyelid and periocular area   . Other fall   . Wheelchair dependence   . Hypopotassemia   . Encounters for unspecified administrative purpose   . Other and unspecified hyperlipidemia   . Reflux esophagitis   . Blood in stool   . Routine general medical examination at a health care facility   . Hypopotassemia   . Dementia in conditions classified elsewhere with behavioral disturbance   . Dementia in conditions classified elsewhere without behavioral disturbance   . Loss of weight   . Cough   . Unspecified vitamin D deficiency   . Thrombocytopenia, unspecified   . Unspecified disorder of kidney and ureter   . Other and unspecified hyperlipidemia   . Senile dementia, uncomplicated   . Anxiety state, unspecified   . Unspecified essential hypertension   . Unspecified late effects of cerebrovascular disease   . Other malaise and fatigue   . Abnormality of gait   . Cerebral contusion 05/04/2012  . Tremors of nervous system 11/11/2012    Past Surgical History  Procedure  Laterality Date  . Joint replacement  2008    Dr Renae Fickle  . Abdominal hysterectomy  1981      Medication List       This list is accurate as of: 05/22/14 11:59 PM.  Always use your most recent med list.               acetaminophen 325 MG tablet  Commonly known as:  TYLENOL  Take 650 mg by mouth every 6 (six) hours as needed for fever (greater than 101.0).     acetaminophen 650 MG CR tablet  Commonly known as:  TYLENOL  Take 650 mg by mouth every 8 (eight) hours as needed for pain.     ALPRAZolam 0.25 MG tablet  Commonly known as:  XANAX  Take one tablet by mouth every 8 hours as needed for anixety     aspirin 81 MG chewable tablet  Chew 81 mg by mouth daily. For CVA     Cholecalciferol 2000 UNITS Tabs  Take 2,000 tablets by mouth daily. For calcium supplement     lisinopril 10 MG tablet  Commonly known as:  PRINIVIL,ZESTRIL  Take 10 mg by mouth daily. For HTN     magnesium hydroxide 800 MG/5ML suspension  Commonly known as:  MILK OF MAGNESIA  Take 30 mLs by mouth daily as needed for constipation.     memantine 28 MG Cp24 24 hr capsule  Commonly known as:  NAMENDA XR  Take 28 mg by  mouth daily. to help preserve memory.     RA SALINE ENEMA RE  Place rectally as needed.        No orders of the defined types were placed in this encounter.    Immunization History  Administered Date(s) Administered  . Influenza-Unspecified 12/23/2012  . Pneumococcal Polysaccharide-23 07/15/2012    History  Substance Use Topics  . Smoking status: Never Smoker   . Smokeless tobacco: Never Used  . Alcohol Use: No    Review of Systems  DATA OBTAINED: from nurse GENERAL:  no fevers, fatigue, appetite changes SKIN: No itching, rash HEENT: No complaint RESPIRATORY: No cough, wheezing, SOB CARDIAC: No chest pain, palpitations, lower extremity edema  GI: No abdominal pain, No N/V/D or constipation, No heartburn or reflux  GU: No dysuria, frequency or urgency, or incontinence   MUSCULOSKELETAL: No unrelieved bone/joint pain NEUROLOGIC: No headache, dizziness  PSYCHIATRIC: No overt anxiety or sadness  There were no vitals filed for this visit.  Physical Exam  GENERAL APPEARANCE: Alert, nonBFconversant, No acute distress  SKIN: No diaphoresis rash HEENT: Unremarkable RESPIRATORY: Breathing is even, unlabored. Lung sounds are clear   CARDIOVASCULAR: Heart RRR no murmurs, rubs or gallops. No peripheral edema  GASTROINTESTINAL: Abdomen is soft, non-tender, not distended w/ normal bowel sounds.  GENITOURINARY: Bladder non tender, not distended  MUSCULOSKELETAL: No abnormal joints or musculature NEUROLOGIC: Cranial nerves 2-12 grossly intact PSYCHIATRIC: contented dementia, no behavioral issues  Patient Active Problem List   Diagnosis Date Noted  . Decubitus ulcer of sacral region, stage 1 05/23/2014  . Anemia 02/12/2014  . Essential hypertension, benign 03/30/2013  . Hyperlipidemia 12/13/2012  . Vascular dementia without behavioral disturbance 12/13/2012  . CVA, old, hemiparesis 07/13/2012  . FTT (failure to thrive) in adult 07/13/2012    CBC    Component Value Date/Time   WBC 4.5 10/01/2013   WBC 4.5 10/01/2013   WBC 9.1 07/14/2012 0700   RBC 3.72* 07/14/2012 0700   RBC 4.65 07/08/2012 1757   HGB 12.1 10/01/2013   HGB 12.1 10/01/2013   HCT 37 10/01/2013   HCT 37 10/01/2013   PLT 179 10/01/2013   PLT 179 10/01/2013   MCV 86.6 07/14/2012 0700   LYMPHSABS 1.7 07/12/2012 2137   LYMPHSABS 2.0 07/08/2012 1757   MONOABS 0.7 07/12/2012 2137   EOSABS 0.0 07/12/2012 2137   EOSABS 0.0 07/08/2012 1757   BASOSABS 0.0 07/12/2012 2137   BASOSABS 0.0 07/08/2012 1757    CMP     Component Value Date/Time   NA 138 10/01/2013   NA 143 07/14/2012 0700   K 3.9 10/01/2013   CL 107 07/14/2012 0700   CO2 28 07/14/2012 0700   GLUCOSE 153* 07/14/2012 0700   GLUCOSE 108* 07/08/2012 1757   BUN 12 10/01/2013   BUN 13 07/14/2012 0700   CREATININE 0.7  10/01/2013   CREATININE 0.62 07/14/2012 0700   CALCIUM 8.5 07/14/2012 0700   PROT 6.6 07/12/2012 2137   PROT 6.4 07/08/2012 1757   ALBUMIN 2.9* 07/12/2012 2137   AST 15 10/01/2013   ALT 9 10/01/2013   ALKPHOS 90 10/01/2013   BILITOT 0.4 07/12/2012 2137   GFRNONAA 82* 07/14/2012 0700   GFRAA >90 07/14/2012 0700    Assessment and Plan  Essential hypertension, benign Controlled on Lisinopril 10 mg.   Vascular dementia without behavioral disturbance No reported major declines; Plan continue namenda   CVA, old, hemiparesis Chronic and stable with no changes in R hemiparesis   Decubitus ulcer of sacral region,  stage 1 Very superficial and being dressed by nursing on daily basis.If progresses, which it does not appear it will do, will consult wound care.     Margit HanksALEXANDER, Zae Kirtz D, MD

## 2014-05-23 ENCOUNTER — Encounter: Payer: Self-pay | Admitting: Internal Medicine

## 2014-05-23 DIAGNOSIS — L89151 Pressure ulcer of sacral region, stage 1: Secondary | ICD-10-CM | POA: Insufficient documentation

## 2014-05-23 NOTE — Assessment & Plan Note (Signed)
No reported major declines; Plan continue namenda

## 2014-05-23 NOTE — Assessment & Plan Note (Signed)
Chronic and stable with no changes in R hemiparesis

## 2014-05-23 NOTE — Assessment & Plan Note (Signed)
Controlled on Lisinopril 10mg  

## 2014-05-23 NOTE — Assessment & Plan Note (Signed)
Very superficial and being dressed by nursing on daily basis.If progresses, which it does not appear it will do, will consult wound care.

## 2014-06-21 ENCOUNTER — Non-Acute Institutional Stay (SKILLED_NURSING_FACILITY): Payer: Medicare Other | Admitting: Nurse Practitioner

## 2014-06-21 DIAGNOSIS — R627 Adult failure to thrive: Secondary | ICD-10-CM | POA: Diagnosis not present

## 2014-06-21 DIAGNOSIS — I69359 Hemiplegia and hemiparesis following cerebral infarction affecting unspecified side: Secondary | ICD-10-CM | POA: Diagnosis not present

## 2014-06-21 DIAGNOSIS — F0150 Vascular dementia without behavioral disturbance: Secondary | ICD-10-CM | POA: Diagnosis not present

## 2014-06-21 DIAGNOSIS — I10 Essential (primary) hypertension: Secondary | ICD-10-CM

## 2014-06-21 NOTE — Progress Notes (Signed)
Patient ID: Tracie [name], female   DOB: 27-Apr-1928, 81 y.o.   MRN: [id number]    Nursing Home Location:  [place] of Service: SNF (31)  PCP: Estill Dooms, MD  No Known Allergies  Chief Complaint  Patient presents with  . Medical Management of Chronic Issues    HPI:  Patient is a 81 y.o. female seen today at [facility] for routine follow up on chronic conditions. Pt with  pmh of advanced dementia, CVA with RT sided hemiparesis, htn,weight loss. Pt has had no acute concerns in the last month. Needing total care by nursing staff and is fed at meal times. Nursing does not have any concerns at this time.  Review of Systems:  Review of Systems  Unable to perform ROS: Dementia    Past Medical History  Diagnosis Date  . Stroke   . Dementia   . Hypertension   . Laceration of skin of eyelid and periocular area   . Other fall   . Wheelchair dependence   . Hypopotassemia   . Encounters for unspecified administrative purpose   . Other and unspecified hyperlipidemia   . Reflux esophagitis   . Blood in stool   . Routine general medical examination at a health care facility   . Hypopotassemia   . Dementia in conditions classified elsewhere with behavioral disturbance   . Dementia in conditions classified elsewhere without behavioral disturbance   . Loss of weight   . Cough   . Unspecified vitamin D deficiency   . Thrombocytopenia, unspecified   . Unspecified disorder of kidney and ureter   . Other and unspecified hyperlipidemia   . Senile dementia, uncomplicated   . Anxiety state, unspecified   . Unspecified essential hypertension   . Unspecified late effects of cerebrovascular disease   . Other malaise and fatigue   . Abnormality of gait   . Cerebral contusion 05/04/2012  . Tremors of nervous system 11/11/2012   Past Surgical History  Procedure Laterality Date  . Joint replacement  2008    Dr Eddie Dibbles  . Abdominal hysterectomy  1981     Social History:   reports that she has never smoked. She has never used smokeless tobacco. She reports that she does not drink alcohol or use illicit drugs.  No family history on file.  Medications: Patient's Medications  New Prescriptions   No medications on file  Previous Medications   ACETAMINOPHEN (TYLENOL) 325 MG TABLET    Take 650 mg by mouth every 6 (six) hours as needed for fever (greater than 101.0).   ACETAMINOPHEN (TYLENOL) 650 MG CR TABLET    Take 650 mg by mouth every 8 (eight) hours as needed for pain.   ALPRAZOLAM (XANAX) 0.25 MG TABLET    Take one tablet by mouth every 8 hours as needed for anixety   ASPIRIN 81 MG CHEWABLE TABLET    Chew 81 mg by mouth daily. For CVA   CHOLECALCIFEROL 2000 UNITS TABS    Take 2,000 tablets by mouth daily. For calcium supplement   LISINOPRIL (PRINIVIL,ZESTRIL) 10 MG TABLET    Take 10 mg by mouth daily. For HTN   MAGNESIUM HYDROXIDE (MILK OF MAGNESIA) 800 MG/5ML SUSPENSION    Take 30 mLs by mouth daily as needed for constipation.   MEMANTINE HCL ER 28 MG CP24    Take 28 mg by mouth daily. to help preserve memory.   SODIUM PHOSPHATES (RA SALINE ENEMA RE)  Place rectally as needed.  Modified Medications   No medications on file  Discontinued Medications   No medications on file     Physical Exam: Filed Vitals:   06/21/14 1546  BP: 138/76  Pulse: 78  Temp: 96.8 F (36 C)  Resp: 20  Weight: 98 lb 12.8 oz (44.815 kg)    Physical Exam  Constitutional: No distress.  Thin. Frail.  HENT:  Mouth/Throat: Oropharynx is clear and moist.  Neck: Neck supple.  Cardiovascular: Normal rate, regular rhythm and normal heart sounds.   Pulmonary/Chest: Effort normal and breath sounds normal.  Abdominal: Soft. Bowel sounds are normal.  Musculoskeletal: She exhibits no edema or tenderness.  Right hemiparesis  Neurological: She is alert.  Right hemiparesis. Significant dementia.   Skin: Skin is warm and dry.  Psychiatric: Her behavior is  normal.    Labs reviewed: Basic Metabolic Panel:  Recent Labs  08/30/13 10/01/13  NA 138 138  K 3.9 3.9  BUN 12 12  CREATININE 0.7 0.7   Liver Function Tests:  Recent Labs  10/01/13  AST 15  ALT 9  ALKPHOS 90   No results for input(s): LIPASE, AMYLASE in the last 8760 hours. No results for input(s): AMMONIA in the last 8760 hours. CBC:  Recent Labs  10/01/13  WBC 4.5  4.5  HGB 12.1  12.1  HCT 37  37  PLT 179  179   TSH: No results for input(s): TSH in the last 8760 hours. A1C: Lab Results  Component Value Date   HGBA1C 6.5* 07/13/2012   Lipid Panel:  Recent Labs  10/01/13  CHOL 174  174  HDL 54  54  LDLCALC 113  113  TRIG 35*     WBC 5.3     4.0-10.5 K/uL SLN   RBC 4.11     3.87-5.11 MIL/uL SLN   Hemoglobin 11.7   L 12.0-15.0 g/dL SLN   Hematocrit 35.9   L 36.0-46.0 % SLN   MCV 87.3     78.0-100.0 fL SLN   MCH 28.5     26.0-34.0 pg SLN   MCHC 32.6     30.0-36.0 g/dL SLN   RDW 13.4     11.5-15.5 % SLN   Platelet Count 174     150-400 K/uL SLN   Granulocyte % 51     43-77 % SLN   Absolute Gran 2.7     1.7-7.7 K/uL SLN   Lymph % 34     12-46 % SLN   Absolute Lymph 1.8     0.7-4.0 K/uL SLN   Mono % 12     3-12 % SLN   Absolute Mono 0.6     0.1-1.0 K/uL SLN   Eos % 3     0-5 % SLN   Absolute Eos 0.2     0.0-0.7 K/uL SLN   Baso % 0     0-1 % SLN   Absolute Baso 0.0     0.0-0.1 K/uL SLN   Smear Review Criteria for review not met  SLN   Lipid Panel    Result: 01/30/2014 4:54 PM   ( Status: F )       Cholesterol 173     0-200 mg/dL SLN C Triglyceride 48     <150 mg/dL SLN   HDL Cholesterol 46     >39 mg/dL SLN   Total Chol/HDL Ratio 3.8      Ratio SLN   VLDL Cholesterol (Calc) 10  0-40 mg/dL SLN   LDL Cholesterol (Calc) 117   H 0-99 mg/dL SLN C Hepatic Function Panel    Result: 01/30/2014 4:54 PM   ( Status: F )       Bilirubin, Total 0.4     0.2-1.2 mg/dL SLN   Bilirubin, Direct 0.1     0.0-0.3 mg/dL SLN   Indirect Bilirubin 0.3       0.2-1.2 mg/dL SLN   Alkaline Phosphatase 83     39-117 U/L SLN   AST/SGOT 12     0-37 U/L SLN   ALT/SGPT <8     0-35 U/L SLN   Total Protein 6.1     6.0-8.3 g/dL SLN   Albumin 3.4   L 3.5-5.2 g/dL SLN  Basic Metabolic Panel    Result: 02/08/2014 4:56 PM   ( Status: F )       Sodium 140     135-145 mEq/L SLN   Potassium 3.8     3.5-5.3 mEq/L SLN   Chloride 106     96-112 mEq/L SLN   CO2 26     19-32 mEq/L SLN   Glucose 95     70-99 mg/dL SLN   BUN 12     6-23 mg/dL SLN   Creatinine 0.73     0.50-1.10 mg/dL SLN   Calcium 8.6     8.4-10.5 mg/dL SLN Assessment/Plan  1. Essential hypertension, benign Blood pressure well controlled at this time, On lisinopril will update BMP  2. CVA, old, hemiparesis With right sided hemiparesis, and contractures of right hand, no changes, conts ASA, will follow up CBC  3. Vascular dementia without behavioral disturbance Advanced, requiring total care by staff. No acute changes in the last month.  4. FTT (failure to thrive) in adult -due to progression of dementia, taking supplements and increased portions at meal times, weight has been stable.

## 2014-07-27 ENCOUNTER — Non-Acute Institutional Stay (SKILLED_NURSING_FACILITY): Payer: Medicare Other | Admitting: Nurse Practitioner

## 2014-07-27 DIAGNOSIS — I10 Essential (primary) hypertension: Secondary | ICD-10-CM

## 2014-07-27 DIAGNOSIS — D638 Anemia in other chronic diseases classified elsewhere: Secondary | ICD-10-CM

## 2014-07-27 DIAGNOSIS — F0150 Vascular dementia without behavioral disturbance: Secondary | ICD-10-CM | POA: Diagnosis not present

## 2014-07-27 DIAGNOSIS — I69359 Hemiplegia and hemiparesis following cerebral infarction affecting unspecified side: Secondary | ICD-10-CM | POA: Diagnosis not present

## 2014-07-27 DIAGNOSIS — R627 Adult failure to thrive: Secondary | ICD-10-CM

## 2014-07-27 DIAGNOSIS — L89151 Pressure ulcer of sacral region, stage 1: Secondary | ICD-10-CM

## 2014-07-27 NOTE — Progress Notes (Signed)
Patient ID: Tracie [name], female   DOB: [birth year], 81 y.o.   MRN: [id number]    Nursing Home Location:  [place] of Service: SNF (31)  PCP: Estill Dooms, MD  No Known Allergies  Chief Complaint  Patient presents with  . Medical Management of Chronic Issues    HPI:  Patient is a 81 y.o. female seen today at [facility] for routine follow up on chronic conditions. Pt with  pmh of advanced dementia, CVA with RT sided hemiparesis, htn,weight loss. Pt with ongoing sacral wound. Treatment nurse and RD following. Recently with changes in dressing and MVI with increased in prostat implemented.   Review of Systems:  Review of Systems  Unable to perform ROS: Dementia    Past Medical History  Diagnosis Date  . Stroke   . Dementia   . Hypertension   . Laceration of skin of eyelid and periocular area   . Other fall   . Wheelchair dependence   . Hypopotassemia   . Encounters for unspecified administrative purpose   . Other and unspecified hyperlipidemia   . Reflux esophagitis   . Blood in stool   . Routine general medical examination at a health care facility   . Hypopotassemia   . Dementia in conditions classified elsewhere with behavioral disturbance   . Dementia in conditions classified elsewhere without behavioral disturbance   . Loss of weight   . Cough   . Unspecified vitamin D deficiency   . Thrombocytopenia, unspecified   . Unspecified disorder of kidney and ureter   . Other and unspecified hyperlipidemia   . Senile dementia, uncomplicated   . Anxiety state, unspecified   . Unspecified essential hypertension   . Unspecified late effects of cerebrovascular disease   . Other malaise and fatigue   . Abnormality of gait   . Cerebral contusion 05/04/2012  . Tremors of nervous system 11/11/2012   Past Surgical History  Procedure Laterality Date  . Joint replacement  2008    Dr Eddie Dibbles  . Abdominal hysterectomy  1981   Social  History:   reports that she has never smoked. She has never used smokeless tobacco. She reports that she does not drink alcohol or use illicit drugs.  No family history on file.  Medications: Patient's Medications  New Prescriptions   No medications on file  Previous Medications   ACETAMINOPHEN (TYLENOL) 325 MG TABLET    Take 650 mg by mouth every 6 (six) hours as needed for fever (greater than 101.0).   ACETAMINOPHEN (TYLENOL) 650 MG CR TABLET    Take 650 mg by mouth every 8 (eight) hours as needed for pain.   ALPRAZOLAM (XANAX) 0.25 MG TABLET    Take one tablet by mouth every 8 hours as needed for anixety   ASPIRIN 81 MG CHEWABLE TABLET    Chew 81 mg by mouth daily. For CVA   CHOLECALCIFEROL 2000 UNITS TABS    Take 2,000 tablets by mouth daily. For calcium supplement   LISINOPRIL (PRINIVIL,ZESTRIL) 10 MG TABLET    Take 10 mg by mouth daily. For HTN   MAGNESIUM HYDROXIDE (MILK OF MAGNESIA) 800 MG/5ML SUSPENSION    Take 30 mLs by mouth daily as needed for constipation.   MEMANTINE HCL ER 28 MG CP24    Take 28 mg by mouth daily. to help preserve memory.   SODIUM PHOSPHATES (RA SALINE ENEMA RE)    Place rectally as needed.  Modified  Medications   No medications on file  Discontinued Medications   No medications on file     Physical Exam: Filed Vitals:   07/27/14 1117  BP: 130/70  Pulse: 72  Temp: 97.5 F (36.4 C)  Resp: 20  Weight: 94 lb (42.638 kg)    Physical Exam  Constitutional: No distress.  Thin. Frail.  HENT:  Does not open mouth to allow for exam  Neck: Neck supple.  Cardiovascular: Normal rate, regular rhythm and normal heart sounds.   Pulmonary/Chest: Effort normal and breath sounds normal.  Abdominal: Soft. Bowel sounds are normal.  Musculoskeletal: She exhibits no edema or tenderness.  Right hemiparesis. Contractures noted at wrist bilaterally  Neurological: She is alert.  Right hemiparesis. Significant dementia.   Skin: Skin is warm and dry.  Psychiatric:  Her behavior is normal.    Labs reviewed: Basic Metabolic Panel:  Recent Labs  08/30/13 10/01/13  NA 138 138  K 3.9 3.9  BUN 12 12  CREATININE 0.7 0.7   Liver Function Tests:  Recent Labs  10/01/13  AST 15  ALT 9  ALKPHOS 90   No results for input(s): LIPASE, AMYLASE in the last 8760 hours. No results for input(s): AMMONIA in the last 8760 hours. CBC:  Recent Labs  10/01/13  WBC 4.5  4.5  HGB 12.1  12.1  HCT 37  37  PLT 179  179   TSH: No results for input(s): TSH in the last 8760 hours. A1C: Lab Results  Component Value Date   HGBA1C 6.5* 07/13/2012   Lipid Panel:  Recent Labs  10/01/13  CHOL 174  174  HDL 54  54  LDLCALC 113  113  TRIG 35*     WBC 5.3     4.0-10.5 K/uL SLN   RBC 4.11     3.87-5.11 MIL/uL SLN   Hemoglobin 11.7   L 12.0-15.0 g/dL SLN   Hematocrit 35.9   L 36.0-46.0 % SLN   MCV 87.3     78.0-100.0 fL SLN   MCH 28.5     26.0-34.0 pg SLN   MCHC 32.6     30.0-36.0 g/dL SLN   RDW 13.4     11.5-15.5 % SLN   Platelet Count 174     150-400 K/uL SLN   Granulocyte % 51     43-77 % SLN   Absolute Gran 2.7     1.7-7.7 K/uL SLN   Lymph % 34     12-46 % SLN   Absolute Lymph 1.8     0.7-4.0 K/uL SLN   Mono % 12     3-12 % SLN   Absolute Mono 0.6     0.1-1.0 K/uL SLN   Eos % 3     0-5 % SLN   Absolute Eos 0.2     0.0-0.7 K/uL SLN   Baso % 0     0-1 % SLN   Absolute Baso 0.0     0.0-0.1 K/uL SLN   Smear Review Criteria for review not met  SLN   Lipid Panel    Result: 01/30/2014 4:54 PM   ( Status: F )       Cholesterol 173     0-200 mg/dL SLN C Triglyceride 48     <150 mg/dL SLN   HDL Cholesterol 46     >39 mg/dL SLN   Total Chol/HDL Ratio 3.8      Ratio SLN   VLDL Cholesterol (Calc) 10  0-40 mg/dL SLN   LDL Cholesterol (Calc) 117   H 0-99 mg/dL SLN C Hepatic Function Panel    Result: 01/30/2014 4:54 PM   ( Status: F )       Bilirubin, Total 0.4     0.2-1.2 mg/dL SLN   Bilirubin, Direct 0.1     0.0-0.3 mg/dL SLN   Indirect  Bilirubin 0.3     0.2-1.2 mg/dL SLN   Alkaline Phosphatase 83     39-117 U/L SLN   AST/SGOT 12     0-37 U/L SLN   ALT/SGPT <8     0-35 U/L SLN   Total Protein 6.1     6.0-8.3 g/dL SLN   Albumin 3.4   L 3.5-5.2 g/dL SLN  Basic Metabolic Panel    Result: 02/08/2014 4:56 PM   ( Status: F )       Sodium 140     135-145 mEq/L SLN   Potassium 3.8     3.5-5.3 mEq/L SLN   Chloride 106     96-112 mEq/L SLN   CO2 26     19-32 mEq/L SLN   Glucose 95     70-99 mg/dL SLN   BUN 12     6-23 mg/dL SLN   Creatinine 0.73     0.50-1.10 mg/dL SLN   Calcium 8.6     8.4-10.5 mg/dL SLN Assessment/Plan  1. Essential hypertension, benign Blood pressure well controlled on lisinopril at this time,  2. CVA, old, hemiparesis With right sided hemiparesis, and contractures of right hand, no changes, conts ASA, hgb stable  3. Vascular dementia without behavioral disturbance Advanced, requiring total care by staff. No acute changes in the last month. Will dc namenda as it is not providing any additional benefit at this time.   4. FTT (failure to thrive) in adult -due to progression of dementia, taking supplements and increased portions at meal times, weight remains been stable. Supplements increased due to sacral wound  5. Anemia CBC obtained in march, hgb remains stable  6. Decubitus ulcer of sacral region, stage 1 -conts to be followed closely by treatment nurse and RD

## 2014-08-26 ENCOUNTER — Non-Acute Institutional Stay (SKILLED_NURSING_FACILITY): Payer: Medicare Other | Admitting: Nurse Practitioner

## 2014-08-26 DIAGNOSIS — F0150 Vascular dementia without behavioral disturbance: Secondary | ICD-10-CM | POA: Diagnosis not present

## 2014-08-26 DIAGNOSIS — I69359 Hemiplegia and hemiparesis following cerebral infarction affecting unspecified side: Secondary | ICD-10-CM | POA: Diagnosis not present

## 2014-08-26 DIAGNOSIS — L89151 Pressure ulcer of sacral region, stage 1: Secondary | ICD-10-CM | POA: Diagnosis not present

## 2014-08-26 DIAGNOSIS — I10 Essential (primary) hypertension: Secondary | ICD-10-CM

## 2014-08-26 NOTE — Progress Notes (Signed)
Patient ID: Tracie [name], female   DOB: 1929-02-16, 81 y.o.   MRN: [id number]    Nursing Home Location:  [place] of Service: SNF (31)  PCP: Estill Dooms, MD  No Known Allergies  Chief Complaint  Patient presents with  . Medical Management of Chronic Issues    HPI:  Patient is a 81 y.o. female seen today at [facility] for routine follow up on chronic conditions. Pt with  pmh of advanced dementia, CVA with RT sided hemiparesis, htn,weight loss. Pt with ongoing sacral wound. Treatment nurse and wound care NP following, pt also being followed closely by RD and she is getting double portions of her meals and eating majority of meals with assistance from staff. Pt with good bowel movements. No signs of pain noted. Staff without new concerns at this time.  Review of Systems:  Review of Systems  Unable to perform ROS: Dementia    Past Medical History  Diagnosis Date  . Stroke   . Dementia   . Hypertension   . Laceration of skin of eyelid and periocular area   . Other fall   . Wheelchair dependence   . Hypopotassemia   . Encounters for unspecified administrative purpose   . Other and unspecified hyperlipidemia   . Reflux esophagitis   . Blood in stool   . Routine general medical examination at a health care facility   . Hypopotassemia   . Dementia in conditions classified elsewhere with behavioral disturbance   . Dementia in conditions classified elsewhere without behavioral disturbance   . Loss of weight   . Cough   . Unspecified vitamin D deficiency   . Thrombocytopenia, unspecified   . Unspecified disorder of kidney and ureter   . Other and unspecified hyperlipidemia   . Senile dementia, uncomplicated   . Anxiety state, unspecified   . Unspecified essential hypertension   . Unspecified late effects of cerebrovascular disease   . Other malaise and fatigue   . Abnormality of gait   . Cerebral contusion 05/04/2012  . Tremors of  nervous system 11/11/2012   Past Surgical History  Procedure Laterality Date  . Joint replacement  2008    Dr Eddie Dibbles  . Abdominal hysterectomy  1981   Social History:   reports that she has never smoked. She has never used smokeless tobacco. She reports that she does not drink alcohol or use illicit drugs.  No family history on file.  Medications: Patient's Medications  New Prescriptions   No medications on file  Previous Medications   ACETAMINOPHEN (TYLENOL) 325 MG TABLET    Take 650 mg by mouth every 6 (six) hours as needed for fever (greater than 101.0).   ACETAMINOPHEN (TYLENOL) 650 MG CR TABLET    Take 650 mg by mouth every 8 (eight) hours as needed for pain.   ALPRAZOLAM (XANAX) 0.25 MG TABLET    Take one tablet by mouth every 8 hours as needed for anixety   ASPIRIN 81 MG CHEWABLE TABLET    Chew 81 mg by mouth daily. For CVA   CHOLECALCIFEROL 2000 UNITS TABS    Take 2,000 tablets by mouth daily. For calcium supplement   LISINOPRIL (PRINIVIL,ZESTRIL) 10 MG TABLET    Take 10 mg by mouth daily. For HTN   MAGNESIUM HYDROXIDE (MILK OF MAGNESIA) 800 MG/5ML SUSPENSION    Take 30 mLs by mouth daily as needed for constipation.   SODIUM PHOSPHATES (RA SALINE ENEMA  RE)    Place rectally as needed.  Modified Medications   No medications on file  Discontinued Medications   No medications on file     Physical Exam: Filed Vitals:   08/26/14 1940  BP: 121/58  Pulse: 66  Temp: 98.2 F (36.8 C)  Resp: 18  Weight: 96 lb (43.545 kg)    Physical Exam  Constitutional: No distress.  Thin. Frail.  HENT:  Does not open mouth to allow for exam  Neck: Neck supple.  Cardiovascular: Normal rate, regular rhythm and normal heart sounds.   Pulmonary/Chest: Effort normal and breath sounds normal.  Abdominal: Soft. Bowel sounds are normal.  Musculoskeletal: She exhibits no edema or tenderness.  Right hemiparesis. Contractures noted at wrist bilaterally  Neurological: She is alert.  Right  hemiparesis. Significant dementia.   Skin: Skin is warm and dry.  Psychiatric: Her behavior is normal.    Labs reviewed: Basic Metabolic Panel:  Recent Labs  08/30/13 10/01/13  NA 138 138  K 3.9 3.9  BUN 12 12  CREATININE 0.7 0.7   Liver Function Tests:  Recent Labs  10/01/13  AST 15  ALT 9  ALKPHOS 90   No results for input(s): LIPASE, AMYLASE in the last 8760 hours. No results for input(s): AMMONIA in the last 8760 hours. CBC:  Recent Labs  10/01/13  WBC 4.5  4.5  HGB 12.1  12.1  HCT 37  37  PLT 179  179   TSH: No results for input(s): TSH in the last 8760 hours. A1C: Lab Results  Component Value Date   HGBA1C 6.5* 07/13/2012   Lipid Panel:  Recent Labs  10/01/13  CHOL 174  174  HDL 54  54  LDLCALC 113  113  TRIG 35*     WBC 5.3     4.0-10.5 K/uL SLN   RBC 4.11     3.87-5.11 MIL/uL SLN   Hemoglobin 11.7   L 12.0-15.0 g/dL SLN   Hematocrit 35.9   L 36.0-46.0 % SLN   MCV 87.3     78.0-100.0 fL SLN   MCH 28.5     26.0-34.0 pg SLN   MCHC 32.6     30.0-36.0 g/dL SLN   RDW 13.4     11.5-15.5 % SLN   Platelet Count 174     150-400 K/uL SLN   Granulocyte % 51     43-77 % SLN   Absolute Gran 2.7     1.7-7.7 K/uL SLN   Lymph % 34     12-46 % SLN   Absolute Lymph 1.8     0.7-4.0 K/uL SLN   Mono % 12     3-12 % SLN   Absolute Mono 0.6     0.1-1.0 K/uL SLN   Eos % 3     0-5 % SLN   Absolute Eos 0.2     0.0-0.7 K/uL SLN   Baso % 0     0-1 % SLN   Absolute Baso 0.0     0.0-0.1 K/uL SLN   Smear Review Criteria for review not met  SLN   Lipid Panel    Result: 01/30/2014 4:54 PM   ( Status: F )       Cholesterol 173     0-200 mg/dL SLN C Triglyceride 48     <150 mg/dL SLN   HDL Cholesterol 46     >39 mg/dL SLN   Total Chol/HDL Ratio 3.8      Ratio  SLN   VLDL Cholesterol (Calc) 10     0-40 mg/dL SLN   LDL Cholesterol (Calc) 117   H 0-99 mg/dL SLN C Hepatic Function Panel    Result: 01/30/2014 4:54 PM   ( Status: F )       Bilirubin, Total 0.4       0.2-1.2 mg/dL SLN   Bilirubin, Direct 0.1     0.0-0.3 mg/dL SLN   Indirect Bilirubin 0.3     0.2-1.2 mg/dL SLN   Alkaline Phosphatase 83     39-117 U/L SLN   AST/SGOT 12     0-37 U/L SLN   ALT/SGPT <8     0-35 U/L SLN   Total Protein 6.1     6.0-8.3 g/dL SLN   Albumin 3.4   L 3.5-5.2 g/dL SLN  Basic Metabolic Panel    Result: 02/08/2014 4:56 PM   ( Status: F )       Sodium 140     135-145 mEq/L SLN   Potassium 3.8     3.5-5.3 mEq/L SLN   Chloride 106     96-112 mEq/L SLN   CO2 26     19-32 mEq/L SLN   Glucose 95     70-99 mg/dL SLN   BUN 12     6-23 mg/dL SLN   Creatinine 0.73     0.50-1.10 mg/dL SLN   Calcium 8.6     8.4-10.5 mg/dL SLN    Test Description Results   Abnormal Reference Range Units Lab Note CBC with Diff    Result: 06/22/2014 9:49 PM   ( Status: F )     C WBC 4.9     4.0-10.5 K/uL SLN   RBC 4.10     3.87-5.11 MIL/uL SLN   Hemoglobin 11.7   L 12.0-15.0 g/dL SLN   Hematocrit 34.9   L 36.0-46.0 % SLN   MCV 85.1     78.0-100.0 fL SLN   MCH 28.5     26.0-34.0 pg SLN   MCHC 33.5     30.0-36.0 g/dL SLN   RDW 13.8     11.5-15.5 % SLN   Platelet Count 157     150-400 K/uL SLN   MPV 10.6     8.6-12.4 fL SLN   Granulocyte % 65     43-77 % SLN   Absolute Gran 3.2     1.7-7.7 K/uL SLN   Lymph % 27     12-46 % SLN   Absolute Lymph 1.3     0.7-4.0 K/uL SLN   Mono % 7     3-12 % SLN   Absolute Mono 0.3     0.1-1.0 K/uL SLN   Eos % 1     0-5 % SLN   Absolute Eos 0.0     0.0-0.7 K/uL SLN   Baso % 0     0-1 % SLN   Absolute Baso 0.0     0.0-0.1 K/uL SLN   Smear Review Criteria for review not met  SLN   Basic Metabolic Panel    Result: 06/23/2014 1:43 AM   ( Status: F )       Sodium 141     135-145 mEq/L SLN   Potassium 3.7     3.5-5.3 mEq/L SLN   Chloride 106     96-112 mEq/L SLN   CO2 27     19-32 mEq/L SLN   Glucose 100   H 70-99 mg/dL SLN  BUN 13     6-23 mg/dL SLN   Creatinine 0.65     0.50-1.10 mg/dL SLN   Calcium 9.2      8.4-10.5 mg/dL SLN  Assessment/Plan   1. Decubitus ulcer of sacral region, stage 1 Stable, conts to be followed by RD and wound care team   2. Vascular dementia without behavioral disturbance Advanced dementia which has remained stable over the past months  3. CVA, old, hemiparesis -with contractures noted and right sided hemiplegia  -conts on ASA daily  4. Essential hypertension, benign Controlled on lisinopril, labs stable in march

## 2014-09-30 ENCOUNTER — Non-Acute Institutional Stay (SKILLED_NURSING_FACILITY): Payer: Medicare Other | Admitting: Nurse Practitioner

## 2014-09-30 DIAGNOSIS — I69359 Hemiplegia and hemiparesis following cerebral infarction affecting unspecified side: Secondary | ICD-10-CM

## 2014-09-30 DIAGNOSIS — I10 Essential (primary) hypertension: Secondary | ICD-10-CM | POA: Diagnosis not present

## 2014-09-30 DIAGNOSIS — F0150 Vascular dementia without behavioral disturbance: Secondary | ICD-10-CM | POA: Diagnosis not present

## 2014-09-30 DIAGNOSIS — K5900 Constipation, unspecified: Secondary | ICD-10-CM | POA: Diagnosis not present

## 2014-09-30 DIAGNOSIS — L89151 Pressure ulcer of sacral region, stage 1: Secondary | ICD-10-CM | POA: Diagnosis not present

## 2014-09-30 NOTE — Progress Notes (Signed)
Patient ID: Tracie [name], female   DOB: 03/16/1929, 81 y.o.   MRN: [id number]    Nursing Home Location:  [place] of Service: SNF (31)  PCP: Estill Dooms, MD  No Known Allergies  Chief Complaint  Patient presents with  . Medical Management of Chronic Issues    HPI:  Patient is a 81 y.o. female seen today at [facility] for routine follow up on chronic conditions. Pt with  pmh of advanced dementia, CVA with RT sided hemiparesis, htn,weight loss. Pt with ongoing sacral wound which has been healing. Pt eating 100% of her meals most of the time. requiring to be fed and puree diet.  No signs of anxiety or depression. No changes in bowel or bladder habit. Nursing without acute concerns at this time Review of Systems:  Review of Systems  Unable to perform ROS: Dementia    Past Medical History  Diagnosis Date  . Stroke   . Dementia   . Hypertension   . Laceration of skin of eyelid and periocular area   . Other fall   . Wheelchair dependence   . Hypopotassemia   . Encounters for unspecified administrative purpose   . Other and unspecified hyperlipidemia   . Reflux esophagitis   . Blood in stool   . Routine general medical examination at a health care facility   . Hypopotassemia   . Dementia in conditions classified elsewhere with behavioral disturbance   . Dementia in conditions classified elsewhere without behavioral disturbance   . Loss of weight   . Cough   . Unspecified vitamin D deficiency   . Thrombocytopenia, unspecified   . Unspecified disorder of kidney and ureter   . Other and unspecified hyperlipidemia   . Senile dementia, uncomplicated   . Anxiety state, unspecified   . Unspecified essential hypertension   . Unspecified late effects of cerebrovascular disease   . Other malaise and fatigue   . Abnormality of gait   . Cerebral contusion 05/04/2012  . Tremors of nervous system 11/11/2012   Past Surgical History    Procedure Laterality Date  . Joint replacement  2008    Dr Eddie Dibbles  . Abdominal hysterectomy  1981   Social History:   reports that she has never smoked. She has never used smokeless tobacco. She reports that she does not drink alcohol or use illicit drugs.  No family history on file.  Medications: Patient's Medications  New Prescriptions   No medications on file  Previous Medications   ACETAMINOPHEN (TYLENOL) 325 MG TABLET    Take 650 mg by mouth every 6 (six) hours as needed for fever (greater than 101.0).   ACETAMINOPHEN (TYLENOL) 650 MG CR TABLET    Take 650 mg by mouth every 8 (eight) hours as needed for pain.   ALPRAZOLAM (XANAX) 0.25 MG TABLET    Take one tablet by mouth every 8 hours as needed for anixety   ASPIRIN 81 MG CHEWABLE TABLET    Chew 81 mg by mouth daily. For CVA   CHOLECALCIFEROL 2000 UNITS TABS    Take 2,000 tablets by mouth daily. For calcium supplement   LISINOPRIL (PRINIVIL,ZESTRIL) 10 MG TABLET    Take 10 mg by mouth daily. For HTN   MAGNESIUM HYDROXIDE (MILK OF MAGNESIA) 800 MG/5ML SUSPENSION    Take 30 mLs by mouth daily as needed for constipation.   SODIUM PHOSPHATES (RA SALINE ENEMA RE)    Place rectally as  needed.  Modified Medications   No medications on file  Discontinued Medications   No medications on file     Physical Exam: Filed Vitals:   09/30/14 1335  BP: 120/68  Pulse: 76  Temp: 97 F (36.1 C)  Resp: 20  Weight: 94 lb 8 oz (42.865 kg)    Physical Exam  Constitutional: No distress.  Thin. Frail.  HENT:  Mouth/Throat: Oropharynx is clear and moist. No oropharyngeal exudate.  Neck: Neck supple.  Cardiovascular: Normal rate, regular rhythm and normal heart sounds.   Pulmonary/Chest: Effort normal and breath sounds normal.  Abdominal: Soft. Bowel sounds are normal.  Musculoskeletal: She exhibits no edema or tenderness.  Right hemiparesis. Contractures noted at wrist bilaterally  Neurological: She is alert.  Right hemiparesis.  Advanced dementia.   Skin: Skin is warm and dry.  Psychiatric: Her behavior is normal.    Labs reviewed: Basic Metabolic Panel:  Recent Labs  10/01/13  NA 138  K 3.9  BUN 12  CREATININE 0.7   Liver Function Tests:  Recent Labs  10/01/13  AST 15  ALT 9  ALKPHOS 90   No results for input(s): LIPASE, AMYLASE in the last 8760 hours. No results for input(s): AMMONIA in the last 8760 hours. CBC:  Recent Labs  10/01/13  WBC 4.5  4.5  HGB 12.1  12.1  HCT 37  37  PLT 179  179   TSH: No results for input(s): TSH in the last 8760 hours. A1C: Lab Results  Component Value Date   HGBA1C 6.5* 07/13/2012   Lipid Panel:  Recent Labs  10/01/13  CHOL 174  174  HDL 54  54  LDLCALC 113  113  TRIG 35*     WBC 5.3     4.0-10.5 K/uL SLN   RBC 4.11     3.87-5.11 MIL/uL SLN   Hemoglobin 11.7   L 12.0-15.0 g/dL SLN   Hematocrit 35.9   L 36.0-46.0 % SLN   MCV 87.3     78.0-100.0 fL SLN   MCH 28.5     26.0-34.0 pg SLN   MCHC 32.6     30.0-36.0 g/dL SLN   RDW 13.4     11.5-15.5 % SLN   Platelet Count 174     150-400 K/uL SLN   Granulocyte % 51     43-77 % SLN   Absolute Gran 2.7     1.7-7.7 K/uL SLN   Lymph % 34     12-46 % SLN   Absolute Lymph 1.8     0.7-4.0 K/uL SLN   Mono % 12     3-12 % SLN   Absolute Mono 0.6     0.1-1.0 K/uL SLN   Eos % 3     0-5 % SLN   Absolute Eos 0.2     0.0-0.7 K/uL SLN   Baso % 0     0-1 % SLN   Absolute Baso 0.0     0.0-0.1 K/uL SLN   Smear Review Criteria for review not met  SLN   Lipid Panel    Result: 01/30/2014 4:54 PM   ( Status: F )       Cholesterol 173     0-200 mg/dL SLN C Triglyceride 48     <150 mg/dL SLN   HDL Cholesterol 46     >39 mg/dL SLN   Total Chol/HDL Ratio 3.8      Ratio SLN   VLDL Cholesterol (Calc) 10  0-40 mg/dL SLN   LDL Cholesterol (Calc) 117   H 0-99 mg/dL SLN C Hepatic Function Panel    Result: 01/30/2014 4:54 PM   ( Status: F )       Bilirubin, Total 0.4     0.2-1.2 mg/dL SLN   Bilirubin,  Direct 0.1     0.0-0.3 mg/dL SLN   Indirect Bilirubin 0.3     0.2-1.2 mg/dL SLN   Alkaline Phosphatase 83     39-117 U/L SLN   AST/SGOT 12     0-37 U/L SLN   ALT/SGPT <8     0-35 U/L SLN   Total Protein 6.1     6.0-8.3 g/dL SLN   Albumin 3.4   L 3.5-5.2 g/dL SLN  Basic Metabolic Panel    Result: 02/08/2014 4:56 PM   ( Status: F )       Sodium 140     135-145 mEq/L SLN   Potassium 3.8     3.5-5.3 mEq/L SLN   Chloride 106     96-112 mEq/L SLN   CO2 26     19-32 mEq/L SLN   Glucose 95     70-99 mg/dL SLN   BUN 12     6-23 mg/dL SLN   Creatinine 0.73     0.50-1.10 mg/dL SLN   Calcium 8.6     8.4-10.5 mg/dL SLN    Test Description Results   Abnormal Reference Range Units Lab Note CBC with Diff    Result: 06/22/2014 9:49 PM   ( Status: F )     C WBC 4.9     4.0-10.5 K/uL SLN   RBC 4.10     3.87-5.11 MIL/uL SLN   Hemoglobin 11.7   L 12.0-15.0 g/dL SLN   Hematocrit 34.9   L 36.0-46.0 % SLN   MCV 85.1     78.0-100.0 fL SLN   MCH 28.5     26.0-34.0 pg SLN   MCHC 33.5     30.0-36.0 g/dL SLN   RDW 13.8     11.5-15.5 % SLN   Platelet Count 157     150-400 K/uL SLN   MPV 10.6     8.6-12.4 fL SLN   Granulocyte % 65     43-77 % SLN   Absolute Gran 3.2     1.7-7.7 K/uL SLN   Lymph % 27     12-46 % SLN   Absolute Lymph 1.3     0.7-4.0 K/uL SLN   Mono % 7     3-12 % SLN   Absolute Mono 0.3     0.1-1.0 K/uL SLN   Eos % 1     0-5 % SLN   Absolute Eos 0.0     0.0-0.7 K/uL SLN   Baso % 0     0-1 % SLN   Absolute Baso 0.0     0.0-0.1 K/uL SLN   Smear Review Criteria for review not met  SLN   Basic Metabolic Panel    Result: 06/23/2014 1:43 AM   ( Status: F )       Sodium 141     135-145 mEq/L SLN   Potassium 3.7     3.5-5.3 mEq/L SLN   Chloride 106     96-112 mEq/L SLN   CO2 27     19-32 mEq/L SLN   Glucose 100   H 70-99 mg/dL SLN   BUN 13     6-23 mg/dL SLN  Creatinine 0.65     0.50-1.10 mg/dL SLN   Calcium 9.2     8.4-10.5 mg/dL SLN  Assessment/Plan  1. Essential hypertension,  benign -stable at this time. To cont on lisinopril for management.   2. CVA, old, hemiparesis Stable at this time, cont with contractures of wrist, right worse than left.  conts on ASA daily  3. Vascular dementia without behavioral disturbance Unchanged, Advanced dementia, requiring total care, no acute changes but decline expected with progression of the disease.   4. Decubitus ulcer of sacral region, stage 1 Healing with pink tissue per wound care, supplements added and RD following as well.   5. Constipation Without any complaints, remains controlled on current regimen

## 2014-11-16 ENCOUNTER — Non-Acute Institutional Stay (SKILLED_NURSING_FACILITY): Payer: Medicare Other | Admitting: Nurse Practitioner

## 2014-11-16 DIAGNOSIS — E785 Hyperlipidemia, unspecified: Secondary | ICD-10-CM

## 2014-11-16 DIAGNOSIS — F0150 Vascular dementia without behavioral disturbance: Secondary | ICD-10-CM

## 2014-11-16 DIAGNOSIS — I10 Essential (primary) hypertension: Secondary | ICD-10-CM | POA: Diagnosis not present

## 2014-11-16 DIAGNOSIS — I69359 Hemiplegia and hemiparesis following cerebral infarction affecting unspecified side: Secondary | ICD-10-CM | POA: Diagnosis not present

## 2014-11-16 DIAGNOSIS — R627 Adult failure to thrive: Secondary | ICD-10-CM | POA: Diagnosis not present

## 2014-11-16 NOTE — Progress Notes (Signed)
Patient ID: Tracie [name], female   DOB: 28-Jan-1929, 81 y.o.   MRN: [id number]    Nursing Home Location:  [place] of Service: SNF (31)  PCP: Estill Dooms, MD  No Known Allergies  Chief Complaint  Patient presents with  . Medical Management of Chronic Issues    HPI:  Patient is a 81 y.o. female seen today at [facility] for routine follow up on chronic conditions. Pt with  pmh of advanced dementia, CVA with RT sided hemiparesis, htn,weight loss. Pt unable to participate in ROS or HPI due to advanced dementia. Pt requiring total care by staff. Pt conts to eat well with assistance of staff. Weight has been stable in the last month. No falls noted. Pt without signs of pain. nursing without acute concerns.    Review of Systems:  Review of Systems  Unable to perform ROS: Dementia    Past Medical History  Diagnosis Date  . Stroke   . Dementia   . Hypertension   . Laceration of skin of eyelid and periocular area   . Other fall   . Wheelchair dependence   . Hypopotassemia   . Encounters for unspecified administrative purpose   . Other and unspecified hyperlipidemia   . Reflux esophagitis   . Blood in stool   . Routine general medical examination at a health care facility   . Hypopotassemia   . Dementia in conditions classified elsewhere with behavioral disturbance   . Dementia in conditions classified elsewhere without behavioral disturbance   . Loss of weight   . Cough   . Unspecified vitamin D deficiency   . Thrombocytopenia, unspecified   . Unspecified disorder of kidney and ureter   . Other and unspecified hyperlipidemia   . Senile dementia, uncomplicated   . Anxiety state, unspecified   . Unspecified essential hypertension   . Unspecified late effects of cerebrovascular disease   . Other malaise and fatigue   . Abnormality of gait   . Cerebral contusion 05/04/2012  . Tremors of nervous system 11/11/2012   Past Surgical  History  Procedure Laterality Date  . Joint replacement  2008    Dr Eddie Dibbles  . Abdominal hysterectomy  1981   Social History:   reports that she has never smoked. She has never used smokeless tobacco. She reports that she does not drink alcohol or use illicit drugs.  No family history on file.  Medications: Patient's Medications  New Prescriptions   No medications on file  Previous Medications   ACETAMINOPHEN (TYLENOL) 325 MG TABLET    Take 650 mg by mouth every 6 (six) hours as needed for fever (greater than 101.0).   ACETAMINOPHEN (TYLENOL) 650 MG CR TABLET    Take 650 mg by mouth every 8 (eight) hours as needed for pain.   ALPRAZOLAM (XANAX) 0.25 MG TABLET    Take one tablet by mouth every 8 hours as needed for anixety   ASPIRIN 81 MG CHEWABLE TABLET    Chew 81 mg by mouth daily. For CVA   CHOLECALCIFEROL 2000 UNITS TABS    Take 2,000 tablets by mouth daily. For calcium supplement   LISINOPRIL (PRINIVIL,ZESTRIL) 10 MG TABLET    Take 10 mg by mouth daily. For HTN   MAGNESIUM HYDROXIDE (MILK OF MAGNESIA) 800 MG/5ML SUSPENSION    Take 30 mLs by mouth daily as needed for constipation.   SODIUM PHOSPHATES (RA SALINE ENEMA RE)    Place  rectally as needed.  Modified Medications   No medications on file  Discontinued Medications   No medications on file     Physical Exam: Filed Vitals:   11/16/14 2010  BP: 120/72  Pulse: 82  Temp: 98 F (36.7 C)  Resp: 20  Weight: 95 lb (43.092 kg)    Physical Exam  Constitutional: No distress.  Thin. Frail.  HENT:  Mouth/Throat: Oropharynx is clear and moist. No oropharyngeal exudate.  Neck: Neck supple.  Cardiovascular: Normal rate, regular rhythm and normal heart sounds.   Pulmonary/Chest: Effort normal and breath sounds normal.  Abdominal: Soft. Bowel sounds are normal.  Musculoskeletal: She exhibits no edema or tenderness.  Right hemiparesis. Contractures noted at wrist bilaterally  Neurological: She is alert.   Advanced dementia.     Skin: Skin is warm and dry.  Psychiatric: Her behavior is normal.    Labs reviewed: Basic Metabolic Panel: No results for input(s): NA, K, CL, CO2, GLUCOSE, BUN, CREATININE, CALCIUM, MG, PHOS in the last 8760 hours. Liver Function Tests: No results for input(s): AST, ALT, ALKPHOS, BILITOT, PROT, ALBUMIN in the last 8760 hours. No results for input(s): LIPASE, AMYLASE in the last 8760 hours. No results for input(s): AMMONIA in the last 8760 hours. CBC: No results for input(s): WBC, NEUTROABS, HGB, HCT, MCV, PLT in the last 8760 hours. TSH: No results for input(s): TSH in the last 8760 hours. A1C: Lab Results  Component Value Date   HGBA1C 6.5* 07/13/2012   Lipid Panel: No results for input(s): CHOL, HDL, LDLCALC, TRIG, CHOLHDL, LDLDIRECT in the last 8760 hours.   WBC 5.3     4.0-10.5 K/uL SLN   RBC 4.11     3.87-5.11 MIL/uL SLN   Hemoglobin 11.7   L 12.0-15.0 g/dL SLN   Hematocrit 35.9   L 36.0-46.0 % SLN   MCV 87.3     78.0-100.0 fL SLN   MCH 28.5     26.0-34.0 pg SLN   MCHC 32.6     30.0-36.0 g/dL SLN   RDW 13.4     11.5-15.5 % SLN   Platelet Count 174     150-400 K/uL SLN   Granulocyte % 51     43-77 % SLN   Absolute Gran 2.7     1.7-7.7 K/uL SLN   Lymph % 34     12-46 % SLN   Absolute Lymph 1.8     0.7-4.0 K/uL SLN   Mono % 12     3-12 % SLN   Absolute Mono 0.6     0.1-1.0 K/uL SLN   Eos % 3     0-5 % SLN   Absolute Eos 0.2     0.0-0.7 K/uL SLN   Baso % 0     0-1 % SLN   Absolute Baso 0.0     0.0-0.1 K/uL SLN   Smear Review Criteria for review not met  SLN   Lipid Panel    Result: 01/30/2014 4:54 PM   ( Status: F )       Cholesterol 173     0-200 mg/dL SLN C Triglyceride 48     <150 mg/dL SLN   HDL Cholesterol 46     >39 mg/dL SLN   Total Chol/HDL Ratio 3.8      Ratio SLN   VLDL Cholesterol (Calc) 10     0-40 mg/dL SLN   LDL Cholesterol (Calc) 117   H 0-99 mg/dL SLN C Hepatic Function Panel  Result: 01/30/2014 4:54 PM   ( Status: F )       Bilirubin,  Total 0.4     0.2-1.2 mg/dL SLN   Bilirubin, Direct 0.1     0.0-0.3 mg/dL SLN   Indirect Bilirubin 0.3     0.2-1.2 mg/dL SLN   Alkaline Phosphatase 83     39-117 U/L SLN   AST/SGOT 12     0-37 U/L SLN   ALT/SGPT <8     0-35 U/L SLN   Total Protein 6.1     6.0-8.3 g/dL SLN   Albumin 3.4   L 3.5-5.2 g/dL SLN  Basic Metabolic Panel    Result: 02/08/2014 4:56 PM   ( Status: F )       Sodium 140     135-145 mEq/L SLN   Potassium 3.8     3.5-5.3 mEq/L SLN   Chloride 106     96-112 mEq/L SLN   CO2 26     19-32 mEq/L SLN   Glucose 95     70-99 mg/dL SLN   BUN 12     6-23 mg/dL SLN   Creatinine 0.73     0.50-1.10 mg/dL SLN   Calcium 8.6     8.4-10.5 mg/dL SLN    Test Description Results   Abnormal Reference Range Units Lab Note CBC with Diff    Result: 06/22/2014 9:49 PM   ( Status: F )     C WBC 4.9     4.0-10.5 K/uL SLN   RBC 4.10     3.87-5.11 MIL/uL SLN   Hemoglobin 11.7   L 12.0-15.0 g/dL SLN   Hematocrit 34.9   L 36.0-46.0 % SLN   MCV 85.1     78.0-100.0 fL SLN   MCH 28.5     26.0-34.0 pg SLN   MCHC 33.5     30.0-36.0 g/dL SLN   RDW 13.8     11.5-15.5 % SLN   Platelet Count 157     150-400 K/uL SLN   MPV 10.6     8.6-12.4 fL SLN   Granulocyte % 65     43-77 % SLN   Absolute Gran 3.2     1.7-7.7 K/uL SLN   Lymph % 27     12-46 % SLN   Absolute Lymph 1.3     0.7-4.0 K/uL SLN   Mono % 7     3-12 % SLN   Absolute Mono 0.3     0.1-1.0 K/uL SLN   Eos % 1     0-5 % SLN   Absolute Eos 0.0     0.0-0.7 K/uL SLN    Baso % 0     0-1 % SLN   Absolute Baso 0.0     0.0-0.1 K/uL SLN   Smear Review Criteria for review not met  SLN   Basic Metabolic Panel    Result: 06/23/2014 1:43 AM   ( Status: F )       Sodium 141     135-145 mEq/L SLN   Potassium 3.7     3.5-5.3 mEq/L SLN   Chloride 106     96-112 mEq/L SLN   CO2 27     19-32 mEq/L SLN   Glucose 100   H 70-99 mg/dL SLN   BUN 13     6-23 mg/dL SLN   Creatinine 0.65     0.50-1.10 mg/dL SLN   Calcium 9.2      8.4-10.5 mg/dL SLN  Assessment/Plan  1. Essential hypertension, benign Blood pressure remains stable, conts on lisinopril daily   2. CVA, old, hemiparesis Stable, cont on ASA 81 mg daily  3. Vascular dementia without behavioral disturbance -advanced, nonverbal. Without acute decline in the last months. conts to require 24 hour care from staff.   4. FTT (failure to thrive) in adult Weight remains stable, conts with support from staff, anticipate further decline with progression of dementia.

## 2014-12-23 ENCOUNTER — Non-Acute Institutional Stay (SKILLED_NURSING_FACILITY): Payer: Medicare Other | Admitting: Nurse Practitioner

## 2014-12-23 DIAGNOSIS — R627 Adult failure to thrive: Secondary | ICD-10-CM

## 2014-12-23 DIAGNOSIS — F0150 Vascular dementia without behavioral disturbance: Secondary | ICD-10-CM

## 2014-12-23 DIAGNOSIS — D638 Anemia in other chronic diseases classified elsewhere: Secondary | ICD-10-CM

## 2014-12-23 DIAGNOSIS — I10 Essential (primary) hypertension: Secondary | ICD-10-CM

## 2014-12-23 DIAGNOSIS — I69359 Hemiplegia and hemiparesis following cerebral infarction affecting unspecified side: Secondary | ICD-10-CM

## 2014-12-23 NOTE — Progress Notes (Signed)
Patient ID: Tracie [name], female   DOB: 1929/02/04, 81 y.o.   MRN: [id number]    Nursing Home Location:  [place] of Service: SNF (31)  PCP: Estill Dooms, MD  No Known Allergies  Chief Complaint  Patient presents with  . Medical Management of Chronic Issues    HPI:  Patient is a 81 y.o. female seen today at [facility] for routine follow up on chronic conditions. Pt with  pmh of advanced dementia, CVA with RT sided hemiparesis, htn,weight loss. Pt unable to participate in ROS or HPI due to advanced dementia. Pt requiring total care by staff. Pt conts to eat well with assistance of staff however holding food at time. Pt has lost weight in the last month. Nursing reports increased pain to legs with movement and transfers due to contractures. Pt moving bowels well. No changes in urination. Wound on sacrum remains stable   Review of Systems:  Review of Systems  Unable to perform ROS: Dementia    Past Medical History  Diagnosis Date  . Stroke   . Dementia   . Hypertension   . Laceration of skin of eyelid and periocular area   . Other fall   . Wheelchair dependence   . Hypopotassemia   . Encounters for unspecified administrative purpose   . Other and unspecified hyperlipidemia   . Reflux esophagitis   . Blood in stool   . Routine general medical examination at a health care facility   . Hypopotassemia   . Dementia in conditions classified elsewhere with behavioral disturbance   . Dementia in conditions classified elsewhere without behavioral disturbance   . Loss of weight   . Cough   . Unspecified vitamin D deficiency   . Thrombocytopenia, unspecified   . Unspecified disorder of kidney and ureter   . Other and unspecified hyperlipidemia   . Senile dementia, uncomplicated   . Anxiety state, unspecified   . Unspecified essential hypertension   . Unspecified late effects of cerebrovascular disease   . Other malaise and fatigue     . Abnormality of gait   . Cerebral contusion 05/04/2012  . Tremors of nervous system 11/11/2012   Past Surgical History  Procedure Laterality Date  . Joint replacement  2008    Dr Eddie Dibbles  . Abdominal hysterectomy  1981   Social History:   reports that she has never smoked. She has never used smokeless tobacco. She reports that she does not drink alcohol or use illicit drugs.  No family history on file.  Medications: Patient's Medications  New Prescriptions   No medications on file  Previous Medications   ACETAMINOPHEN (TYLENOL) 325 MG TABLET    Take 650 mg by mouth every 6 (six) hours as needed for fever (greater than 101.0).   ACETAMINOPHEN (TYLENOL) 650 MG CR TABLET    Take 650 mg by mouth every 8 (eight) hours as needed for pain.   ALPRAZOLAM (XANAX) 0.25 MG TABLET    Take one tablet by mouth every 8 hours as needed for anixety   ASPIRIN 81 MG CHEWABLE TABLET    Chew 81 mg by mouth daily. For CVA   CHOLECALCIFEROL 2000 UNITS TABS    Take 2,000 tablets by mouth daily. For calcium supplement   LISINOPRIL (PRINIVIL,ZESTRIL) 10 MG TABLET    Take 10 mg by mouth daily. For HTN   MAGNESIUM HYDROXIDE (MILK OF MAGNESIA) 800 MG/5ML SUSPENSION    Take 30 mLs  by mouth daily as needed for constipation.   SODIUM PHOSPHATES (RA SALINE ENEMA RE)    Place rectally as needed.  Modified Medications   No medications on file  Discontinued Medications   No medications on file     Physical Exam: Filed Vitals:   12/23/14 1605  BP: 126/59  Pulse: 68  Temp: 98 F (36.7 C)  Resp: 20  Weight: 91 lb (41.277 kg)    Physical Exam  Constitutional: No distress.  Thin. Frail.  HENT:  Mouth/Throat: Oropharynx is clear and moist. No oropharyngeal exudate.  Neck: Neck supple.  Cardiovascular: Normal rate, regular rhythm and normal heart sounds.   Pulmonary/Chest: Effort normal and breath sounds normal.  Abdominal: Soft. Bowel sounds are normal.  Musculoskeletal: She exhibits tenderness (with  movement to LE). She exhibits no edema.  Right hemiparesis. Contractures noted at wrist bilaterally  Neurological: She is alert.   Advanced dementia.   Skin: Skin is warm and dry.  Psychiatric: Her behavior is normal.    Labs reviewed: Basic Metabolic Panel: No results for input(s): NA, K, CL, CO2, GLUCOSE, BUN, CREATININE, CALCIUM, MG, PHOS in the last 8760 hours. Liver Function Tests: No results for input(s): AST, ALT, ALKPHOS, BILITOT, PROT, ALBUMIN in the last 8760 hours. No results for input(s): LIPASE, AMYLASE in the last 8760 hours. No results for input(s): AMMONIA in the last 8760 hours. CBC: No results for input(s): WBC, NEUTROABS, HGB, HCT, MCV, PLT in the last 8760 hours. TSH: No results for input(s): TSH in the last 8760 hours. A1C: Lab Results  Component Value Date   HGBA1C 6.5* 07/13/2012   Lipid Panel: No results for input(s): CHOL, HDL, LDLCALC, TRIG, CHOLHDL, LDLDIRECT in the last 8760 hours.   WBC 5.3     4.0-10.5 K/uL SLN   RBC 4.11     3.87-5.11 MIL/uL SLN   Hemoglobin 11.7   L 12.0-15.0 g/dL SLN   Hematocrit 35.9   L 36.0-46.0 % SLN   MCV 87.3     78.0-100.0 fL SLN   MCH 28.5     26.0-34.0 pg SLN   MCHC 32.6     30.0-36.0 g/dL SLN   RDW 13.4     11.5-15.5 % SLN   Platelet Count 174     150-400 K/uL SLN   Granulocyte % 51     43-77 % SLN   Absolute Gran 2.7     1.7-7.7 K/uL SLN   Lymph % 34     12-46 % SLN   Absolute Lymph 1.8     0.7-4.0 K/uL SLN   Mono % 12     3-12 % SLN   Absolute Mono 0.6     0.1-1.0 K/uL SLN   Eos % 3     0-5 % SLN   Absolute Eos 0.2     0.0-0.7 K/uL SLN   Baso % 0     0-1 % SLN   Absolute Baso 0.0     0.0-0.1 K/uL SLN   Smear Review Criteria for review not met  SLN   Lipid Panel    Result: 01/30/2014 4:54 PM   ( Status: F )       Cholesterol 173     0-200 mg/dL SLN C Triglyceride 48     <150 mg/dL SLN   HDL Cholesterol 46     >39 mg/dL SLN   Total Chol/HDL Ratio 3.8      Ratio SLN   VLDL Cholesterol (Calc) 10  0-40 mg/dL SLN   LDL Cholesterol (Calc) 117   H 0-99 mg/dL SLN C Hepatic Function Panel    Result: 01/30/2014 4:54 PM   ( Status: F )       Bilirubin, Total 0.4     0.2-1.2 mg/dL SLN   Bilirubin, Direct 0.1     0.0-0.3 mg/dL SLN   Indirect Bilirubin 0.3     0.2-1.2 mg/dL SLN   Alkaline Phosphatase 83     39-117 U/L SLN   AST/SGOT 12     0-37 U/L SLN   ALT/SGPT <8     0-35 U/L SLN   Total Protein 6.1     6.0-8.3 g/dL SLN   Albumin 3.4   L 3.5-5.2 g/dL SLN  Basic Metabolic Panel    Result: 02/08/2014 4:56 PM   ( Status: F )       Sodium 140     135-145 mEq/L SLN   Potassium 3.8     3.5-5.3 mEq/L SLN   Chloride 106     96-112 mEq/L SLN   CO2 26     19-32 mEq/L SLN   Glucose 95     70-99 mg/dL SLN   BUN 12     6-23 mg/dL SLN   Creatinine 0.73     0.50-1.10 mg/dL SLN   Calcium 8.6     8.4-10.5 mg/dL SLN    Test Description Results   Abnormal Reference Range Units Lab Note CBC with Diff    Result: 06/22/2014 9:49 PM   ( Status: F )     C WBC 4.9     4.0-10.5 K/uL SLN   RBC 4.10     3.87-5.11 MIL/uL SLN   Hemoglobin 11.7   L 12.0-15.0 g/dL SLN   Hematocrit 34.9   L 36.0-46.0 % SLN   MCV 85.1     78.0-100.0 fL SLN   MCH 28.5     26.0-34.0 pg SLN   MCHC 33.5     30.0-36.0 g/dL SLN   RDW 13.8     11.5-15.5 % SLN   Platelet Count 157     150-400 K/uL SLN   MPV 10.6     8.6-12.4 fL SLN   Granulocyte % 65     43-77 % SLN   Absolute Gran 3.2     1.7-7.7 K/uL SLN   Lymph % 27     12-46 % SLN   Absolute Lymph 1.3     0.7-4.0 K/uL SLN   Mono % 7     3-12 % SLN   Absolute Mono 0.3     0.1-1.0 K/uL SLN   Eos % 1     0-5 % SLN   Absolute Eos 0.0     0.0-0.7 K/uL SLN    Baso % 0     0-1 % SLN   Absolute Baso 0.0     0.0-0.1 K/uL SLN   Smear Review Criteria for review not met  SLN   Basic Metabolic Panel    Result: 06/23/2014 1:43 AM   ( Status: F )       Sodium 141     135-145 mEq/L SLN   Potassium 3.7     3.5-5.3 mEq/L SLN   Chloride 106     96-112 mEq/L SLN   CO2 27      19-32 mEq/L SLN   Glucose 100   H 70-99 mg/dL SLN   BUN 13     6-23 mg/dL SLN  Creatinine 0.65     0.50-1.10 mg/dL SLN   Calcium 9.2     8.4-10.5 mg/dL SLN  Assessment/Plan  1. CVA, old, hemiparesis -with tightness to LE which causes pain on movement, tylenol as been effective in the past, will schedule BID at this time  2. Essential hypertension, benign -blood pressure remains stable on lisinopril, will follow up CMP  3. Vascular dementia without behavioral disturbance Advanced dementia, holding food in mouth at times. Expect further decline has disease progresses  4. FTT (failure to thrive) in adult With ongoing weight loss despite intervention by staff. Support from nursing and RD continues. Will monitor. Will get CMP at this time  5. Anemia in other chronic diseases classified elsewhere -will follow up hgb.    Carlos American. Harle Battiest  [facility] [phone] 8 am - 5 pm) [phone] (after hours)

## 2014-12-26 LAB — BASIC METABOLIC PANEL
BUN: 22 mg/dL — AB (ref 4–21)
Creatinine: 0.8 mg/dL (ref <=1.1)
Glucose: 78 mg/dL
POTASSIUM: 4 mmol/L (ref 3.4–5.3)
SODIUM: 139 mmol/L (ref 137–147)

## 2014-12-26 LAB — HEPATIC FUNCTION PANEL
ALT: 12 U/L (ref 7–35)
AST: 16 U/L (ref 13–35)
Alkaline Phosphatase: 52 U/L (ref 25–125)
Bilirubin, Total: 0.6 mg/dL

## 2014-12-26 LAB — CBC AND DIFFERENTIAL
HCT: 36 % (ref 36–46)
Hemoglobin: 12 g/dL (ref 12.0–16.0)
Platelets: 167 10*3/uL (ref 150–399)
WBC: 4.9 10^3/mL

## 2015-01-27 ENCOUNTER — Non-Acute Institutional Stay (SKILLED_NURSING_FACILITY): Payer: Medicare Other | Admitting: Nurse Practitioner

## 2015-01-27 ENCOUNTER — Encounter: Payer: Self-pay | Admitting: Nurse Practitioner

## 2015-01-27 DIAGNOSIS — F0150 Vascular dementia without behavioral disturbance: Secondary | ICD-10-CM | POA: Diagnosis not present

## 2015-01-27 DIAGNOSIS — D638 Anemia in other chronic diseases classified elsewhere: Secondary | ICD-10-CM | POA: Diagnosis not present

## 2015-01-27 DIAGNOSIS — I69359 Hemiplegia and hemiparesis following cerebral infarction affecting unspecified side: Secondary | ICD-10-CM | POA: Diagnosis not present

## 2015-01-27 DIAGNOSIS — L89151 Pressure ulcer of sacral region, stage 1: Secondary | ICD-10-CM | POA: Diagnosis not present

## 2015-01-27 DIAGNOSIS — I10 Essential (primary) hypertension: Secondary | ICD-10-CM

## 2015-01-27 NOTE — Progress Notes (Signed)
Nursing Home Location:  [facility]   Place of Service: SNF (31)  PCP: GREEN, Lenon CurtARTHUR G, MD  No Known Allergies  Chief Complaint  Patient presents with  . Medical Management of Chronic Issues    HPI:  Patient is a 81 y.o. female seen today at [facility] for routine follow up.  Pt with pmh of advanced dementia, CVA with RT sided hemiparesis, htn,weight loss. Pt unable to participate in ROS or HPI due to advanced dementia. Since last visit nursing reports sacral ulcer has reopened. No signs of infection. Ulcer has been recurrent and followed closely by treatment nurse.  Weight has been stable in the last month. Eating with assistance of staff. Pain controlled on scheduled tylenol.   Review of Systems:  Review of Systems  Unable to perform ROS: Dementia    Past Medical History  Diagnosis Date  . Stroke (HCC)   . Dementia   . Hypertension   . Laceration of skin of eyelid and periocular area   . Other fall   . Wheelchair dependence   . Hypopotassemia   . Encounters for unspecified administrative purpose   . Other and unspecified hyperlipidemia   . Reflux esophagitis   . Blood in stool   . Routine general medical examination at a health care facility   . Hypopotassemia   . Dementia in conditions classified elsewhere with behavioral disturbance   . Dementia in conditions classified elsewhere without behavioral disturbance   . Loss of weight   . Cough   . Unspecified vitamin D deficiency   . Thrombocytopenia, unspecified (HCC)   . Unspecified disorder of kidney and ureter   . Other and unspecified hyperlipidemia   . Senile dementia, uncomplicated   . Anxiety state, unspecified   . Unspecified essential hypertension   . Unspecified late effects of cerebrovascular disease   . Other malaise and fatigue   . Abnormality of gait   . Cerebral contusion (HCC) 05/04/2012  . Tremors of nervous system 11/11/2012   Past Surgical History    Procedure Laterality Date  . Joint replacement  2008    Dr Renae FicklePaul  . Abdominal hysterectomy  1981   Social History:   reports that she has never smoked. She has never used smokeless tobacco. She reports that she does not drink alcohol or use illicit drugs.  No family history on file.  Medications: Patient's Medications  New Prescriptions   No medications on file  Previous Medications   ACETAMINOPHEN (TYLENOL) 325 MG TABLET    Take 650 mg by mouth every 6 (six) hours as needed for fever (greater than 101.0).   ACETAMINOPHEN (TYLENOL) 650 MG CR TABLET    Take 650 mg by mouth every 8 (eight) hours as needed for pain.   ALPRAZOLAM (XANAX) 0.25 MG TABLET    Take one tablet by mouth every 8 hours as needed for anixety   ASPIRIN 81 MG CHEWABLE TABLET    Chew 81 mg by mouth daily. For CVA   CHLORHEXIDINE (PERIDEX) 0.12 % SOLUTION    Use as directed 15 mLs in the mouth or throat 4 (four) times daily.   CHOLECALCIFEROL 2000 UNITS TABS    Take 2,000 tablets by mouth daily. For calcium supplement   LISINOPRIL (PRINIVIL,ZESTRIL) 10 MG TABLET    Take 10 mg by mouth daily. For HTN   MAGNESIUM HYDROXIDE (MILK OF MAGNESIA) 800 MG/5ML SUSPENSION    Take 30 mLs by mouth daily as needed  for constipation.   MULTIPLE VITAMIN (MULTIVITAMIN) TABLET    Take 1 tablet by mouth daily.   SODIUM PHOSPHATES (RA SALINE ENEMA RE)    Place rectally as needed.  Modified Medications   No medications on file  Discontinued Medications   No medications on file     Physical Exam: Filed Vitals:   01/27/15 1613  BP: 126/59  Pulse: 70  Temp: 98.1 F (36.7 C)  TempSrc: Oral  Resp: 20  Weight: 91 lb 9.6 oz (41.549 kg)    Physical Exam  Constitutional: No distress.  Thin. Frail.  HENT:  Mouth/Throat: Oropharynx is clear and moist. No oropharyngeal exudate.  Neck: Neck supple.  Cardiovascular: Normal rate, regular rhythm and normal heart sounds.   Pulmonary/Chest: Effort normal and breath sounds normal.   Abdominal: Soft. Bowel sounds are normal.  Musculoskeletal: She exhibits no edema or tenderness.  Right hemiparesis. Contractures noted at wrist bilaterally  Neurological: She is alert.   Advanced dementia.   Skin: Skin is warm and dry.  Psychiatric: Her behavior is normal.    Labs reviewed: Basic Metabolic Panel:  Recent Labs  19/14/78  NA 139  K 4.0  BUN 22*  CREATININE 0.8   Liver Function Tests:  Recent Labs  12/26/14  AST 16  ALT 12  ALKPHOS 52   No results for input(s): LIPASE, AMYLASE in the last 8760 hours. No results for input(s): AMMONIA in the last 8760 hours. CBC:  Recent Labs  12/26/14  WBC 4.9  HGB 12.0  HCT 36  PLT 167   TSH: No results for input(s): TSH in the last 8760 hours. A1C: Lab Results  Component Value Date   HGBA1C 6.5* 07/13/2012   Lipid Panel: No results for input(s): CHOL, HDL, LDLCALC, TRIG, CHOLHDL, LDLDIRECT in the last 8760 hours.   Assessment/Plan 1. Essential hypertension, benign Blood pressure stable, labs reviewed and stable. To cont lisinopril   2. CVA, old, hemiparesis (HCC) Stable, scheduled tylenol effective for pain. conts current medication.   3. Vascular dementia without behavioral disturbance Advanced dementia, no acute cognitive or functional decline in the last month.   4. Anemia in other chronic diseases classified elsewhere hgb stable at this time  5. Decubitus ulcer of sacral region, stage 1 -recurrent area on sacrum has now re-opened, following with treatment nurse   Janene Harvey. Biagio Borg  [facility] [phone] 8 am - 5 pm) [phone] (after hours)

## 2015-03-03 ENCOUNTER — Non-Acute Institutional Stay (SKILLED_NURSING_FACILITY): Payer: Medicare Other | Admitting: Nurse Practitioner

## 2015-03-03 DIAGNOSIS — I69359 Hemiplegia and hemiparesis following cerebral infarction affecting unspecified side: Secondary | ICD-10-CM

## 2015-03-03 DIAGNOSIS — D638 Anemia in other chronic diseases classified elsewhere: Secondary | ICD-10-CM | POA: Diagnosis not present

## 2015-03-03 DIAGNOSIS — I10 Essential (primary) hypertension: Secondary | ICD-10-CM

## 2015-03-03 DIAGNOSIS — F0150 Vascular dementia without behavioral disturbance: Secondary | ICD-10-CM | POA: Diagnosis not present

## 2015-03-03 DIAGNOSIS — R627 Adult failure to thrive: Secondary | ICD-10-CM | POA: Diagnosis not present

## 2015-03-03 DIAGNOSIS — L89151 Pressure ulcer of sacral region, stage 1: Secondary | ICD-10-CM

## 2015-03-03 NOTE — Progress Notes (Signed)
Nursing Home Location:  [facility]   Place of Service: SNF (31)  PCP: GREEN, Lenon Curt, MD  No Known Allergies  Chief Complaint  Patient presents with  . Medical Management of Chronic Issues    HPI:  Patient is a 81 y.o. female seen today at [facility] for routine follow up.  Pt with pmh of advanced dementia, CVA with RT sided hemiparesis, htn,weight loss. Pt unable to participate in ROS or HPI due to advanced dementia. Pt conts to eat well with assistance of staff. Requires total care provided by staff for bathing, eating, changing, etc. Pt with good bowel movements and no changes in urination. No signs of increased pain or worsening contractures.    Review of Systems:  Review of Systems  Unable to perform ROS: Dementia    Past Medical History  Diagnosis Date  . Stroke (HCC)   . Dementia   . Hypertension   . Laceration of skin of eyelid and periocular area   . Other fall   . Wheelchair dependence   . Hypopotassemia   . Encounters for unspecified administrative purpose   . Other and unspecified hyperlipidemia   . Reflux esophagitis   . Blood in stool   . Routine general medical examination at a health care facility   . Hypopotassemia   . Dementia in conditions classified elsewhere with behavioral disturbance   . Dementia in conditions classified elsewhere without behavioral disturbance   . Loss of weight   . Cough   . Unspecified vitamin D deficiency   . Thrombocytopenia, unspecified (HCC)   . Unspecified disorder of kidney and ureter   . Other and unspecified hyperlipidemia   . Senile dementia, uncomplicated   . Anxiety state, unspecified   . Unspecified essential hypertension   . Unspecified late effects of cerebrovascular disease   . Other malaise and fatigue   . Abnormality of gait   . Cerebral contusion (HCC) 05/04/2012  . Tremors of nervous system 11/11/2012   Past Surgical History  Procedure Laterality Date  . Joint  replacement  2008    Dr Renae Fickle  . Abdominal hysterectomy  1981   Social History:   reports that she has never smoked. She has never used smokeless tobacco. She reports that she does not drink alcohol or use illicit drugs.  No family history on file.  Medications: Patient's Medications  New Prescriptions   No medications on file  Previous Medications   ACETAMINOPHEN (TYLENOL) 325 MG TABLET    Take 650 mg by mouth every 6 (six) hours as needed for fever (greater than 101.0).   ACETAMINOPHEN (TYLENOL) 650 MG CR TABLET    Take 650 mg by mouth every 8 (eight) hours as needed for pain.   ALPRAZOLAM (XANAX) 0.25 MG TABLET    Take one tablet by mouth every 8 hours as needed for anixety   ASPIRIN 81 MG CHEWABLE TABLET    Chew 81 mg by mouth daily. For CVA   CHLORHEXIDINE (PERIDEX) 0.12 % SOLUTION    Use as directed 15 mLs in the mouth or throat 4 (four) times daily.   CHOLECALCIFEROL 2000 UNITS TABS    Take 2,000 tablets by mouth daily. For calcium supplement   LISINOPRIL (PRINIVIL,ZESTRIL) 10 MG TABLET    Take 10 mg by mouth daily. For HTN   MAGNESIUM HYDROXIDE (MILK OF MAGNESIA) 800 MG/5ML SUSPENSION    Take 30 mLs by mouth daily as needed for constipation.  MULTIPLE VITAMIN (MULTIVITAMIN) TABLET    Take 1 tablet by mouth daily.   SODIUM PHOSPHATES (RA SALINE ENEMA RE)    Place rectally as needed.  Modified Medications   No medications on file  Discontinued Medications   No medications on file     Physical Exam: Filed Vitals:   03/03/15 1659  BP: 108/66  Pulse: 60  Temp: 97.5 F (36.4 C)  Resp: 20  Weight: 91 lb 9.6 oz (41.549 kg)    Physical Exam  Constitutional: No distress.  Thin. Frail.  HENT:  Mouth/Throat: Oropharynx is clear and moist. No oropharyngeal exudate.  Neck: Neck supple.  Cardiovascular: Normal rate, regular rhythm and normal heart sounds.   Pulmonary/Chest: Effort normal and breath sounds normal.  Abdominal: Soft. Bowel sounds are normal.  Musculoskeletal:  She exhibits no edema or tenderness.  Right hemiparesis. Contractures noted at wrist bilaterally  Neurological: She is alert.   Advanced dementia.   Skin: Skin is warm and dry.  Psychiatric: Her behavior is normal.    Labs reviewed: Basic Metabolic Panel:  Recent Labs  16/12/9608/03/16  NA 139  K 4.0  BUN 22*  CREATININE 0.8   Liver Function Tests:  Recent Labs  12/26/14  AST 16  ALT 12  ALKPHOS 52   No results for input(s): LIPASE, AMYLASE in the last 8760 hours. No results for input(s): AMMONIA in the last 8760 hours. CBC:  Recent Labs  12/26/14  WBC 4.9  HGB 12.0  HCT 36  PLT 167   TSH: No results for input(s): TSH in the last 8760 hours. A1C: Lab Results  Component Value Date   HGBA1C 6.5* 07/13/2012   Lipid Panel: No results for input(s): CHOL, HDL, LDLCALC, TRIG, CHOLHDL, LDLDIRECT in the last 8760 hours.   Assessment/Plan 1. Decubitus ulcer of sacral region, stage 2  -following by treatment nurse, remains stable -cont MVI and nutritional supplements as well as pressure reduction   2. FTT (failure to thrive) in adult Weight has been stable in the last month, however due to advanced dementia expect to worsen with disease progression  3. Anemia in other chronic diseases classified elsewhere hgb stable on recent labs in October  4. Vascular dementia without behavioral disturbance Advanced dementia. No acute worsening of disease  5. CVA, old, hemiparesis (HCC) -stable at this time, cons on ASA daily  6. Essential hypertension, benign -blood pressure controlled, maintained on lisinopril 10 mg daily      Yashvi Jasinski K. Biagio BorgEubanks, AGNP  [facility] [phone](Monday-Friday 8 am - 5 pm) [phone] (after hours)

## 2015-03-09 NOTE — Addendum Note (Signed)
Addended by: Sharon SellerEUBANKS, Adryel Wortmann K on: 03/09/2015 11:01 AM   Modules accepted: Level of Service

## 2015-04-07 ENCOUNTER — Encounter: Payer: Self-pay | Admitting: Nurse Practitioner

## 2015-04-07 ENCOUNTER — Non-Acute Institutional Stay (SKILLED_NURSING_FACILITY): Payer: Medicare Other | Admitting: Nurse Practitioner

## 2015-04-07 DIAGNOSIS — F0150 Vascular dementia without behavioral disturbance: Secondary | ICD-10-CM

## 2015-04-07 DIAGNOSIS — I69359 Hemiplegia and hemiparesis following cerebral infarction affecting unspecified side: Secondary | ICD-10-CM

## 2015-04-07 DIAGNOSIS — I10 Essential (primary) hypertension: Secondary | ICD-10-CM

## 2015-04-07 DIAGNOSIS — R627 Adult failure to thrive: Secondary | ICD-10-CM | POA: Diagnosis not present

## 2015-04-07 NOTE — Progress Notes (Signed)
Nursing Home Location:  [facility]   Place of Service: SNF (31)  PCP: GREEN, Lenon Curt, MD  No Known Allergies  Chief Complaint  Patient presents with  . Medical Management of Chronic Issues    HPI:  Patient is a 82 y.o. female seen today at [facility] for routine follow up.  Pt with pmh of advanced dementia, CVA with RT sided hemiparesis, htn,weight loss. Pt unable to participate in ROS or HPI due to advanced dementia. Pt conts to eat well with assistance of staff and has had ongoing weight loss with decrease appetite. remeron was started and weight gain has been noted. Pt conts to requires total care provided by staff for bathing, eating, changing, etc. There is no signs of pain, depression or anxiety noted. Pt smiles frequently. Staff has no concerns at this time.    Review of Systems:  Review of Systems  Unable to perform ROS: Dementia    Past Medical History  Diagnosis Date  . Stroke (HCC)   . Dementia   . Hypertension   . Laceration of skin of eyelid and periocular area   . Other fall   . Wheelchair dependence   . Hypopotassemia   . Encounters for unspecified administrative purpose   . Other and unspecified hyperlipidemia   . Reflux esophagitis   . Blood in stool   . Routine general medical examination at a health care facility   . Hypopotassemia   . Dementia in conditions classified elsewhere with behavioral disturbance   . Dementia in conditions classified elsewhere without behavioral disturbance   . Loss of weight   . Cough   . Unspecified vitamin D deficiency   . Thrombocytopenia, unspecified (HCC)   . Unspecified disorder of kidney and ureter   . Other and unspecified hyperlipidemia   . Senile dementia, uncomplicated   . Anxiety state, unspecified   . Unspecified essential hypertension   . Unspecified late effects of cerebrovascular disease   . Other malaise and fatigue   . Abnormality of gait   . Cerebral contusion  (HCC) 05/04/2012  . Tremors of nervous system 11/11/2012   Past Surgical History  Procedure Laterality Date  . Joint replacement  2008    Dr Renae Fickle  . Abdominal hysterectomy  1981   Social History:   reports that she has never smoked. She has never used smokeless tobacco. She reports that she does not drink alcohol or use illicit drugs.  No family history on file.  Medications: Patient's Medications  New Prescriptions   No medications on file  Previous Medications   ACETAMINOPHEN (TYLENOL) 325 MG TABLET    Take 650 mg by mouth every 6 (six) hours as needed for fever (greater than 101.0).   ACETAMINOPHEN (TYLENOL) 650 MG CR TABLET    Take 650 mg by mouth every 8 (eight) hours as needed for pain.   ALPRAZOLAM (XANAX) 0.25 MG TABLET    Take one tablet by mouth every 8 hours as needed for anixety   AMINO ACIDS-PROTEIN HYDROLYS (FEEDING SUPPLEMENT, PRO-STAT SUGAR FREE 64,) LIQD    Take 30 mLs by mouth 2 (two) times daily with a meal.   ASPIRIN 81 MG CHEWABLE TABLET    Chew 81 mg by mouth daily. For CVA   CHLORHEXIDINE (PERIDEX) 0.12 % SOLUTION    Use as directed 15 mLs in the mouth or throat 4 (four) times daily.   CHOLECALCIFEROL 2000 UNITS TABS    Take 2,000  tablets by mouth daily. For calcium supplement   LISINOPRIL (PRINIVIL,ZESTRIL) 10 MG TABLET    Take 10 mg by mouth daily. For HTN   MAGNESIUM HYDROXIDE (MILK OF MAGNESIA) 800 MG/5ML SUSPENSION    Take 30 mLs by mouth daily as needed for constipation.   MIRTAZAPINE (REMERON) 15 MG TABLET    Take 15 mg by mouth at bedtime.   MULTIPLE VITAMIN (MULTIVITAMIN) TABLET    Take 1 tablet by mouth daily.   SODIUM PHOSPHATES (RA SALINE ENEMA RE)    Place rectally as needed.  Modified Medications   No medications on file  Discontinued Medications   No medications on file     Physical Exam: Filed Vitals:   04/07/15 1209  BP: 108/66  Pulse: 60  Temp: 97.5 F (36.4 C)  TempSrc: Oral  Resp: 20  Weight: 90 lb 9.6 oz (41.096 kg)     Physical Exam  Constitutional: No distress.  Thin. Frail.  HENT:  Mouth/Throat: Oropharynx is clear and moist. No oropharyngeal exudate.  Neck: Neck supple.  Cardiovascular: Normal rate, regular rhythm and normal heart sounds.   Pulmonary/Chest: Effort normal and breath sounds normal.  Abdominal: Soft. Bowel sounds are normal.  Musculoskeletal: She exhibits no edema or tenderness.  Right hemiparesis. Contractures noted at wrist bilaterally  Neurological: She is alert.   Advanced dementia.   Skin: Skin is warm and dry.  Psychiatric: Her behavior is normal.    Labs reviewed: Basic Metabolic Panel:  Recent Labs  16/12/9608/03/16  NA 139  K 4.0  BUN 22*  CREATININE 0.8   Liver Function Tests:  Recent Labs  12/26/14  AST 16  ALT 12  ALKPHOS 52   No results for input(s): LIPASE, AMYLASE in the last 8760 hours. No results for input(s): AMMONIA in the last 8760 hours. CBC:  Recent Labs  12/26/14  WBC 4.9  HGB 12.0  HCT 36  PLT 167   TSH: No results for input(s): TSH in the last 8760 hours. A1C: Lab Results  Component Value Date   HGBA1C 6.5* 07/13/2012   Lipid Panel: No results for input(s): CHOL, HDL, LDLCALC, TRIG, CHOLHDL, LDLDIRECT in the last 8760 hours.   Assessment/Plan 1. FTT (failure to thrive) in adult Recently started remeron, conts to have double portion at meals and supplements. Slight weight gain noted from facility weights. Will cont current treatment but expect further decline with worsening of dementia  2. CVA, old, hemiparesis (HCC) Remains stable, conts ASA 81 mg daily  3. Vascular dementia without behavioral disturbance Advanced dementia. Requires total care by staff  4. Essential hypertension, benign Blood pressure stable conts on lisinopril     Jessica K. Biagio BorgEubanks, AGNP  [facility] [phone](Monday-Friday 8 am - 5 pm) [phone] (after hours)

## 2015-05-08 ENCOUNTER — Non-Acute Institutional Stay (SKILLED_NURSING_FACILITY): Payer: Medicare Other | Admitting: Nurse Practitioner

## 2015-05-08 ENCOUNTER — Encounter: Payer: Self-pay | Admitting: Nurse Practitioner

## 2015-05-08 DIAGNOSIS — L8992 Pressure ulcer of unspecified site, stage 2: Secondary | ICD-10-CM | POA: Diagnosis not present

## 2015-05-08 DIAGNOSIS — I10 Essential (primary) hypertension: Secondary | ICD-10-CM | POA: Diagnosis not present

## 2015-05-08 DIAGNOSIS — R627 Adult failure to thrive: Secondary | ICD-10-CM

## 2015-05-08 DIAGNOSIS — F0150 Vascular dementia without behavioral disturbance: Secondary | ICD-10-CM | POA: Diagnosis not present

## 2015-05-08 DIAGNOSIS — I69359 Hemiplegia and hemiparesis following cerebral infarction affecting unspecified side: Secondary | ICD-10-CM | POA: Diagnosis not present

## 2015-05-08 NOTE — Progress Notes (Signed)
Nursing Home Location:  [facility]   Place of Service: SNF (31)  PCP: GREEN, Lenon Curt, MD  No Known Allergies  Chief Complaint  Patient presents with  . Medical Management of Chronic Issues    Routine Visit    HPI:  Patient is a 82 y.o. female seen today at [facility] for routine follow up.  Pt with pmh of advanced dementia, CVA with RT sided hemiparesis, htn,weight loss. Pt unable to participate in ROS or HPI due to advanced dementia. Pt with ongoing pressure ulcer to sacrum. Followed by treatment nurse. Covered with duoderm. Staff reports pt eating well while being fed. No changes in mental status noted. Staff notes pt with increase discomfort in her legs when being moved.   Review of Systems:  Review of Systems  Unable to perform ROS: Dementia    Past Medical History  Diagnosis Date  . Stroke (HCC)   . Dementia   . Hypertension   . Laceration of skin of eyelid and periocular area   . Other fall   . Wheelchair dependence   . Hypopotassemia   . Encounters for unspecified administrative purpose   . Other and unspecified hyperlipidemia   . Reflux esophagitis   . Blood in stool   . Routine general medical examination at a health care facility   . Hypopotassemia   . Dementia in conditions classified elsewhere with behavioral disturbance   . Dementia in conditions classified elsewhere without behavioral disturbance   . Loss of weight   . Cough   . Unspecified vitamin D deficiency   . Thrombocytopenia, unspecified (HCC)   . Unspecified disorder of kidney and ureter   . Other and unspecified hyperlipidemia   . Senile dementia, uncomplicated   . Anxiety state, unspecified   . Unspecified essential hypertension   . Unspecified late effects of cerebrovascular disease   . Other malaise and fatigue   . Abnormality of gait   . Cerebral contusion (HCC) 05/04/2012  . Tremors of nervous system 11/11/2012   Past Surgical History  Procedure  Laterality Date  . Joint replacement  2008    Dr Renae Fickle  . Abdominal hysterectomy  1981   Social History:   reports that she has never smoked. She has never used smokeless tobacco. She reports that she does not drink alcohol or use illicit drugs.  History reviewed. No pertinent family history.  Medications: Patient's Medications  New Prescriptions   No medications on file  Previous Medications   ACETAMINOPHEN (TYLENOL) 325 MG TABLET    Take 650 mg by mouth every 6 (six) hours as needed for fever (greater than 101.0).   ACETAMINOPHEN (TYLENOL) 650 MG CR TABLET    Take 650 mg by mouth every 8 (eight) hours as needed for pain.   ALPRAZOLAM (XANAX) 0.25 MG TABLET    Take one tablet by mouth every 8 hours as needed for anixety   AMINO ACIDS-PROTEIN HYDROLYS (FEEDING SUPPLEMENT, PRO-STAT SUGAR FREE 64,) LIQD    Take 30 mLs by mouth 2 (two) times daily with a meal.   ASPIRIN 81 MG CHEWABLE TABLET    Chew 81 mg by mouth daily. For CVA   CHLORHEXIDINE (PERIDEX) 0.12 % SOLUTION    Use as directed 15 mLs in the mouth or throat 4 (four) times daily.   CHOLECALCIFEROL 2000 UNITS TABS    Take 2,000 tablets by mouth daily. For calcium supplement   LISINOPRIL (PRINIVIL,ZESTRIL) 10 MG TABLET  Take 10 mg by mouth daily. For HTN   MAGNESIUM HYDROXIDE (MILK OF MAGNESIA) 800 MG/5ML SUSPENSION    Take 30 mLs by mouth daily as needed for constipation.   MIRTAZAPINE (REMERON) 15 MG TABLET    Take 15 mg by mouth at bedtime.   MULTIPLE VITAMIN (MULTIVITAMIN) TABLET    Take 1 tablet by mouth daily.   SODIUM PHOSPHATES (RA SALINE ENEMA RE)    Place rectally as needed.  Modified Medications   No medications on file  Discontinued Medications   No medications on file     Physical Exam: Filed Vitals:   05/08/15 0958  BP: 112/66  Pulse: 74  Temp: 98 F (36.7 C)  TempSrc: Oral  Resp: 21  Height:  (1.676 m)  Weight: 90 lb (40.824 kg)    Physical Exam  Constitutional: No distress.  Thin. Frail.    HENT:  Mouth/Throat: Oropharynx is clear and moist. No oropharyngeal exudate.  Neck: Neck supple.  Cardiovascular: Normal rate, regular rhythm and normal heart sounds.   Pulmonary/Chest: Effort normal and breath sounds normal.  Abdominal: Soft. Bowel sounds are normal.  Musculoskeletal: She exhibits no edema or tenderness.  Right hemiparesis. Contractures noted at wrist bilaterally  Neurological: She is alert.   Advanced dementia.   Skin: Skin is warm and dry.  Stage 2 pressure ulcer to sacrum, using duoderm dressing  Psychiatric: Her behavior is normal.    Labs reviewed: Basic Metabolic Panel:  Recent Labs  16/10/96  NA 139  K 4.0  BUN 22*  CREATININE 0.8   Liver Function Tests:  Recent Labs  12/26/14  AST 16  ALT 12  ALKPHOS 52   No results for input(s): LIPASE, AMYLASE in the last 8760 hours. No results for input(s): AMMONIA in the last 8760 hours. CBC:  Recent Labs  12/26/14  WBC 4.9  HGB 12.0  HCT 36  PLT 167   TSH: No results for input(s): TSH in the last 8760 hours. A1C: Lab Results  Component Value Date   HGBA1C 6.5* 07/13/2012   Lipid Panel: No results for input(s): CHOL, HDL, LDLCALC, TRIG, CHOLHDL, LDLDIRECT in the last 8760 hours.   Assessment/Plan 1. CVA, old, hemiparesis (HCC) With contractures to upper and lower extremities. Will increase tylenol to TID to see if this helps pain when being moved. Staff to notify for increase pain.   2. Vascular dementia without behavioral disturbance Advanced, total care by staff, without acute decline in the last month. Cont supportive care by staff  3. Essential hypertension, benign Blood pressure stable on lisinopril 10 mg daily  4. FTT (failure to thrive) in [place] -weight has remained stable in the last month. conts on remeron to stimulate appetite and staff to feed patient at meals   5. Pressure ulcer, stage 2 -cont wound care, proper nutrition, and frequent repositioning     Tracie Sangalang K.  Biagio [name]  [name] [phone] 8 am - 5 pm) [phone] (after hours)

## 2015-06-21 ENCOUNTER — Encounter: Payer: Self-pay | Admitting: Adult Health

## 2015-06-21 ENCOUNTER — Non-Acute Institutional Stay (SKILLED_NURSING_FACILITY): Payer: Medicare Other | Admitting: Adult Health

## 2015-06-21 DIAGNOSIS — R627 Adult failure to thrive: Secondary | ICD-10-CM

## 2015-06-21 DIAGNOSIS — M2450 Contracture, unspecified joint: Secondary | ICD-10-CM | POA: Diagnosis not present

## 2015-06-21 DIAGNOSIS — R1310 Dysphagia, unspecified: Secondary | ICD-10-CM | POA: Diagnosis not present

## 2015-06-21 DIAGNOSIS — I679 Cerebrovascular disease, unspecified: Secondary | ICD-10-CM | POA: Diagnosis not present

## 2015-06-21 DIAGNOSIS — D638 Anemia in other chronic diseases classified elsewhere: Secondary | ICD-10-CM | POA: Diagnosis not present

## 2015-06-21 DIAGNOSIS — I69359 Hemiplegia and hemiparesis following cerebral infarction affecting unspecified side: Secondary | ICD-10-CM

## 2015-06-21 NOTE — Progress Notes (Signed)
Patient ID: [name], female   DOB: 02/22/1929, 82 y.o.   MRN: [id number]  Location:  [facility] Room Number: 225 B Place of Service:  SNF (31) Provider:   Peggye Leyhristy Tabrina Esty, ANP [facility] [phone]  Patient Care Team: Kimber RelicArthur G Green, MD as PCP - General (Internal Medicine) [facility] Sharon SellerJessica K Eubanks, NP as Nurse Practitioner (Nurse Practitioner)  Extended Emergency Contact Information Primary Emergency Contact: Rozycki,Samuel Address: [address]          [place], [place] [address] [place] Home Phone: [phone] Mobile Phone: [phone] Relation: Spouse Secondary Emergency Contact: Sandria Senteratum,Kenneth Address: [address]          [place], [place] [address] [place] Home Phone: [phone] Relation: Son  Code Status:  Full code Goals of care: Advanced Directive information Advanced Directives 06/21/2015  Does patient have an advance directive? No  Type of Advance Directive -  Does patient want to make changes to advanced directive? No - Patient declined  Copy of advanced directive(s) in chart? -  Would patient like information on creating an advanced directive? No - patient declined information  Pre-existing out of facility DNR order (yellow form or pink MOST form) -     Chief Complaint  Patient presents with  . Medical Management of Chronic Issues    Routine Visit    HPI:  Pt is a 82 y.o. female seen today for medical management of chronic diseases.  She has a hx of CVA with right sided hemiparesis in 2009 that has left her unable to walk and totally dependent for ADLs.  She is nonverbal and can not contribute to the hx.    Contracture of multiple joints -using tylenol for pain -wearing splints for 3-4 hrs periods   FTT (failure to thrive) in adult -weight stable but below goal on remeron -would like to get to know her and her family better and establish goals of care  (i.e. Code status and most form)  Cerebrovascular disease -on aspirin without s/e  Dysphagia -on pureed foot with double portions -no recent bouts of aspiration or choking  Anemia Stable, last H/H 12/36 in Oct of 2016    Past Medical History  Diagnosis Date  . Stroke (HCC)   . Dementia   . Hypertension   . Laceration of skin of eyelid and periocular area   . Other fall   . Wheelchair dependence   . Hypopotassemia   . Encounters for unspecified administrative purpose   . Other and unspecified hyperlipidemia   . Reflux esophagitis   . Blood in stool   . Routine general medical examination at a health care facility   . Hypopotassemia   . Dementia in conditions classified elsewhere with behavioral disturbance   . Dementia in conditions classified elsewhere without behavioral disturbance   . Loss of weight   . Cough   . Unspecified vitamin D deficiency   . Thrombocytopenia, unspecified (HCC)   . Unspecified disorder of kidney and ureter   . Other and unspecified hyperlipidemia   . Senile dementia, uncomplicated   . Anxiety state, unspecified   . Unspecified essential hypertension   . Unspecified late effects of cerebrovascular disease   . Other malaise and fatigue   . Abnormality of gait   . Cerebral contusion (HCC) 05/04/2012  . Tremors of nervous system 11/11/2012   Past Surgical History  Procedure Laterality Date  . Joint replacement  2008    Dr Renae Fickle  . Abdominal hysterectomy  1981    No Known Allergies    Medication List       This list is accurate as of: 06/21/15 10:13 AM.  Always use your most recent med list.               acetaminophen 325 MG tablet  Commonly known as:  TYLENOL  Take 650 mg by mouth every 6 (six) hours as needed for fever (greater than 101.0).     acetaminophen 650 MG CR tablet  Commonly known as:  TYLENOL  Take 650 mg by mouth every 8 (eight) hours as needed for pain.     ALPRAZolam 0.25 MG tablet  Commonly known as:  XANAX    Take one tablet by mouth every 8 hours as needed for anixety     aspirin 81 MG chewable tablet  Chew 81 mg by mouth daily. For CVA     chlorhexidine 0.12 % solution  Commonly known as:  PERIDEX  Use as directed 15 mLs in the mouth or throat 4 (four) times daily.     Cholecalciferol 2000 units Tabs  Take 2,000 tablets by mouth daily. For calcium supplement     feeding supplement (PRO-STAT SUGAR FREE 64) Liqd  Take 30 mLs by mouth 2 (two) times daily with a meal.     lisinopril 10 MG tablet  Commonly known as:  PRINIVIL,ZESTRIL  Take 10 mg by mouth daily. For HTN     magnesium hydroxide 800 MG/5ML suspension  Commonly known as:  MILK OF MAGNESIA  Take 30 mLs by mouth daily as needed for constipation.     mirtazapine 15 MG tablet  Commonly known as:  REMERON  Take 15 mg by mouth at bedtime.     multivitamin tablet  Take 1 tablet by mouth daily.     RA SALINE ENEMA RE  Place rectally as needed.        Review of Systems  Unable to perform ROS: Dementia    Immunization History  Administered Date(s) Administered  . Influenza-Unspecified 12/23/2012, 12/23/2013  . PPD Test 07/16/2014  . Pneumococcal Polysaccharide-23 07/15/2012   Pertinent  Health Maintenance Due  Topic Date Due  . INFLUENZA VACCINE  06/23/2015 (Originally 10/24/2014)  . PNA vac Low Risk Adult (2 of 2 - PCV13) 05/07/2016 (Originally 07/15/2013)  . DEXA SCAN  Completed   Fall Risk  07/08/2012  Falls in the past year? Yes  Number falls in past yr: 1  Injury with Fall? Yes   Functional Status Survey:    Filed Vitals:   06/21/15 1001  BP: 118/79  Pulse: 77  Temp: 98 F (36.7 C)  TempSrc: Oral  Resp: 21  Height:  (1.676 m)  Weight: 90 lb 3.2 oz (40.914 kg)   Body mass index is 14.57 kg/(m^2). Physical Exam  Constitutional: No distress.  Thin frail  Eyes: Conjunctivae are normal. Right eye exhibits no discharge. Left eye exhibits no discharge.  Neck: No thyromegaly present.   Cardiovascular: Normal rate and regular rhythm.   Pulmonary/Chest: Effort normal and breath sounds normal.  Abdominal: Soft. Bowel sounds are normal. She exhibits no distension.  Musculoskeletal:  Rigidity to all ext, significant contracture to the right arm  Neurological: She is alert.  Non verbal, not able to f/c  Skin: She is not diaphoretic.  Psychiatric: She has a normal mood and affect.   Wt Readings from Last 3 Encounters:  06/21/15 90  lb 3.2 oz (40.914 kg)  05/08/15 90 lb (40.824 kg)  04/07/15 90 lb 9.6 oz (41.096 kg)    Labs reviewed:  Recent Labs  12/26/14  NA 139  K 4.0  BUN 22*  CREATININE 0.8    Recent Labs  12/26/14  AST 16  ALT 12  ALKPHOS 52    Recent Labs  12/26/14  WBC 4.9  HGB 12.0  HCT 36  PLT 167   No results found for: TSH Lab Results  Component Value Date   HGBA1C 6.5* 07/13/2012   Lab Results  Component Value Date   CHOL 174 10/01/2013   CHOL 174 10/01/2013   HDL 54 10/01/2013   HDL 54 10/01/2013   LDLCALC 113 10/01/2013   LDLCALC 113 10/01/2013   TRIG 35* 10/01/2013    Significant Diagnostic Results in last 30 days:  No results found.  Assessment/Plan  1. CVA, old, hemiparesis (HCC) No new events Remains total care  Currently on restorative program due to contractures  2. Contracture of multiple joints -records indicate she is tolerating wearing splints to her upper extremities (worse on the right) -using tylenol TID for perceived pain, which is difficult to assess given her current state (mostly non verbal)  3. FTT (failure to thrive) in adult -weight is stable on remeron but certainly below goal -continue to offer double portion and monitor weight -check CMP, TSH  4. Anemia in other chronic diseases classified elsewhere -check CBC  5. Cerebrovascular disease -continue aspirin to reduce risk  -would not use a statin in this setting due to low weight and FTT  6. Dysphagia -no episodes of choking recorded in  notes, no recent bouts of pna -continue puree diet  Labs/tests ordered:  CMP CBC TSH  Peggye Ley, ANP [facility] [phone]

## 2015-06-23 LAB — HEPATIC FUNCTION PANEL
ALT: 14 U/L (ref 7–35)
AST: 9 U/L — AB (ref 13–35)
Alkaline Phosphatase: 73 U/L (ref 25–125)
Bilirubin, Total: 0.3 mg/dL

## 2015-06-23 LAB — CBC AND DIFFERENTIAL
HCT: 35 % — AB (ref 36–46)
Hemoglobin: 11.5 g/dL — AB (ref 12.0–16.0)
PLATELETS: 165 10*3/uL (ref 150–399)
WBC: 4.7 10^3/mL

## 2015-06-23 LAB — BASIC METABOLIC PANEL
BUN: 25 mg/dL — AB (ref 4–21)
CREATININE: 0.6 mg/dL (ref 0.5–1.1)
GLUCOSE: 84 mg/dL
Potassium: 4.3 mmol/L (ref 3.4–5.3)
SODIUM: 145 mmol/L (ref 137–147)

## 2015-06-23 LAB — TSH: TSH: 1.17 u[IU]/mL (ref 0.41–5.90)

## 2015-07-17 ENCOUNTER — Non-Acute Institutional Stay (SKILLED_NURSING_FACILITY): Payer: Medicare Other | Admitting: Internal Medicine

## 2015-07-17 ENCOUNTER — Encounter: Payer: Self-pay | Admitting: Internal Medicine

## 2015-07-17 DIAGNOSIS — I10 Essential (primary) hypertension: Secondary | ICD-10-CM

## 2015-07-17 DIAGNOSIS — F0150 Vascular dementia without behavioral disturbance: Secondary | ICD-10-CM

## 2015-07-17 DIAGNOSIS — R627 Adult failure to thrive: Secondary | ICD-10-CM | POA: Diagnosis not present

## 2015-07-17 DIAGNOSIS — D638 Anemia in other chronic diseases classified elsewhere: Secondary | ICD-10-CM

## 2015-07-17 DIAGNOSIS — L89151 Pressure ulcer of sacral region, stage 1: Secondary | ICD-10-CM | POA: Diagnosis not present

## 2015-07-17 DIAGNOSIS — I69359 Hemiplegia and hemiparesis following cerebral infarction affecting unspecified side: Secondary | ICD-10-CM | POA: Diagnosis not present

## 2015-07-17 NOTE — Progress Notes (Signed)
Patient ID: Tracie [name], female   DOB: 1928-05-19, 82 y.o.   MRN: [id number]    DATE: 07/17/15  Location:  [facility] Room Number: 225 B Place of Service: SNF (31)   Extended Emergency Contact Information Primary Emergency Contact: Champine,Samuel Address: [address]          [place], [place] [address] [place] Home Phone: [phone] Mobile Phone: [phone] Relation: Spouse Secondary Emergency Contact: Hemstreet,Kenneth Address: [address]          [place], [place] [address] [place] Home Phone: [phone] Relation: Son  Advanced Directive information Does patient have an advance directive?: No  Chief Complaint  Patient presents with  . Medical Management of Chronic Issues    Routine Visit    HPI:  82 yo female long term resident seen today for f/u. No nursing issues. No falls. She is a poor historian due to dementia. Hx obtained from chart  Vascular dementia/FTT - progressively declining. Weight 86 lbs -down 4 lbs since last month. Takes remeron to stimulate appetite and alprazolam prn. She gets nutritional supplements and takes MVI  HTN - BP controlled on lisinopril.   Hx CVA - she has dysphagia and multiple contractures. Takes ASA daily  Hx decubitus ulcer - stage 1. Wound care nurse following  Hyperlipidemia - diet controlled. LDL 113 in 2015  Hx anemia - stable. hgb 11.5  Past Medical History  Diagnosis Date  . Stroke (HCC)   . Dementia   . Hypertension   . Laceration of skin of eyelid and periocular area   . Other fall   . Wheelchair dependence   . Hypopotassemia   . Encounters for unspecified administrative purpose   . Other and unspecified hyperlipidemia   . Reflux esophagitis   . Blood in stool   . Routine general medical examination at a health care facility   . Hypopotassemia   . Dementia in conditions classified elsewhere with behavioral disturbance   . Dementia in conditions  classified elsewhere without behavioral disturbance   . Loss of weight   . Cough   . Unspecified vitamin D deficiency   . Thrombocytopenia, unspecified (HCC)   . Unspecified disorder of kidney and ureter   . Other and unspecified hyperlipidemia   . Senile dementia, uncomplicated   . Anxiety state, unspecified   . Unspecified essential hypertension   . Unspecified late effects of cerebrovascular disease   . Other malaise and fatigue   . Abnormality of gait   . Cerebral contusion (HCC) 05/04/2012  . Tremors of nervous system 11/11/2012    Past Surgical History  Procedure Laterality Date  . Joint replacement  2008    Dr Renae Fickle  . Abdominal hysterectomy  1981    Patient Care Team: Kimber Relic, MD as PCP - General (Internal Medicine) [facility] Sharon Seller, NP as Nurse Practitioner (Nurse Practitioner)  Social History   Social History  . Marital Status: Married    Spouse Name: N/A  . Number of Children: N/A  . Years of Education: N/A   Occupational History  . Not on file.   Social History Main Topics  . Smoking status: Never Smoker   . Smokeless tobacco: Never Used  . Alcohol Use: No  . Drug Use: No  . Sexual Activity: No   Other Topics Concern  . Not on file   Social History Narrative     reports that she  has never smoked. She has never used smokeless tobacco. She reports that she does not drink alcohol or use illicit drugs.  Immunization History  Administered Date(s) Administered  . Influenza-Unspecified 12/23/2012, 12/23/2013  . PPD Test 07/16/2014  . Pneumococcal Polysaccharide-23 07/15/2012    No Known Allergies  Medications: Patient's Medications  New Prescriptions   No medications on file  Previous Medications   ACETAMINOPHEN (TYLENOL) 325 MG TABLET    Take 650 mg by mouth every 6 (six) hours as needed for fever (greater than 101.0).   ACETAMINOPHEN (TYLENOL) 650 MG CR TABLET    Take 650 mg by mouth every 8 (eight) hours as  needed for pain.   ALPRAZOLAM (XANAX) 0.25 MG TABLET    Take one tablet by mouth every 8 hours as needed for anixety   AMINO ACIDS-PROTEIN HYDROLYS (FEEDING SUPPLEMENT, PRO-STAT SUGAR FREE 64,) LIQD    Take 30 mLs by mouth 2 (two) times daily with a meal.   ASPIRIN 81 MG CHEWABLE TABLET    Chew 81 mg by mouth daily. For CVA   CHLORHEXIDINE (PERIDEX) 0.12 % SOLUTION    Use as directed 15 mLs in the mouth or throat 4 (four) times daily.   CHOLECALCIFEROL 2000 UNITS TABS    Take 2,000 tablets by mouth daily. For calcium supplement   LISINOPRIL (PRINIVIL,ZESTRIL) 10 MG TABLET    Take 10 mg by mouth daily. For HTN   MAGNESIUM HYDROXIDE (MILK OF MAGNESIA) 800 MG/5ML SUSPENSION    Take 30 mLs by mouth daily as needed for constipation.   MIRTAZAPINE (REMERON) 15 MG TABLET    Take 15 mg by mouth at bedtime.   MULTIPLE VITAMIN (MULTIVITAMIN) TABLET    Take 1 tablet by mouth daily.   SODIUM PHOSPHATES (RA SALINE ENEMA RE)    Place rectally as needed.  Modified Medications   No medications on file  Discontinued Medications   No medications on file    Review of Systems  Unable to perform ROS: Dementia    Filed Vitals:   07/17/15 0937  BP: 118/79  Pulse: 77  Temp: 98 F (36.7 C)  TempSrc: Oral  Resp: 21  Height:  (1.676 m)  Weight: 86 lb 9.6 oz (39.282 kg)   Body mass index is 13.98 kg/(m^2).  Physical Exam  Constitutional:  Frail appearing in NAD, lying in bed resting but easily awakened, temporal wasting  HENT:  Mouth/Throat: Oropharynx is clear and moist. No oropharyngeal exudate.  Poor dentition  Eyes: Pupils are equal, round, and reactive to light. No scleral icterus.  Neck: Neck supple. Carotid bruit is not present. No tracheal deviation present.  Cardiovascular: Normal rate, regular rhythm and intact distal pulses.  Exam reveals no gallop and no friction rub.   Murmur (1/6 SEM) heard. No LE edema b/l. no calf TTP.   Pulmonary/Chest: Effort normal and breath sounds normal.  No stridor. No respiratory distress. She has no wheezes. She has no rales.  Poor inspiratory effort  Abdominal: Soft. Bowel sounds are normal. She exhibits no distension and no mass. There is no hepatomegaly. There is no tenderness. There is no rebound and no guarding.  Musculoskeletal: She exhibits edema.  RUE flexion contracture; multiple small and large joint deformities  Lymphadenopathy:    She has no cervical adenopathy.  Neurological: She is alert.  Skin: Skin is warm and dry. No rash noted.  Right heel and coccyx superficial wounds per wound care tx nurse. No secondary signs of infection  Psychiatric: She  has a normal mood and affect. Her behavior is normal. She is noncommunicative.     Labs reviewed: Nursing Home on 07/17/2015  Component Date Value Ref Range Status  . ALT 06/23/2015 14  7 - 35 U/L Final  . AST 06/23/2015 9* 13 - 35 U/L Final  . TSH 06/23/2015 1.17  0.41 - 5.90 uIU/mL Final  . Hemoglobin 06/23/2015 11.5* 12.0 - 16.0 g/dL Final  . HCT 29/56/213003/31/2017 35* 36 - 46 % Final  . Platelets 06/23/2015 165  150 - 399 K/L Final  . WBC 06/23/2015 4.7   Final  . Glucose 06/23/2015 84   Final  . BUN 06/23/2015 25* 4 - 21 mg/dL Final  . Creatinine 86/57/846903/31/2017 0.6  0.5 - 1.1 mg/dL Final  . Potassium 62/95/284103/31/2017 4.3  3.4 - 5.3 mmol/L Final  . Sodium 06/23/2015 145  137 - 147 mmol/L Final  . Alkaline Phosphatase 06/23/2015 73  25 - 125 U/L Final  . Bilirubin, Total 06/23/2015 0.3   Final    No results found.   Assessment/Plan   ICD-9-CM ICD-10-CM   1. FTT (failure to thrive) in adult 783.7 R62.7   2. Vascular dementia without behavioral disturbance 290.40 F01.50    end stage  3. Decubitus ulcer of sacral region, stage 1 707.03 L89.151    707.21    4. Essential hypertension, benign 401.1 I10   5. CVA, old, hemiparesis (HCC) 438.20 I69.359   6. Anemia in other chronic diseases classified elsewhere 285.29 D63.8    Cont wound care as ordered  Pt is medically stable on  current tx plan. Continue current medications as ordered. PT/OT/ST as indicated. Will follow  Preslie Depasquale S. Ancil Linseyarter, D. O., F. A. C. O. I.  [facility] [address] [place], [place] [address] [phone] Cell (Monday-Friday 8 AM - 5 PM) [phone] After 5 PM and follow prompts

## 2015-08-14 ENCOUNTER — Non-Acute Institutional Stay (SKILLED_NURSING_FACILITY): Payer: Medicare Other | Admitting: Internal Medicine

## 2015-08-14 ENCOUNTER — Encounter: Payer: Self-pay | Admitting: Internal Medicine

## 2015-08-14 DIAGNOSIS — F0150 Vascular dementia without behavioral disturbance: Secondary | ICD-10-CM

## 2015-08-14 DIAGNOSIS — I10 Essential (primary) hypertension: Secondary | ICD-10-CM | POA: Diagnosis not present

## 2015-08-14 DIAGNOSIS — D638 Anemia in other chronic diseases classified elsewhere: Secondary | ICD-10-CM

## 2015-08-14 DIAGNOSIS — M2450 Contracture, unspecified joint: Secondary | ICD-10-CM

## 2015-08-14 DIAGNOSIS — I69359 Hemiplegia and hemiparesis following cerebral infarction affecting unspecified side: Secondary | ICD-10-CM | POA: Diagnosis not present

## 2015-08-14 DIAGNOSIS — L89152 Pressure ulcer of sacral region, stage 2: Secondary | ICD-10-CM

## 2015-08-14 DIAGNOSIS — L97412 Non-pressure chronic ulcer of right heel and midfoot with fat layer exposed: Secondary | ICD-10-CM | POA: Diagnosis not present

## 2015-08-14 DIAGNOSIS — R627 Adult failure to thrive: Secondary | ICD-10-CM | POA: Diagnosis not present

## 2015-08-14 NOTE — Progress Notes (Signed)
DATE: 08/14/15  Location:  [facility]  Nursing Home Room Number: 225 B Place of Service: SNF (31)   Extended Emergency Contact Information Primary Emergency Contact: Grauberger,Samuel Address: [address]          [place], [place] [address] [place] Home Phone: [phone] Mobile Phone: [phone] Relation: Spouse Secondary Emergency Contact: Sandria Senter Address: [address]          [place], [place] [address] [place] Home Phone: [phone] Relation: Son  Advanced Directive information Type of Advance Directive: Out of facility DNR (pink MOST or yellow form), Does patient want to make changes to advanced directive?: No - Patient declined  Chief Complaint  Patient presents with  . Medical Management of Chronic Issues    Routine Visit    HPI:  82  yo female long term resident seen today for f/u. Nursing reports change in condition of pt. She has a right heel ulcer and coccyx ulcer, both stage 2. The coccyx ulcer is becoming necrotic. Wound care physician to see pt today. She has been pocketing food in her mouth for the last few days. ST will be seeing pt. Her po intake has decreased. No other concerns. Pt is a poor historian due to dementia. Hx obtained from chart and nursing.  Vascular dementia/FTT - progressively declining and likely end stage. Weight 87 lbs -down 3 lbs since Feb 2017. Takes remeron to stimulate appetite and has not req'd alprazolam prn. She gets nutritional supplements and takes . Albumin 3.7  HTN - BP controlled on lisinopril.   Hx CVA - she has dysphagia and multiple contractures. Takes ASA daily  Hx decubitus ulcer - stage 1. Wound care nurse following  Hyperlipidemia - diet controlled. LDL 113 in 2015  Hx anemia - stable. hgb 11.5  Past Medical History  Diagnosis Date  . Stroke (HCC)   . Dementia   . Hypertension   . Laceration of skin of eyelid and periocular area   . Other fall   .  Wheelchair dependence   . Hypopotassemia   . Encounters for unspecified administrative purpose   . Other and unspecified hyperlipidemia   . Reflux esophagitis   . Blood in stool   . Routine general medical examination at a health care facility   . Hypopotassemia   . Dementia in conditions classified elsewhere with behavioral disturbance   . Dementia in conditions classified elsewhere without behavioral disturbance   . Loss of weight   . Cough   . Unspecified vitamin D deficiency   . Thrombocytopenia, unspecified (HCC)   . Unspecified disorder of kidney and ureter   . Other and unspecified hyperlipidemia   . Senile dementia, uncomplicated   . Anxiety state, unspecified   . Unspecified essential hypertension   . Unspecified late effects of cerebrovascular disease   . Other malaise and fatigue   . Abnormality of gait   . Cerebral contusion (HCC) 05/04/2012  . Tremors of nervous system 11/11/2012    Past Surgical History  Procedure Laterality Date  . Joint replacement  2008    Dr Renae Fickle  . Abdominal hysterectomy  1981    Patient Care Team: Kimber Relic, MD as PCP - General (Internal Medicine) [facility] Sharon Seller, NP as Nurse Practitioner (Nurse Practitioner)  Social History   Social History  . Marital Status: Married    Spouse Name: N/A  . Number of Children: N/A  . Years of  Education: N/A   Occupational History  . Not on file.   Social History Main Topics  . Smoking status: Never Smoker   . Smokeless tobacco: Never Used  . Alcohol Use: No  . Drug Use: No  . Sexual Activity: No   Other Topics Concern  . Not on file   Social History Narrative     reports that she has never smoked. She has never used smokeless tobacco. She reports that she does not drink alcohol or use illicit drugs.  Immunization History  Administered Date(s) Administered  . Influenza-Unspecified 12/23/2012, 12/23/2013  . PPD Test 07/16/2014  . Pneumococcal  Polysaccharide-23 07/15/2012    No Known Allergies  Medications: Patient's Medications  New Prescriptions   No medications on file  Previous Medications   ACETAMINOPHEN (TYLENOL) 325 MG TABLET    Take 650 mg by mouth every 6 (six) hours as needed for fever (greater than 101.0).   ACETAMINOPHEN (TYLENOL) 650 MG CR TABLET    Take 650 mg by mouth every 8 (eight) hours as needed for pain.   ALPRAZOLAM (XANAX) 0.25 MG TABLET    Take one tablet by mouth every 8 hours as needed for anixety   AMBULATORY NON FORMULARY MEDICATION    Magic cup two times daily   AMBULATORY NON FORMULARY MEDICATION    Medpass 120 mL three times daily   AMINO ACIDS-PROTEIN HYDROLYS (FEEDING SUPPLEMENT, PRO-STAT SUGAR FREE 64,) LIQD    Take 30 mLs by mouth 2 (two) times daily with a meal.   ASPIRIN 81 MG CHEWABLE TABLET    Chew 81 mg by mouth daily. For CVA   CHLORHEXIDINE (PERIDEX) 0.12 % SOLUTION    Use as directed 15 mLs in the mouth or throat 4 (four) times daily.   CHOLECALCIFEROL 2000 UNITS TABS    Take 2,000 tablets by mouth daily. For calcium supplement   LISINOPRIL (PRINIVIL,ZESTRIL) 10 MG TABLET    Take 10 mg by mouth daily. For HTN   MAGNESIUM HYDROXIDE (MILK OF MAGNESIA) 800 MG/5ML SUSPENSION    Take 30 mLs by mouth daily as needed for constipation.   MIRTAZAPINE (REMERON) 15 MG TABLET    Take 15 mg by mouth at bedtime.   MULTIPLE VITAMIN (MULTIVITAMIN) TABLET    Take 1 tablet by mouth daily.   SODIUM PHOSPHATES (RA SALINE ENEMA RE)    Place rectally as needed.  Modified Medications   No medications on file  Discontinued Medications   No medications on file    Review of Systems  Unable to perform ROS: Dementia    Filed Vitals:   08/14/15 1202  BP: 118/79  Pulse: 77  Temp: 98 F (36.7 C)  TempSrc: Oral  Resp: 18  Height:  (1.676 m)  Weight: 87 lb 9.6 oz (39.735 kg)   Body mass index is 14.15 kg/(m^2).  Physical Exam  Constitutional:  Frail appearing in NAD, lying in bed resting but  easily awakened, temporal wasting  HENT:  Mouth/Throat: Oropharynx is clear and moist. No oropharyngeal exudate.  Poor dentition; MMM  Eyes: Pupils are equal, round, and reactive to light. No scleral icterus.  Neck: Neck supple. Carotid bruit is not present. No tracheal deviation present.  Cardiovascular: Normal rate, regular rhythm and intact distal pulses.  Exam reveals no gallop and no friction rub.   Murmur (1/6 SEM) heard. No LE edema b/l. no calf TTP.   Pulmonary/Chest: Effort normal and breath sounds normal. No stridor. No respiratory distress. She has no wheezes.  She has no rales.  Poor inspiratory effort  Abdominal: Soft. Bowel sounds are normal. She exhibits no distension and no mass. There is no hepatomegaly. There is no tenderness. There is no rebound and no guarding.  Musculoskeletal: She exhibits edema.  RUE flexion contracture; multiple small and large joint deformities  Lymphadenopathy:    She has no cervical adenopathy.  Neurological: She is alert.  Skin: Skin is warm and dry. No rash noted.  Right heel and coccyx stage 2  wounds per wound care tx nurse. Coccyx wound showing signs of necrosis per wound care nurse. B/l prevalon boots intact  Psychiatric: She has a normal mood and affect. Her behavior is normal. She is noncommunicative.     Labs reviewed: Nursing Home on 07/17/2015  Component Date Value Ref Range Status  . ALT 06/23/2015 14  7 - 35 U/L Final  . AST 06/23/2015 9* 13 - 35 U/L Final  . TSH 06/23/2015 1.17  0.41 - 5.90 uIU/mL Final  . Hemoglobin 06/23/2015 11.5* 12.0 - 16.0 g/dL Final  . HCT 16/10/960403/31/2017 35* 36 - 46 % Final  . Platelets 06/23/2015 165  150 - 399 K/L Final  . WBC 06/23/2015 4.7   Final  . Glucose 06/23/2015 84   Final  . BUN 06/23/2015 25* 4 - 21 mg/dL Final  . Creatinine 54/09/811903/31/2017 0.6  0.5 - 1.1 mg/dL Final  . Potassium 14/78/295603/31/2017 4.3  3.4 - 5.3 mmol/L Final  . Sodium 06/23/2015 145  137 - 147 mmol/L Final  . Alkaline Phosphatase  06/23/2015 73  25 - 125 U/L Final  . Bilirubin, Total 06/23/2015 0.3   Final    No results found.   Assessment/Plan    ICD-9-CM ICD-10-CM   1. Vascular dementia without behavioral disturbance 290.40 F01.50   2. FTT (failure to thrive) in adult 783.7 R62.7   3. CVA, old, hemiparesis (HCC) 438.20 I69.359   4. Sacral ulcer, stage II 707.03 L89.152    707.22     with necrosis  5. Ulcer of right heel, with fat layer exposed (HCC) 707.14 L97.412   6. Essential hypertension, benign 401.1 I10   7. Contracture of multiple joints 718.49 M24.50   8. Anemia in other chronic diseases classified elsewhere 285.29 D63.8     Discussed her decline with family (2 daughters, including Archie Pattenonya, and 1 son) at bedside. Agreeable to consult palliative care for end stage dementia.   Wound care as ordered  Cont ST as ordered  T/c prealbumin level  D/c prn xanax as she has not used it in > 90 days  Cont other meds as ordered  Will follow  Jimia Gentles S. Ancil Linseyarter, D. O., F. A. C. O. I.  [facility] [address] [place], [place] [address] [phone] Cell (Monday-Friday 8 AM - 5 PM) [phone] After 5 PM and follow prompts

## 2015-09-23 DEATH — deceased
